# Patient Record
Sex: Male | Born: 1950 | Race: White | Hispanic: No | Marital: Married | Smoking: Never smoker
Health system: Southern US, Community
[De-identification: ages and names within clinical notes are randomized; demographics above are authoritative.]

## PROBLEM LIST (undated history)

## (undated) DIAGNOSIS — D509 Iron deficiency anemia, unspecified: Secondary | ICD-10-CM

## (undated) DIAGNOSIS — Z9889 Other specified postprocedural states: Secondary | ICD-10-CM

## (undated) DIAGNOSIS — R152 Fecal urgency: Secondary | ICD-10-CM

## (undated) DIAGNOSIS — D6862 Lupus anticoagulant syndrome: Secondary | ICD-10-CM

## (undated) DIAGNOSIS — I10 Essential (primary) hypertension: Secondary | ICD-10-CM

## (undated) DIAGNOSIS — M25619 Stiffness of unspecified shoulder, not elsewhere classified: Secondary | ICD-10-CM

## (undated) DIAGNOSIS — R112 Nausea with vomiting, unspecified: Secondary | ICD-10-CM

## (undated) DIAGNOSIS — K5010 Crohn's disease of large intestine without complications: Secondary | ICD-10-CM

## (undated) DIAGNOSIS — R194 Change in bowel habit: Secondary | ICD-10-CM

## (undated) DIAGNOSIS — M75100 Unspecified rotator cuff tear or rupture of unspecified shoulder, not specified as traumatic: Secondary | ICD-10-CM

## (undated) DIAGNOSIS — Z8739 Personal history of other diseases of the musculoskeletal system and connective tissue: Secondary | ICD-10-CM

## (undated) DIAGNOSIS — I82409 Acute embolism and thrombosis of unspecified deep veins of unspecified lower extremity: Secondary | ICD-10-CM

## (undated) DIAGNOSIS — K219 Gastro-esophageal reflux disease without esophagitis: Secondary | ICD-10-CM

## (undated) DIAGNOSIS — M1990 Unspecified osteoarthritis, unspecified site: Secondary | ICD-10-CM

## (undated) DIAGNOSIS — L309 Dermatitis, unspecified: Secondary | ICD-10-CM

## (undated) DIAGNOSIS — N200 Calculus of kidney: Secondary | ICD-10-CM

## (undated) DIAGNOSIS — J189 Pneumonia, unspecified organism: Secondary | ICD-10-CM

## (undated) DIAGNOSIS — M069 Rheumatoid arthritis, unspecified: Secondary | ICD-10-CM

## (undated) HISTORY — PX: JOINT REPLACEMENT: SHX530

## (undated) HISTORY — PX: COLECTOMY: SHX59

## (undated) HISTORY — PX: COLONOSCOPY: SHX174

## (undated) HISTORY — DX: Acute embolism and thrombosis of unspecified deep veins of unspecified lower extremity: I82.409

## (undated) HISTORY — PX: ESOPHAGOGASTRODUODENOSCOPY: SHX1529

## (undated) HISTORY — PX: THYROID SURGERY: SHX805

## (undated) HISTORY — DX: Lupus anticoagulant syndrome: D68.62

## (undated) HISTORY — PX: CYSTOSCOPY: SUR368

## (undated) HISTORY — PX: TRANSURETHRAL RESECTION OF PROSTATE: SHX73

## (undated) HISTORY — DX: Calculus of kidney: N20.0

---

## 1997-04-23 ENCOUNTER — Encounter: Admission: RE | Admit: 1997-04-23 | Discharge: 1997-07-22 | Payer: Self-pay

## 1997-06-09 ENCOUNTER — Inpatient Hospital Stay (HOSPITAL_COMMUNITY): Admission: AD | Admit: 1997-06-09 | Discharge: 1997-06-11 | Payer: Self-pay | Admitting: Internal Medicine

## 1997-06-10 ENCOUNTER — Other Ambulatory Visit: Admission: RE | Admit: 1997-06-10 | Discharge: 1997-06-10 | Payer: Self-pay | Admitting: Internal Medicine

## 1999-08-28 ENCOUNTER — Inpatient Hospital Stay (HOSPITAL_COMMUNITY): Admission: EM | Admit: 1999-08-28 | Discharge: 1999-08-31 | Payer: Self-pay | Admitting: Emergency Medicine

## 1999-08-28 ENCOUNTER — Encounter: Payer: Self-pay | Admitting: Emergency Medicine

## 1999-08-29 ENCOUNTER — Encounter: Payer: Self-pay | Admitting: Pediatrics

## 2001-06-16 ENCOUNTER — Emergency Department (HOSPITAL_COMMUNITY): Admission: EM | Admit: 2001-06-16 | Discharge: 2001-06-16 | Payer: Self-pay | Admitting: *Deleted

## 2004-12-31 ENCOUNTER — Inpatient Hospital Stay (HOSPITAL_COMMUNITY): Admission: EM | Admit: 2004-12-31 | Discharge: 2005-01-03 | Payer: Self-pay | Admitting: Emergency Medicine

## 2006-02-26 ENCOUNTER — Emergency Department (HOSPITAL_COMMUNITY): Admission: EM | Admit: 2006-02-26 | Discharge: 2006-02-26 | Payer: Self-pay | Admitting: Emergency Medicine

## 2009-01-11 HISTORY — PX: TOTAL KNEE ARTHROPLASTY: SHX125

## 2009-12-22 ENCOUNTER — Inpatient Hospital Stay (HOSPITAL_COMMUNITY)
Admission: RE | Admit: 2009-12-22 | Discharge: 2009-12-24 | Payer: Self-pay | Source: Home / Self Care | Attending: Orthopedic Surgery | Admitting: Orthopedic Surgery

## 2010-03-23 LAB — CBC
HCT: 36.3 % — ABNORMAL LOW (ref 39.0–52.0)
Hemoglobin: 8.4 g/dL — ABNORMAL LOW (ref 13.0–17.0)
Hemoglobin: 9.2 g/dL — ABNORMAL LOW (ref 13.0–17.0)
MCH: 31.9 pg (ref 26.0–34.0)
MCH: 32.4 pg (ref 26.0–34.0)
MCV: 90.5 fL (ref 78.0–100.0)
MCV: 92.8 fL (ref 78.0–100.0)
Platelets: 178 10*3/uL (ref 150–400)
Platelets: 182 10*3/uL (ref 150–400)
Platelets: 225 10*3/uL (ref 150–400)
RBC: 2.65 MIL/uL — ABNORMAL LOW (ref 4.22–5.81)
RBC: 2.88 MIL/uL — ABNORMAL LOW (ref 4.22–5.81)
RBC: 4.01 MIL/uL — ABNORMAL LOW (ref 4.22–5.81)
WBC: 11.1 10*3/uL — ABNORMAL HIGH (ref 4.0–10.5)
WBC: 6.1 10*3/uL (ref 4.0–10.5)
WBC: 8 10*3/uL (ref 4.0–10.5)

## 2010-03-23 LAB — BASIC METABOLIC PANEL
BUN: 16 mg/dL (ref 6–23)
CO2: 28 mEq/L (ref 19–32)
CO2: 29 mEq/L (ref 19–32)
Calcium: 8 mg/dL — ABNORMAL LOW (ref 8.4–10.5)
Chloride: 106 mEq/L (ref 96–112)
Chloride: 106 mEq/L (ref 96–112)
Creatinine, Ser: 0.96 mg/dL (ref 0.4–1.5)
Creatinine, Ser: 0.99 mg/dL (ref 0.4–1.5)
Creatinine, Ser: 1 mg/dL (ref 0.4–1.5)
GFR calc Af Amer: >60 mL/min (ref >60)
GFR calc Af Amer: >60 mL/min (ref >60)
GFR calc Af Amer: >60 mL/min (ref >60)
GFR calc non Af Amer: >60 mL/min (ref >60)
Potassium: 4.1 mEq/L (ref 3.5–5.1)
Sodium: 138 mEq/L (ref 135–145)
Sodium: 138 mEq/L (ref 135–145)

## 2010-03-23 LAB — URINALYSIS, ROUTINE W REFLEX MICROSCOPIC
Bilirubin Urine: NEGATIVE
Glucose, UA: NEGATIVE mg/dL
Hgb urine dipstick: NEGATIVE
Ketones, ur: NEGATIVE mg/dL
Nitrite: NEGATIVE
Protein, ur: NEGATIVE mg/dL
Specific Gravity, Urine: 1.013 (ref 1.005–1.030)
Urobilinogen, UA: 0.2 mg/dL (ref 0.0–1.0)
pH: 6 (ref 5.0–8.0)

## 2010-03-23 LAB — TYPE AND SCREEN
ABO/RH(D): B NEG
Antibody Screen: NEGATIVE

## 2010-03-23 LAB — DIFFERENTIAL
Eosinophils Absolute: 0.1 10*3/uL (ref 0.0–0.7)
Eosinophils Relative: 2 % (ref 0–5)
Lymphocytes Relative: 32 % (ref 12–46)
Lymphs Abs: 2 10*3/uL (ref 0.7–4.0)
Monocytes Absolute: 0.5 10*3/uL (ref 0.1–1.0)
Monocytes Relative: 7 % (ref 3–12)

## 2010-03-23 LAB — SURGICAL PCR SCREEN
MRSA, PCR: NEGATIVE
Staphylococcus aureus: POSITIVE — AB

## 2010-03-23 LAB — ABO/RH: ABO/RH(D): B NEG

## 2010-05-29 NOTE — Consult Note (Signed)
[facility]  Patient:    ZARED, KNOTH                          MRN: [id number] Proc. Date: 08/29/99 Adm. Date:  [id number] Attending:  Thedore Mins CC:         Helene Kelp, M.D.             Trudee Kuster, M.D.                          Consultation Report  CHIEF COMPLAINT:  Syncope.  HISTORY OF THE PRESENT CONDITION:  Mr. Costilla is a 60 year old right-handed married Caucasian gentleman who had an episode at dinner tonight of syncope. He said that he felt sweaty prior to this, was weak, with pallor, had a feeling of stiffening of his body and then lost consciousness.  A nurse at the table felt that his right face may have drooped and his eyes briefly deviated to the right.  After 20 to 30 seconds, the patient began to arouse.  This was followed by emesis.  The patient had had three episodes of diarrhea during the day but had not had voluminous stools and no emesis.  He had had a small amount of alcohol with dinner.  The patient did not have chest pain, shortness of breath, headache, nor did he have any neurologic signs and symptoms, either preceding or following the syncopal episode.  Patient recalls three episodes over the past couple of years; one occurred while he was in a movie theatre eating popcorn.  He felt sweaty and got up and got something to drink, felt light-headed and had to sit down but says that he did not pass out.  Another occurred when he was at a wedding in May of last year where he was profusely sweaty and hot and was standing.  He sat down and his symptoms passed.  There have been one or two other episodes; these have not been evaluated.  Patient has not had any chest pain.  He has not had intercurrent infections in the head, neck, lungs, GI or GU.  His appetite has been good and he has been sleeping well.  He has had no closed head injuries.  He has not had ongoing diarrhea or vomiting, urinary tract infections or  hematuria, hematemesis, melena or hematochezia.  Neurologically, he has had no weakness in his arms and legs, no numbness, no problems with swallowing, no double vision or change in vision or change in his hearing.  PAST MEDICAL HISTORY:  Remarkable for hypertension.  PAST SURGICAL HISTORY:  Negative.  CURRENT MEDICATIONS 1. Accupril 40 mg per day. 2. Cardura 4 mg per day. 3. He is also on potassium and a diuretic, whose names he does not recall.  ALLERGIES TO MEDICINES:  None known.  FAMILY HISTORY:  Patients father is age 20 and mother, age 65, fairly well. He has three brothers and one sister who are alive and well.  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  He does not have to load or unload the steel.  He is married.  His wife works for the psychiatric action team at [employer].  He has no children.  PHYSICAL EXAMINATION  GENERAL:  On examination today, this is a very pleasant gentleman, awake, alert and cooperative.  VITAL SIGNS:  Blood pressure 110/70, resting pulse  80, respirations 16.  He is afebrile.  HEENT/NECK:  No signs of infection.  Supple neck.  Full range of motion.  No cranial or cervical bruits.  LUNGS:  Clear to auscultation.  HEART:  No murmurs.  Pulses normal.  ABDOMEN:  Soft, nontender.  Bowel sounds normal.  EXTREMITIES:  Well-formed without edema, cyanosis, alterations in tone or tight heel cords.  NEUROLOGIC:  Mental status:  Awake, alert, attentive and appropriate.  No dysphagia or dyspraxia.  Cranial nerve examination:  Round reactive pupils. Normal fundi.  Full visual fields to double simultaneous stimuli.  Extraocular movements full and conjugate, okay and responses equal bilaterally.  Symmetric facial strength and sensation.  Air conduction greater than bone conduction bilaterally.  Motor examination:  Normal strength, tone and mass.  Good fine motor movements.  No pronator drift.   Sensation intact to cold, vibration and stereoagnosis.  Cerebellar examination:  Good finger-to-nose and rapid repetitive movements. No tremor, dystaxia, dysmetria.  Gait and station were normal.  IMPRESSION 1. Syncope, 780.2.  Patient may have had a left brain transient ischemic    attack with the right facial droop, although I could not explain the eyes    going to the right at that time.  It is possible that he had a seizure with    stiffening and that the facial droop was actually a grimace.  He could have    had an episode of neurocardiogenic syncope; this episode seems most    consistent with that, in that he had the premonitory warning of dizziness    and some nausea prior to losing consciousness. 2. Hypertension without congestive heart failure, 404.10.  I entirely agree with Dr. Phylliss Bob about this patients workup.  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  His lumbar puncture was negative. Nonetheless, I have to wonder whether or not he could have had a mild meningoencephalitis that now is causing problem ______ .  PLAN 1. MRI of brain without and with contrast. 2. MR angiography, intracranial. 3. EEG. 4. If the MRI suggests the presence of stroke, then the patient will need a    2-D echocardiogram and a carotid Doppler.  Syncope also needs to be    evaluated. Telemetry may help [place] with the prolonged Q-T interval; if not,    we may need to consider stress test and also look at a tilt table test for    possible neurocardiogenic syncope.  Patient will be seen by    Dr. Madaline Savage, who will decide if these are necessary or not.  I appreciate the opportunity to see the patient.  If you have questions or I can be of assistance, do not hesitate to contact me. Dr. Gustavus Messing. Orlin Hilding will see the patient for me this weekend and I will see him back on Monday.  The patient can have the  majority of his workup carried out as an outpatient,  depending upon his clinical situation.  I appreciate the opportunity to see him. DD:  08/29/99 TD:  08/30/99 Job: [id number]

## 2010-05-29 NOTE — Discharge Summary (Signed)
[facility]  Patient:    Dodson Dodson                          MRN: [id number] Adm. Date:  [id number] Disc. Date: [id number] Attending:  Thedore Mins                           Discharge Summary  DISCHARGE DIAGNOSES: 1. Syncope. 2. Hypertension. 3. Mild anemia.  HISTORY OF PRESENT ILLNESS:  Please see dictated history and physical per Dr. Phylliss Bob.  CONSULTANTS:  Neurology.  PROCEDURES:  MRI of the brain which was within normal limits.  HOSPITAL COURSE: #1 - SYNCOPE:  [name] had a syncopal episode, diaphoresis, weakness, and passing out.  This was witnessed by an ER nurse.  The patient did have an episode of emesis and was brought to the ER.  When evaluated in the emergency department, the patient had no focal neurologic deficits.  The patient had a CT that was negative.  His symptoms seemed to resolve and did not recur. Neurology, Dr. Ellison Carwin, was consulted.  MRI was ordered, and the patient had normal results.  The patients symptoms did not recur, so it is difficult to say if the patient had a vasovagal episode, a TIA, or simple faint.  The patient had no evidence of arrhythmia during his hospitalization. The patient was discharged to home in an improved condition.  It was felt that the patient could have further work-up as an outpatient.  #2 - HYPERTENSION:  The patient had moderately decreased blood pressure on admission.  The patient was on several blood pressure medicines.  He was on Accupril and Cardura.  Because his blood pressure was within normal limits while his medications were being held, it was decided to only give 1/2 the patients normal dose of Cardura.  CONDITION ON DISCHARGE:  Improved.  FOLLOW-UP:  The patient does not have a primary M.D., so he was given a Cardizem of Dr. Antonietta Breach with instructions to call for an appointment.  DISCHARGE MEDICATIONS: 1. Accupril 40 mg q.d. 2. HCTZ 12.5 mg q.d. 3. Kay Ciel 20  mEq q.d. 4. Aspirin 325 mg q.d. 5. Cardura 4 mg tablets 1/2 q.d. until evaluated by his primary doctor.  Discharge hemoglobin 13.0. DD:  09/21/99 TD:  09/21/99 Job: [id number]

## 2010-05-29 NOTE — H&P (Signed)
Frank Dodson, Frank Dodson NO.:  [id number]   MEDICAL RECORD NO.:  [id number]          PATIENT TYPE:  EMS   LOCATION:  MAJO                         FACILITY:  [facility]   PHYSICIAN:  Jonna L. Robb Matar, M.D.DATE OF BIRTH:  10-Apr-1950   DATE OF ADMISSION:  12/31/2004  DATE OF DISCHARGE:                                HISTORY & PHYSICAL   PRIMARY CARE PHYSICIAN:  Dr. Janey Greaser at [facility].  He is unassigned to [place].   CHIEF COMPLAINT:  Chest pain.   HISTORY:  This 60 year old Caucasian male got flu-like symptoms 4 days ago  with low-grade aches, chills, fever intermittently, and he was taking over-  the-counter medication for it, and continued to take his regular medicine.  Today, he started to get wheezing, chest tightness, increased shortness of  breath, cough __________ nonproductive, and he came to the emergency room.   ALLERGIES:  None.   MEDICATIONS:  1.  Potassium 20.  2.  Doxazosin 4.  3.  Protonix.  4.  Clinoretic 20/25.   PAST MEDICAL HISTORY:  1.  Hypertension.  2.  Gastroesophageal reflux disease.  3.  Dislocation of the left shoulder.   OPERATIONS:  Surgery on a cyst in his neck at ages 16, 74 and 69.   FAMILY HISTORY:  Hypertension, heart disease.   SOCIAL HISTORY:  Nonsmoker, no drugs.  Drinks a 12-pack a week.  Married.  No children.  Works as a Animator route.   REVIEW OF SYSTEMS:  He has a sore throat, chest tightness.  He had  peripheral edema prior to being treated for his hypertension.  All other  systems were reviewed and were negative.   PHYSICAL EXAMINATION:  VITAL SIGNS:  Temperature 100.4, pulse 130 to 114,  respirations 22, blood pressure 108/67 and went down to 89/53, 91%  saturation.  Blood pressure is back up now to 114/80.  GENERAL:  Well-developed Caucasian male in mild respiratory distress.  HEENT:  Conjunctivae and lids are normal.  Extraocular movements are full.  Normal hearing, mucosa  and pharynx.  NECK:  Supple without masses, thyromegaly or carotid bruits.  There is a  scar.  LUNGS:  Slightly increased respiratory effort.  The lungs had a few  scattered expiratory wheezes.  No dullness.  HEART:  Regular rate and rhythm.  Tachycardic.  Normal S1 and S2 without  murmurs, rubs, or gallops.  There was no clubbing, cyanosis, or edema.  ABDOMEN:  Nontender.  Normal bowel sounds.  No hepatosplenomegaly right  hernia.  GENITOURINARY:  Normal external scrotum and penis.  No adenopathy.  NEUROLOGIC:  Muscle strength is 5/5, with full range of motion in all 4  extremities.  SKIN:  No rashes, lesions or nodules.  CARDIOVASCULAR:  Intact.  EXTREMITIES:  DTRs are 2+.  Normal sensation.  Alert and oriented x3.  Normal memory, judgment and affect.   LABORATORY DATA:  Chest x-ray shows chronic peribronchitic changes.  No  congestive heart failure or infiltrate.  BUN was 25, creatinine up to 3.1,  potassium 3.5, white count of  6.5, hemoglobin 11.6.   IMPRESSION:  1.  Asthmatic bronchitis.  It is hard to tell from his white count whether      he has a bacterial on top of a viral, or whether it is an atypical, but,      in any case, will cover him with Rocephin and azithromycin.  2.  Dehydration that lead to some mild hypotension.  The patient will be      rehydrated.  3.  Acute renal failure.  4.  History of hypertension.      Jonna L. Robb Matar, M.D.  Electronically Signed     JLB/MEDQ  D:  12/31/2004  T:  01/02/2005  Job:  [id number]

## 2010-05-29 NOTE — Discharge Summary (Signed)
NAMEIMAAD, REUSS NO.:  [id number]   MEDICAL RECORD NO.:  [id number]          PATIENT TYPE:  INP   LOCATION:  5705                         FACILITY:  [facility]   PHYSICIAN:  Danae Chen, M.D.DATE OF BIRTH:  January 15, 1950   DATE OF ADMISSION:  12/31/2004  DATE OF DISCHARGE:  01/03/2005                                 DISCHARGE SUMMARY   PRIMARY CARE PHYSICIAN:  The patient's primary care physician is Dr. Janey Greaser  at [facility].   DISCHARGE DIAGNOSES:  1.  Asthmatic bronchitis.  2.  Acute renal insufficiency.  3.  Iron deficient anemia.  4.  History of hypertension.  5.  Dehydration.   DISCHARGE MEDICATIONS:  1.  Potassium 20 meq p.o. q day.  2.  Doxazosin 4 mg p.o. q day.  3.  Protonix 40 mg p.o. daily.  4.  __________ 20/25 mg p.o. daily.   FOLLOW UP:  With Dr. Janey Greaser as needed.   NEW MEDICATIONS GIVEN:  Avalox 400 mg p.o. daily x3 days to complete a 7 day  course of antibiotics.   BRIEF HOSPITAL COURSE:  The patient is a 60 year old gentleman who was  admitted with cough, shortness of breath and low grade fever. The patient  was diagnosed with asthmatic bronchitis based on symptoms, was started  empirically on IV antibiotics. He defervesced, his breathing improved. The  patient also was found to have clinical evidence of dehydration, as well as  acute renal insufficiency secondary to prerenal hypertension most likely.  Initial BUN and creatinine were at 25 and 3.1. With hydration, the patient's  renal function did improve and at the time of discharge, the BUN was 10 and  creatinine was 1.2. the patient was also found to have iron deficiency  anemia with a low iron at 23, TIBC of 210. Percent sat of 11%. The patient  was told to follow up with Dr. Janey Greaser for referral for a work up as an  outpatient for this.   CONSULTATIONS:  None.   PROCEDURE:  No procedure.   CONDITION ON DISCHARGE:  Improved.      Danae Chen,  M.D.     RLK/MEDQ  D:  03/08/2005  T:  03/08/2005  Job:  [id number]   cc:   Dr. Janey Greaser

## 2011-11-09 ENCOUNTER — Other Ambulatory Visit: Payer: Self-pay | Admitting: Gastroenterology

## 2011-11-12 ENCOUNTER — Ambulatory Visit
Admission: RE | Admit: 2011-11-12 | Discharge: 2011-11-12 | Disposition: A | Discharge status: Home / Self Care | Payer: BC Managed Care – PPO | Source: Ambulatory Visit | Attending: Gastroenterology | Admitting: Gastroenterology

## 2012-03-13 ENCOUNTER — Other Ambulatory Visit: Payer: Self-pay | Admitting: Gastroenterology

## 2012-03-13 DIAGNOSIS — K5090 Crohn's disease, unspecified, without complications: Secondary | ICD-10-CM

## 2012-03-23 ENCOUNTER — Ambulatory Visit
Admission: RE | Admit: 2012-03-23 | Discharge: 2012-03-23 | Disposition: A | Discharge status: Home / Self Care | Payer: BC Managed Care – PPO | Source: Ambulatory Visit | Attending: Gastroenterology | Admitting: Gastroenterology

## 2012-03-23 DIAGNOSIS — K5090 Crohn's disease, unspecified, without complications: Secondary | ICD-10-CM

## 2012-03-23 MED ORDER — IOHEXOL 300 MG/ML  SOLN
100.0000 mL | Freq: Once | INTRAMUSCULAR | Status: AC | PRN
  Administered 2012-03-23: 100 mL via INTRAVENOUS

## 2013-06-27 ENCOUNTER — Encounter (HOSPITAL_COMMUNITY): Payer: Self-pay | Admitting: Pharmacy Technician

## 2013-06-28 ENCOUNTER — Encounter (HOSPITAL_COMMUNITY): Payer: Self-pay

## 2013-06-28 ENCOUNTER — Ambulatory Visit (HOSPITAL_COMMUNITY)
Admission: RE | Admit: 2013-06-28 | Discharge: 2013-06-28 | Disposition: A | Discharge status: Home / Self Care | Payer: BC Managed Care – PPO | Source: Ambulatory Visit | Attending: Orthopedic Surgery | Admitting: Orthopedic Surgery

## 2013-06-28 ENCOUNTER — Encounter (HOSPITAL_COMMUNITY)
Admission: RE | Admit: 2013-06-28 | Discharge: 2013-06-28 | Disposition: A | Discharge status: Home / Self Care | Payer: BC Managed Care – PPO | Source: Ambulatory Visit | Attending: Orthopedic Surgery | Admitting: Orthopedic Surgery

## 2013-06-28 DIAGNOSIS — Z01810 Encounter for preprocedural cardiovascular examination: Secondary | ICD-10-CM | POA: Insufficient documentation

## 2013-06-28 DIAGNOSIS — M47814 Spondylosis without myelopathy or radiculopathy, thoracic region: Secondary | ICD-10-CM | POA: Insufficient documentation

## 2013-06-28 DIAGNOSIS — Z01818 Encounter for other preprocedural examination: Secondary | ICD-10-CM | POA: Insufficient documentation

## 2013-06-28 DIAGNOSIS — Z01812 Encounter for preprocedural laboratory examination: Secondary | ICD-10-CM | POA: Insufficient documentation

## 2013-06-28 HISTORY — DX: Gastro-esophageal reflux disease without esophagitis: K21.9

## 2013-06-28 HISTORY — DX: Change in bowel habit: R19.4

## 2013-06-28 HISTORY — DX: Crohn's disease of large intestine without complications: K50.10

## 2013-06-28 HISTORY — DX: Essential (primary) hypertension: I10

## 2013-06-28 LAB — URINE MICROSCOPIC-ADD ON

## 2013-06-28 LAB — SURGICAL PCR SCREEN
MRSA, PCR: NEGATIVE
Staphylococcus aureus: POSITIVE — AB

## 2013-06-28 LAB — URINALYSIS, ROUTINE W REFLEX MICROSCOPIC
Bilirubin Urine: NEGATIVE
GLUCOSE, UA: NEGATIVE mg/dL
HGB URINE DIPSTICK: NEGATIVE
Ketones, ur: NEGATIVE mg/dL
NITRITE: POSITIVE — AB
Protein, ur: NEGATIVE mg/dL
SPECIFIC GRAVITY, URINE: 1.018 (ref 1.005–1.030)
Urobilinogen, UA: 0.2 mg/dL (ref 0.0–1.0)
pH: 5.5 (ref 5.0–8.0)

## 2013-06-28 LAB — BASIC METABOLIC PANEL
BUN: 11 mg/dL (ref 6–23)
CHLORIDE: 105 meq/L (ref 96–112)
CO2: 25 mEq/L (ref 19–32)
Calcium: 9.2 mg/dL (ref 8.4–10.5)
Creatinine, Ser: 1.02 mg/dL (ref 0.50–1.35)
GFR calc Af Amer: 90 mL/min — ABNORMAL LOW (ref >90)
GFR calc non Af Amer: 77 mL/min — ABNORMAL LOW (ref >90)
GLUCOSE: 93 mg/dL (ref 70–99)
Potassium: 3.6 mEq/L — ABNORMAL LOW (ref 3.7–5.3)
Sodium: 142 mEq/L (ref 137–147)

## 2013-06-28 LAB — CBC
HEMATOCRIT: 35.4 % — AB (ref 39.0–52.0)
HEMOGLOBIN: 12 g/dL — AB (ref 13.0–17.0)
MCH: 32.4 pg (ref 26.0–34.0)
MCHC: 33.9 g/dL (ref 30.0–36.0)
MCV: 95.7 fL (ref 78.0–100.0)
Platelets: 204 10*3/uL (ref 150–400)
RBC: 3.7 MIL/uL — ABNORMAL LOW (ref 4.22–5.81)
RDW: 13.2 % (ref 11.5–15.5)
WBC: 7.9 10*3/uL (ref 4.0–10.5)

## 2013-06-28 LAB — PROTIME-INR
INR: 1.02 (ref 0.00–1.49)
Prothrombin Time: 13.2 seconds (ref 11.6–15.2)

## 2013-06-28 LAB — APTT: aPTT: 38 seconds — ABNORMAL HIGH (ref 24–37)

## 2013-06-28 NOTE — Pre-Procedure Instructions (Addendum)
[id number] Labs viewable in Epic-note PTT, urinalysis. Pt. Has  Positive Staph aureus PCR -Mupirocin will be called to [place], [place]. Pt. To be notified to use as directed. 06-28-13 1530 Pt. Call to say he spoke with Dr. Aurea Graff PA in reference to Mesalamine "he didn't see a problem with him taking". Pt. Desires and will take AM of surgery 07-10-13.

## 2013-06-28 NOTE — H&P (Signed)
TOTAL KNEE ADMISSION H&P  Patient is being admitted for right total knee arthroplasty.  Subjective:  Chief Complaint:right knee pain.  HPI: Frank Dodson, 63 y.o. male, has a history of pain and functional disability in the right knee due to arthritis and has failed non-surgical conservative treatments for greater than 12 weeks to includeNSAID's and/or analgesics, corticosteriod injections, viscosupplementation injections and activity modification.  Onset of symptoms was gradual, starting 6 years ago with gradually worsening course since that time. The patient noted prior procedures on the knee to include  arthroplasty on the left knee in 2011 per Dr. Alvan Dame.  Patient currently rates pain in the right knee(s) at 6 out of 10 with activity. Patient has worsening of pain with activity and weight bearing, pain that interferes with activities of daily living, pain with passive range of motion, crepitus and joint swelling.  Patient has evidence of periarticular osteophytes and joint space narrowing by imaging studies.  There is no active infection.  Risks, benefits and expectations were discussed with the patient.  Risks including but not limited to the risk of anesthesia, blood clots, nerve damage, blood vessel damage, failure of the prosthesis, infection and up to and including death.  Patient understand the risks, benefits and expectations and wishes to proceed with surgery.   PCP: Gerrit Heck, MD  D/C Plans:      Home with HHPT  Post-op Meds:       No Rx given  Tranexamic Acid:      Is to be given  Decadron:      Is to be given  FYI:     ASA post-op  Norco post-op    Past Medical History  Diagnosis Date  . Crohn's colitis     06-28-13 under treatment for recent flare up-  . Frequent bowel movements     06-28-13  extreme urgency with bowel movements "3-5" per day or more  . Hypertension   . GERD (gastroesophageal reflux disease)   . Arthritis     Rheumatoid , osteoarthritis     Past Surgical History  Procedure Laterality Date  . Cystoscopy      "clean out of uric acid deposits"  . Joint replacement      LTKA '11  . 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 Smoking status: Never Smoker   . Smokeless tobacco: Not on file  . Alcohol Use: Yes     Comment: rare -social       Review of Systems  Constitutional: Negative.   HENT: Negative.   Eyes: Negative.   Respiratory: Negative.   Cardiovascular: Negative.   Gastrointestinal: Positive for heartburn and diarrhea.  Musculoskeletal: Positive for joint pain.  Skin: Negative.   Neurological: Negative.   Endo/Heme/Allergies: Negative.   Psychiatric/Behavioral: Negative.     Objective:  Physical Exam  Constitutional: He is oriented to person, place, and time. He appears well-developed and well-nourished.  HENT:  Head: Normocephalic and atraumatic.  Mouth/Throat: Oropharynx is clear and moist.  Eyes: Pupils are equal, round, and reactive to light.  Neck: Neck supple. No JVD present. No tracheal deviation present. No thyromegaly present.  Cardiovascular: Normal rate, regular rhythm, normal heart sounds and intact distal pulses.   Respiratory: Effort normal and breath sounds normal. No stridor. No respiratory distress. He has no  wheezes.  GI: Soft. There is no tenderness. There is no guarding.  Musculoskeletal:       Right knee: He exhibits decreased range of motion, swelling and bony tenderness. He exhibits no ecchymosis, no deformity, no laceration and no erythema. Tenderness found.  Lymphadenopathy:    He has no cervical adenopathy.  Neurological: He is alert and oriented to person, place, and time.  Skin: Skin is warm and dry.  Psychiatric: He has a normal mood and affect.    Vital signs in last 24 hours: Temp:  [98 F (36.7 C)] 98 F (36.7 C) (06/18  0836) Pulse Rate:  [78] 78 (06/18 0836) Resp:  [18] 18 (06/18 0836) BP: (119)/(72) 119/72 mmHg (06/18 0836) SpO2:  [97 %] 97 % (06/18 0836) Weight:  [79.89 kg (176 lb 2 oz)] 79.89 kg (176 lb 2 oz) (06/18 0836)    Imaging Review Plain radiographs demonstrate severe degenerative joint disease of the right knee(s). The overall alignment is neutral. The bone quality appears to be good for age and reported activity level.  Assessment/Plan:  End stage arthritis, right knee   The patient history, physical examination, clinical judgment of the provider and imaging studies are consistent with end stage degenerative joint disease of the right knee(s) and total knee arthroplasty is deemed medically necessary. The treatment options including medical management, injection therapy arthroscopy and arthroplasty were discussed at length. The risks and benefits of total knee arthroplasty were presented and reviewed. The risks due to aseptic loosening, infection, stiffness, patella tracking problems, thromboembolic complications and other imponderables were discussed. The patient acknowledged the explanation, agreed to proceed with the plan and consent was signed. Patient is being admitted for inpatient treatment for surgery, pain control, PT, OT, prophylactic antibiotics, VTE prophylaxis, progressive ambulation and ADL's and discharge planning. The patient is planning to be discharged home with home health services.      [place] Greb   PA-C  06/28/2013, 3:26 PM

## 2013-06-28 NOTE — Pre-Procedure Instructions (Addendum)
06-28-13 EKG/ CXR done today. 06-28-13 0920 Dr. Winfred Leeds made  aware Of Mesalamine use and pt's desire not to stop med or not take AM of- pt. Informed to make Dr. Alvan Dame aware he has not stopped  and  desire to take med even AM of surgery.

## 2013-06-28 NOTE — Patient Instructions (Addendum)
[place]  06/28/2013   Your procedure is scheduled on: 6-30  -2015  Enter through [facility] Entrance and follow signs to [facility]. Arrive at        0700 AM .  Call this number if you have problems the morning of surgery: [phone]  Or Presurgical Testing [phone](Frank Dodson) For Living Will and/or Health Care Power Attorney Forms: please provide copy for your medical record,may bring AM of surgery(Forms should be already notarized -we do not provide this service).(06-28-13  No information preferred today, wife is legal POA -document included with trust).     Do not eat food:After Midnight.    Take these medicines the morning of surgery with A SIP OF WATER: Allopurinol. Colchicine/ Prednisone(if applies). Tramadol. Floraster. Vancomycin. Nexium.   Do not wear jewelry, make-up or nail polish.  Do not wear lotions, powders, or perfumes. You may wear deodorant.  Do not shave 48 hours(2 days) prior to first CHG shower(legs and under arms).(Shaving face and neck okay.)  Do not bring valuables to the hospital.(Hospital is not responsible for lost valuables).  Contacts, dentures or removable bridgework, body piercing, hair pins may not be worn into surgery.  Leave suitcase in the car. After surgery it may be brought to your room.  For patients admitted to the hospital, checkout time is 11:00 AM the day of discharge.(Restricted visitors-Any Persons displaying flu-like symptoms or illness).    Patients discharged the day of surgery will not be allowed to drive home. Must have responsible person with you x 24 hours once discharged.  Name and phone number of your driver: Frank Dodson [phone] h  Special Instructions: CHG(Chlorhedine 4%-"Hibiclens","Betasept","Aplicare") Shower Use Special Wash: see special instructions.(avoid face and genitals)   Please read over the following fact sheets that you were given: MRSA Information, Blood Transfusion fact sheet,  Incentive Spirometry Instruction.  Remember : Type/Screen "Blue armbands" - may not be removed once applied(would result in being retested AM of surgery, if removed).  Failure to follow these instructions may result in Cancellation of your surgery.   ______                                                  [facility] - Preparing for Surgery Before surgery, you can play an important role.  Because skin is not sterile, your skin needs to be as free of germs as possible.  You can reduce the number of germs on your skin by washing with CHG (chlorahexidine gluconate) soap before surgery.  CHG is an antiseptic cleaner which kills germs and bonds with the skin to continue killing germs even after washing. Please DO NOT use if you have an allergy to CHG or antibacterial soaps.  If your skin becomes reddened/irritated stop using the CHG and inform your nurse when you arrive at Short Stay. Do not shave (including legs and underarms) for at least 48 hours prior to the first CHG shower.  You may shave your face/neck. Please follow these instructions carefully:  1.  Shower with CHG Soap the night before surgery and the  morning of Surgery.  2.  If you choose to wash your hair, wash your hair first as usual with your  normal  shampoo.  3.  After you shampoo, rinse your hair and body thoroughly to remove the  shampoo.  4.  Use CHG as you would any other liquid soap.  You can apply chg directly  to the skin and wash                       Gently with a scrungie or clean washcloth.  5.  Apply the CHG Soap to your body ONLY FROM THE NECK DOWN.   Do not use on face/ open                           Wound or open sores. Avoid contact with eyes, ears mouth and genitals (private parts).                       Wash face,  Genitals (private parts) with your normal soap.             6.  Wash thoroughly, paying special attention to the area where your surgery  will be performed.  7.  Thoroughly rinse  your body with warm water from the neck down.  8.  DO NOT shower/wash with your normal soap after using and rinsing off  the CHG Soap.                9.  Pat yourself dry with a clean towel.            10.  Wear clean pajamas.            11.  Place clean sheets on your bed the night of your first shower and do not  sleep with pets. Day of Surgery : Do not apply any lotions/deodorants the morning of surgery.  Please wear clean clothes to the hospital/surgery center.  FAILURE TO FOLLOW THESE INSTRUCTIONS MAY RESULT IN THE CANCELLATION OF YOUR SURGERY PATIENT SIGNATURE_________________________________  NURSE SIGNATURE__________________________________  ________________________________________________________________________   Frank Dodson  An incentive spirometer is a tool that can help keep your lungs clear and active. This tool measures how well you are filling your lungs with each breath. Taking long deep breaths may help reverse or decrease the chance of developing breathing (pulmonary) problems (especially infection) following:  A long period of time when you are unable to move or be active. BEFORE THE PROCEDURE   If the spirometer includes an indicator to show your best effort, your nurse or respiratory therapist will set it to a desired goal.  If possible, sit up straight or lean slightly forward. Try not to slouch.  Hold the incentive spirometer in an upright position. INSTRUCTIONS FOR USE  1. Sit on the edge of your bed if possible, or sit up as far as you can in bed or on a chair. 2. Hold the incentive spirometer in an upright position. 3. Breathe out normally. 4. Place the mouthpiece in your mouth and seal your lips tightly around it. 5. Breathe in slowly and as deeply as possible, raising the piston or the ball toward the top of the column. 6. Hold your breath for 3-5 seconds or for as long as possible. Allow the piston or ball to fall to the bottom of the  column. 7. Remove the mouthpiece from your mouth and breathe out normally. 8. Rest for a few seconds and repeat Steps 1 through 7 at least 10 times every 1-2 hours when you are awake. Take your time and take a few normal breaths between deep breaths. 9. The spirometer may include an indicator to  show your best effort. Use the indicator as a goal to work toward during each repetition. 10. After each set of 10 deep breaths, practice coughing to be sure your lungs are clear. If you have an incision (the cut made at the time of surgery), support your incision when coughing by placing a pillow or rolled up towels firmly against it. Once you are able to get out of bed, walk around indoors and cough well. You may stop using the incentive spirometer when instructed by your caregiver.  RISKS AND COMPLICATIONS  Take your time so you do not get dizzy or light-headed.  If you are in pain, you may need to take or ask for pain medication before doing incentive spirometry. It is harder to take a deep breath if you are having pain. AFTER USE  Rest and breathe slowly and easily.  It can be helpful to keep track of a log of your progress. Your caregiver can provide you with a simple table to help with this. If you are using the spirometer at home, follow these instructions: [place] IF:   You are having difficultly using the spirometer.  You have trouble using the spirometer as often as instructed.  Your pain medication is not giving enough relief while using the spirometer.  You develop fever of 100.5 F (38.1 C) or higher. SEEK IMMEDIATE MEDICAL CARE IF:   You cough up bloody sputum that had not been present before.  You develop fever of 102 F (38.9 C) or greater.  You develop worsening pain at or near the incision site. MAKE SURE YOU:   Understand these instructions.  Will watch your condition.  Will get help right away if you are not doing well or get worse. Document Released:  05/10/2006 Document Revised: 03/22/2011 Document Reviewed: 07/11/2006 ExitCare Patient Information 2014 ExitCare, [place].   ________________________________________________________________________  WHAT IS A BLOOD TRANSFUSION? Blood Transfusion Information  A transfusion is the replacement of blood or some of its parts. Blood is made up of multiple cells which provide different functions.  Red blood cells carry oxygen and are used for blood loss replacement.  White blood cells fight against infection.  Platelets control bleeding.  Plasma helps clot blood.  Other blood products are available for specialized needs, such as hemophilia or other clotting disorders. BEFORE THE TRANSFUSION  Who gives blood for transfusions?   Healthy volunteers who are fully evaluated to make sure their blood is safe. This is blood bank blood. Transfusion therapy is the safest it has ever been in the practice of medicine. Before blood is taken from a donor, a complete history is taken to make sure that person has no history of diseases nor engages in risky social behavior (examples are intravenous drug use or sexual activity with multiple partners). The donor's travel history is screened to minimize risk of transmitting infections, such as malaria. The donated blood is tested for signs of infectious diseases, such as HIV and hepatitis. The blood is then tested to be sure it is compatible with you in order to minimize the chance of a transfusion reaction. If you or a relative donates blood, this is often done in anticipation of surgery and is not appropriate for emergency situations. It takes many days to process the donated blood. RISKS AND COMPLICATIONS Although transfusion therapy is very safe and saves many lives, the main dangers of transfusion include:   Getting an infectious disease.  Developing a transfusion reaction. This is an allergic reaction to  something in the blood you were given. Every precaution  is taken to prevent this. The decision to have a blood transfusion has been considered carefully by your caregiver before blood is given. Blood is not given unless the benefits outweigh the risks. AFTER THE TRANSFUSION  Right after receiving a blood transfusion, you will usually feel much better and more energetic. This is especially true if your red blood cells have gotten low (anemic). The transfusion raises the level of the red blood cells which carry oxygen, and this usually causes an energy increase.  The nurse administering the transfusion will monitor you carefully for complications. HOME CARE INSTRUCTIONS  No special instructions are needed after a transfusion. You may find your energy is better. Speak with your caregiver about any limitations on activity for underlying diseases you may have. SEEK MEDICAL CARE IF:   Your condition is not improving after your transfusion.  You develop redness or irritation at the intravenous (IV) site. SEEK IMMEDIATE MEDICAL CARE IF:  Any of the following symptoms occur over the next 12 hours:  Shaking chills.  You have a temperature by mouth above 102 F (38.9 C), not controlled by medicine.  Chest, back, or muscle pain.  People around you feel you are not acting correctly or are confused.  Shortness of breath or difficulty breathing.  Dizziness and fainting.  You get a rash or develop hives.  You have a decrease in urine output.  Your urine turns a dark color or changes to pink, red, or brown. Any of the following symptoms occur over the next 10 days:  You have a temperature by mouth above 102 F (38.9 C), not controlled by medicine.  Shortness of breath.  Weakness after normal activity.  The white part of the eye turns yellow (jaundice).  You have a decrease in the amount of urine or are urinating less often.  Your urine turns a dark color or changes to pink, red, or brown. Document Released: 12/26/1999 Document Revised:  03/22/2011 Document Reviewed: 08/14/2007 [facility] Patient Information 2014 [place], [place].  _______________________________________________________________________

## 2013-07-10 ENCOUNTER — Encounter (HOSPITAL_COMMUNITY): Admission: RE | Payer: Self-pay | Source: Ambulatory Visit

## 2013-07-10 ENCOUNTER — Inpatient Hospital Stay (HOSPITAL_COMMUNITY)
Admission: RE | Admit: 2013-07-10 | Payer: BC Managed Care – PPO | Source: Ambulatory Visit | Admitting: Orthopedic Surgery

## 2013-07-10 SURGERY — ARTHROPLASTY, KNEE, TOTAL
Anesthesia: Spinal | Site: Knee | Laterality: Right

## 2013-08-22 ENCOUNTER — Encounter (HOSPITAL_COMMUNITY): Payer: Self-pay | Admitting: Pharmacy Technician

## 2013-08-22 NOTE — Patient Instructions (Addendum)
Frank Dodson  08/22/2013                           YOUR PROCEDURE IS SCHEDULED ON: 09/04/13               [facility]RU  [facility] AND                            FOLLOW  SIGNS TO SHORT STAY CENTER                 ARRIVE AT SHORT STAY AT:  12:45 pm               CALL THIS NUMBER IF ANY PROBLEMS THE DAY OF SURGERY :               [phone]                                REMEMBER:   Do not eat food  AFTER MIDNIGHT             MAY HAVE CLEAR LIQUIDS UNTIL 6 HRS PRE OP ( 9:45 AM)               CLEAR LIQUID DIET   Foods Allowed                                                                     Foods Excluded  Coffee and tea, regular and decaf                             liquids that you cannot  Plain Jell-O in any flavor                                             see through such as: Fruit ices (not with fruit pulp)                                     milk, soups, orange juice  Iced Popsicles                                    All solid food Carbonated beverages, regular and diet                                    Cranberry, grape and apple juices Sports drinks like Gatorade Lightly seasoned clear broth or consume(fat free) Sugar, honey syrup  _____________________________________________________________________                Take these medicines the morning of surgery with               A SIPS OF WATER :  VANCOMYCIN / TRAMADOL /  COLCRYS / ALLOPURINOL / PREDNISONE / NEXIUM / LIALDA / FLORASTOR       Do not wear jewelry, make-up   Do not wear lotions, powders, or perfumes.   Do not shave legs or underarms 12 hrs. before surgery (men may shave face)  Do not bring valuables to the hospital.  Contacts, dentures or bridgework may not be worn into surgery.  Leave suitcase in the car. After surgery it may be brought to your room.  For patients admitted to the hospital more than one night, checkout time is            11:00 AM                                                          ________________________________________________________________________                                                         Frank Dodson  Before surgery, you can play an important role.  Because skin is not sterile, your skin needs to be as free of germs as possible.  You can reduce the number of germs on your skin by washing with CHG (chlorahexidine gluconate) soap before surgery.  CHG is an antiseptic cleaner which kills germs and bonds with the skin to continue killing germs even after washing. Please DO NOT use if you have an allergy to CHG or antibacterial soaps.  If your skin becomes reddened/irritated stop using the CHG and inform your nurse when you arrive at Short Stay. Do not shave (including legs and underarms) for at least 48 hours prior to the first CHG shower.  You may shave your face. Please follow these instructions carefully:   1.  Shower with CHG Soap the night before surgery and the  morning of Surgery.   2.  If you choose to wash your hair, wash your hair first as usual with your  normal  Shampoo.   3.  After you shampoo, rinse your hair and body thoroughly to remove the  shampoo.                                         4.  Use CHG as you would any other liquid soap.  You can apply chg directly  to the skin and wash . Gently wash with scrungie or clean wascloth    5.  Apply the CHG Soap to your body ONLY FROM THE NECK DOWN.   Do not use on open                           Wound or open sores. Avoid contact with eyes, ears mouth and genitals (private parts).                        Genitals (private parts) with your normal soap.              6.  Wash thoroughly, paying  special attention to the area where your surgery  will be performed.   7.  Thoroughly rinse your body with warm water from the neck down.   8.  DO NOT shower/wash with your normal soap after using and rinsing off  the CHG Soap .                 9.  Pat yourself dry with a clean towel.             10.  Wear clean pajamas.             11.  Place clean sheets on your bed the night of your first shower and do not  sleep with pets.  Day of Surgery : Do not apply any lotions/deodorants the morning of surgery.  Please wear clean clothes to the hospital/surgery center.  FAILURE TO FOLLOW THESE INSTRUCTIONS MAY RESULT IN THE CANCELLATION OF YOUR SURGERY    PATIENT SIGNATURE_________________________________  ______________________________________________________________________     Frank Dodson  An incentive spirometer is a tool that can help keep your lungs clear and active. This tool measures how well you are filling your lungs with each breath. Taking long deep breaths may help reverse or decrease the chance of developing breathing (pulmonary) problems (especially infection) following:  A long period of time when you are unable to move or be active. BEFORE THE PROCEDURE   If the spirometer includes an indicator to show your best effort, your nurse or respiratory therapist will set it to a desired goal.  If possible, sit up straight or lean slightly forward. Try not to slouch.  Hold the incentive spirometer in an upright position. INSTRUCTIONS FOR USE  1. Sit on the edge of your bed if possible, or sit up as far as you can in bed or on a chair. 2. Hold the incentive spirometer in an upright position. 3. Breathe out normally. 4. Place the mouthpiece in your mouth and seal your lips tightly around it. 5. Breathe in slowly and as deeply as possible, raising the piston or the ball toward the top of the column. 6. Hold your breath for 3-5 seconds or for as long as possible. Allow the piston or ball to fall to the bottom of the column. 7. Remove the mouthpiece from your mouth and breathe out normally. 8. Rest for a few seconds and repeat Steps 1 through 7 at least 10 times every 1-2 hours when you are awake. Take your  time and take a few normal breaths between deep breaths. 9. The spirometer may include an indicator to show your best effort. Use the indicator as a goal to work toward during each repetition. 10. After each set of 10 deep breaths, practice coughing to be sure your lungs are clear. If you have an incision (the cut made at the time of surgery), support your incision when coughing by placing a pillow or rolled up towels firmly against it. Once you are able to get out of bed, walk around indoors and cough well. You may stop using the incentive spirometer when instructed by your caregiver.  RISKS AND COMPLICATIONS  Take your time so you do not get dizzy or light-headed.  If you are in pain, you may need to take or ask for pain medication before doing incentive spirometry. It is harder to take a deep breath if you are having pain. AFTER USE  Rest and breathe slowly and easily.  It can be helpful to keep track of  a log of your progress. Your caregiver can provide you with a simple table to help with this. If you are using the spirometer at home, follow these instructions: [place] IF:   You are having difficultly using the spirometer.  You have trouble using the spirometer as often as instructed.  Your pain medication is not giving enough relief while using the spirometer.  You develop fever of 100.5 F (38.1 C) or higher. SEEK IMMEDIATE MEDICAL CARE IF:   You cough up bloody sputum that had not been present before.  You develop fever of 102 F (38.9 C) or greater.  You develop worsening pain at or near the incision site. MAKE SURE YOU:   Understand these instructions.  Will watch your condition.  Will get help right away if you are not doing well or get worse. Document Released: 05/10/2006 Document Revised: 03/22/2011 Document Reviewed: 07/11/2006 Exit[employer] Patient Information 2014 ExitCare,  [place].   ________________________________________________________________________  WHAT IS A BLOOD TRANSFUSION? Blood Transfusion Information  A transfusion is the replacement of blood or some of its parts. Blood is made up of multiple cells which provide different functions.  Red blood cells carry oxygen and are used for blood loss replacement.  White blood cells fight against infection.  Platelets control bleeding.  Plasma helps clot blood.  Other blood products are available for specialized needs, such as hemophilia or other clotting disorders. BEFORE THE TRANSFUSION  Who gives blood for transfusions?   Healthy volunteers who are fully evaluated to make sure their blood is safe. This is blood bank blood. Transfusion therapy is the safest it has ever been in the practice of medicine. Before blood is taken from a donor, a complete history is taken to make sure that person has no history of diseases nor engages in risky social behavior (examples are intravenous drug use or sexual activity with multiple partners). The donor's travel history is screened to minimize risk of transmitting infections, such as malaria. The donated blood is tested for signs of infectious diseases, such as HIV and hepatitis. The blood is then tested to be sure it is compatible with you in order to minimize the chance of a transfusion reaction. If you or a relative donates blood, this is often done in anticipation of surgery and is not appropriate for emergency situations. It takes many days to process the donated blood. RISKS AND COMPLICATIONS Although transfusion therapy is very safe and saves many lives, the main dangers of transfusion include:   Getting an infectious disease.  Developing a transfusion reaction. This is an allergic reaction to something in the blood you were given. Every precaution is taken to prevent this. The decision to have a blood transfusion has been considered carefully by your caregiver  before blood is given. Blood is not given unless the benefits outweigh the risks. AFTER THE TRANSFUSION  Right after receiving a blood transfusion, you will usually feel much better and more energetic. This is especially true if your red blood cells have gotten low (anemic). The transfusion raises the level of the red blood cells which carry oxygen, and this usually causes an energy increase.  The nurse administering the transfusion will monitor you carefully for complications. HOME CARE INSTRUCTIONS  No special instructions are needed after a transfusion. You may find your energy is better. Speak with your caregiver about any limitations on activity for underlying diseases you may have. SEEK MEDICAL CARE IF:   Your condition is not improving after your transfusion.  You develop redness or irritation at the intravenous (IV) site. SEEK IMMEDIATE MEDICAL CARE IF:  Any of the following symptoms occur over the next 12 hours:  Shaking chills.  You have a temperature by mouth above 102 F (38.9 C), not controlled by medicine.  Chest, back, or muscle pain.  People around you feel you are not acting correctly or are confused.  Shortness of breath or difficulty breathing.  Dizziness and fainting.  You get a rash or develop hives.  You have a decrease in urine output.  Your urine turns a dark color or changes to pink, red, or brown. Any of the following symptoms occur over the next 10 days:  You have a temperature by mouth above 102 F (38.9 C), not controlled by medicine.  Shortness of breath.  Weakness after normal activity.  The white part of the eye turns yellow (jaundice).  You have a decrease in the amount of urine or are urinating less often.  Your urine turns a dark color or changes to pink, red, or brown. Document Released: 12/26/1999 Document Revised: 03/22/2011 Document Reviewed: 08/14/2007 [facility] Patient Information 2014 [place],  [place].  _______________________________________________________________________

## 2013-08-23 ENCOUNTER — Encounter (HOSPITAL_COMMUNITY): Payer: Self-pay

## 2013-08-23 ENCOUNTER — Encounter (HOSPITAL_COMMUNITY)
Admission: RE | Admit: 2013-08-23 | Discharge: 2013-08-23 | Disposition: A | Discharge status: Home / Self Care | Payer: BC Managed Care – PPO | Source: Ambulatory Visit | Attending: Orthopedic Surgery | Admitting: Orthopedic Surgery

## 2013-08-23 DIAGNOSIS — Z01812 Encounter for preprocedural laboratory examination: Secondary | ICD-10-CM | POA: Diagnosis present

## 2013-08-23 DIAGNOSIS — Z01818 Encounter for other preprocedural examination: Secondary | ICD-10-CM | POA: Insufficient documentation

## 2013-08-23 HISTORY — DX: Dermatitis, unspecified: L30.9

## 2013-08-23 HISTORY — DX: Nausea with vomiting, unspecified: R11.2

## 2013-08-23 HISTORY — DX: Unspecified rotator cuff tear or rupture of unspecified shoulder, not specified as traumatic: M75.100

## 2013-08-23 HISTORY — DX: Other specified postprocedural states: Z98.890

## 2013-08-23 HISTORY — DX: Personal history of other diseases of the musculoskeletal system and connective tissue: Z87.39

## 2013-08-23 LAB — URINALYSIS, ROUTINE W REFLEX MICROSCOPIC
Bilirubin Urine: NEGATIVE
GLUCOSE, UA: NEGATIVE mg/dL
Hgb urine dipstick: NEGATIVE
Ketones, ur: NEGATIVE mg/dL
Nitrite: POSITIVE — AB
PROTEIN: NEGATIVE mg/dL
Specific Gravity, Urine: 1.02 (ref 1.005–1.030)
Urobilinogen, UA: 0.2 mg/dL (ref 0.0–1.0)
pH: 5.5 (ref 5.0–8.0)

## 2013-08-23 LAB — PROTIME-INR
INR: 1.04 (ref 0.00–1.49)
PROTHROMBIN TIME: 13.6 s (ref 11.6–15.2)

## 2013-08-23 LAB — CBC
HCT: 35 % — ABNORMAL LOW (ref 39.0–52.0)
Hemoglobin: 11.6 g/dL — ABNORMAL LOW (ref 13.0–17.0)
MCH: 31.4 pg (ref 26.0–34.0)
MCHC: 33.1 g/dL (ref 30.0–36.0)
MCV: 94.6 fL (ref 78.0–100.0)
PLATELETS: 265 10*3/uL (ref 150–400)
RBC: 3.7 MIL/uL — ABNORMAL LOW (ref 4.22–5.81)
RDW: 13.1 % (ref 11.5–15.5)
WBC: 7.4 10*3/uL (ref 4.0–10.5)

## 2013-08-23 LAB — BASIC METABOLIC PANEL
Anion gap: 12 (ref 5–15)
BUN: 13 mg/dL (ref 6–23)
CALCIUM: 9.3 mg/dL (ref 8.4–10.5)
CO2: 26 mEq/L (ref 19–32)
Chloride: 100 mEq/L (ref 96–112)
Creatinine, Ser: 1 mg/dL (ref 0.50–1.35)
GFR, EST NON AFRICAN AMERICAN: 79 mL/min — AB (ref >90)
Glucose, Bld: 92 mg/dL (ref 70–99)
POTASSIUM: 3.4 meq/L — AB (ref 3.7–5.3)
SODIUM: 138 meq/L (ref 137–147)

## 2013-08-23 LAB — APTT: aPTT: 39 seconds — ABNORMAL HIGH (ref 24–37)

## 2013-08-23 LAB — URINE MICROSCOPIC-ADD ON

## 2013-08-23 LAB — SURGICAL PCR SCREEN
MRSA, PCR: NEGATIVE
Staphylococcus aureus: NEGATIVE

## 2013-08-23 NOTE — Progress Notes (Signed)
08/23/13 0849  OBSTRUCTIVE SLEEP APNEA  Have you ever been diagnosed with sleep apnea through a sleep study? No  Do you snore loudly (loud enough to be heard through closed doors)?  0  Do you often feel tired, fatigued, or sleepy during the daytime? 0  Has anyone observed you stop breathing during your sleep? 0  Do you have, or are you being treated for high blood pressure? 1  BMI more than 35 kg/m2? 0  Age over 61 years old? 1  Neck circumference greater than 40 cm/16 inches? 1  Gender: 1  Obstructive Sleep Apnea Score 4  Score 4 or greater  Results sent to PCP

## 2013-08-23 NOTE — Progress Notes (Signed)
Abnormal UA faxed to Dr.Olin 

## 2013-09-02 NOTE — H&P (Signed)
TOTAL KNEE ADMISSION H&P  Patient is being admitted for right total knee arthroplasty.  Subjective:  Chief Complaint:     Right knee OA / pain.  HPI: JEB SCHLOEMER, 63 y.o. male, has a history of pain and functional disability in the right knee due to arthritis and has failed non-surgical conservative treatments for greater than 12 weeks to includeNSAID's and/or analgesics, corticosteriod injections, viscosupplementation injections and activity modification.  Onset of symptoms was gradual, starting  years ago with gradually worsening course since that time. The patient noted prior procedures on the knee to include  arthroplasty on the left knee per Dr. Alvan Dame in 12/2009.  Patient currently rates pain in the right knee(s) at 6 out of 10 with activity. Patient has worsening of pain with activity and weight bearing, pain that interferes with activities of daily living, pain with passive range of motion, crepitus and joint swelling.  Patient has evidence of periarticular osteophytes and joint space narrowing by imaging studies.  There is no active infection.  Risks, benefits and expectations were discussed with the patient.  Risks including but not limited to the risk of anesthesia, blood clots, nerve damage, blood vessel damage, failure of the prosthesis, infection and up to and including death.  Patient understand the risks, benefits and expectations and wishes to proceed with surgery.   PCP: Gerrit Heck, MD  D/C Plans:      Home with HHPT  Post-op Meds:       No Rx given  Tranexamic Acid:      To be given - IV    Decadron:      Is to be given  FYI:     ASA post-op  Norco post-op   Nurse order - provide bedside commode   PREVIOUS COMPONENTS (Left TKA): Depuy RP posterior stabilized knee  Femur:  size 4  Tibia: size 4  Insert:  15 mm  Patella:   41   Past Medical History  Diagnosis Date  . Crohn's colitis     06-28-13 under treatment for recent flare up-  . Frequent  bowel movements     06-28-13  extreme urgency with bowel movements "3-5" per day or more  . Hypertension   . GERD (gastroesophageal reflux disease)   . Arthritis     Rheumatoid , osteoarthritis  . Complication of anesthesia   . PONV (postoperative nausea and vomiting)     "with ether"  . Rotator cuff tear     left - limited motion of arm  . Eczema   . History of gout     Past Surgical History  Procedure Laterality Date  . Cystoscopy      "clean out of uric acid deposits"  . Joint replacement      LTKA '11  . 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 Smoking status: Never Smoker   . Smokeless tobacco: Not on file  . Alcohol Use: Yes     Comment: rare -social      Review of Systems  Constitutional: Negative.   HENT: Negative.   Eyes: Negative.   Respiratory: Negative.   Cardiovascular: Negative.   Gastrointestinal: Positive for heartburn, diarrhea and constipation.  Genitourinary: Negative.   Musculoskeletal: Positive for joint pain.  Skin: Positive for rash.  Neurological: Negative.   Endo/Heme/Allergies:  Negative.   Psychiatric/Behavioral: Negative.     Objective:  Physical Exam  Constitutional: He is oriented to person, place, and time. He appears well-developed and well-nourished.  HENT:  Head: Normocephalic and atraumatic.  Mouth/Throat: Oropharynx is clear and moist.  Eyes: Pupils are equal, round, and reactive to light.  Neck: Neck supple. No JVD present. No tracheal deviation present. No thyromegaly present.  Cardiovascular: Normal rate, regular rhythm, normal heart sounds and intact distal pulses.   Respiratory: Effort normal and breath sounds normal. No stridor. No respiratory distress. He has no wheezes.  GI: Soft. There is no tenderness. There is no guarding.  Musculoskeletal:       Right knee: He exhibits  decreased range of motion, swelling and bony tenderness. He exhibits no ecchymosis, no deformity, no laceration and no erythema. Tenderness found.  Lymphadenopathy:    He has no cervical adenopathy.  Neurological: He is alert and oriented to person, place, and time.  Skin: Skin is warm and dry.  Psychiatric: He has a normal mood and affect.    Labs:  Estimated body mass index is 24.58 kg/(m^2) as calculated from the following:   Height as of 06/28/13: 5\' 11"  (1.803 m).   Weight as of 06/28/13: 79.89 kg (176 lb 2 oz).   Imaging Review Plain radiographs demonstrate severe degenerative joint disease of the right knee(s). The overall alignment isneutral. The bone quality appears to be good for age and reported activity level.  Assessment/Plan:  End stage arthritis, right knee   The patient history, physical examination, clinical judgment of the provider and imaging studies are consistent with end stage degenerative joint disease of the right knee(s) and total knee arthroplasty is deemed medically necessary. The treatment options including medical management, injection therapy arthroscopy and arthroplasty were discussed at length. The risks and benefits of total knee arthroplasty were presented and reviewed. The risks due to aseptic loosening, infection, stiffness, patella tracking problems, thromboembolic complications and other imponderables were discussed. The patient acknowledged the explanation, agreed to proceed with the plan and consent was signed. Patient is being admitted for inpatient treatment for surgery, pain control, PT, OT, prophylactic antibiotics, VTE prophylaxis, progressive ambulation and ADL's and discharge planning. The patient is planning to be discharged home with home health services.     [place] Pugh Emerson Barretto   PA-C  09/02/2013, 5:29 PM

## 2013-09-04 ENCOUNTER — Encounter (HOSPITAL_COMMUNITY): Payer: BC Managed Care – PPO | Admitting: Registered Nurse

## 2013-09-04 ENCOUNTER — Encounter (HOSPITAL_COMMUNITY): Payer: Self-pay | Admitting: *Deleted

## 2013-09-04 ENCOUNTER — Inpatient Hospital Stay (HOSPITAL_COMMUNITY): Payer: BC Managed Care – PPO | Admitting: Registered Nurse

## 2013-09-04 ENCOUNTER — Encounter (HOSPITAL_COMMUNITY)
Admission: RE | Disposition: A | Discharge status: Home Health | Payer: Self-pay | Source: Ambulatory Visit | Attending: Orthopedic Surgery

## 2013-09-04 ENCOUNTER — Inpatient Hospital Stay (HOSPITAL_COMMUNITY)
Admission: RE | Admit: 2013-09-04 | Discharge: 2013-09-05 | DRG: 470 | Disposition: A | Discharge status: Home Health | Payer: BC Managed Care – PPO | Source: Ambulatory Visit | Attending: Orthopedic Surgery | Admitting: Orthopedic Surgery

## 2013-09-04 DIAGNOSIS — D62 Acute posthemorrhagic anemia: Secondary | ICD-10-CM | POA: Diagnosis not present

## 2013-09-04 DIAGNOSIS — M6580 Other synovitis and tenosynovitis, unspecified site: Secondary | ICD-10-CM | POA: Diagnosis present

## 2013-09-04 DIAGNOSIS — M25569 Pain in unspecified knee: Secondary | ICD-10-CM | POA: Diagnosis present

## 2013-09-04 DIAGNOSIS — M25469 Effusion, unspecified knee: Secondary | ICD-10-CM | POA: Diagnosis present

## 2013-09-04 DIAGNOSIS — M1710 Unilateral primary osteoarthritis, unspecified knee: Principal | ICD-10-CM | POA: Diagnosis present

## 2013-09-04 DIAGNOSIS — K219 Gastro-esophageal reflux disease without esophagitis: Secondary | ICD-10-CM | POA: Diagnosis present

## 2013-09-04 DIAGNOSIS — I10 Essential (primary) hypertension: Secondary | ICD-10-CM | POA: Diagnosis present

## 2013-09-04 DIAGNOSIS — K5010 Crohn's disease of large intestine without complications: Secondary | ICD-10-CM | POA: Diagnosis present

## 2013-09-04 DIAGNOSIS — M898X9 Other specified disorders of bone, unspecified site: Secondary | ICD-10-CM | POA: Diagnosis present

## 2013-09-04 DIAGNOSIS — Z96651 Presence of right artificial knee joint: Secondary | ICD-10-CM

## 2013-09-04 DIAGNOSIS — M109 Gout, unspecified: Secondary | ICD-10-CM | POA: Diagnosis present

## 2013-09-04 DIAGNOSIS — Z01812 Encounter for preprocedural laboratory examination: Secondary | ICD-10-CM

## 2013-09-04 DIAGNOSIS — Z96659 Presence of unspecified artificial knee joint: Secondary | ICD-10-CM

## 2013-09-04 HISTORY — PX: TOTAL KNEE ARTHROPLASTY: SHX125

## 2013-09-04 LAB — TYPE AND SCREEN
ABO/RH(D): B NEG
ANTIBODY SCREEN: NEGATIVE

## 2013-09-04 SURGERY — ARTHROPLASTY, KNEE, TOTAL
Anesthesia: Spinal | Site: Knee | Laterality: Right

## 2013-09-04 MED ORDER — BISACODYL 10 MG RE SUPP: 10.0000 mg | Freq: Every day | RECTAL | Status: DC | PRN

## 2013-09-04 MED ORDER — PANTOPRAZOLE SODIUM 40 MG PO TBEC
80.0000 mg | DELAYED_RELEASE_TABLET | Freq: Every day | ORAL | Status: DC
  Administered 2013-09-05: 80 mg via ORAL
  Filled 2013-09-04: qty 2

## 2013-09-04 MED ORDER — FENTANYL CITRATE 0.05 MG/ML IJ SOLN
INTRAMUSCULAR | Status: AC
  Filled 2013-09-04: qty 2

## 2013-09-04 MED ORDER — SODIUM CHLORIDE 0.9 % IR SOLN
Status: DC | PRN
  Administered 2013-09-04: 1000 mL

## 2013-09-04 MED ORDER — LIDOCAINE HCL (CARDIAC) 20 MG/ML IV SOLN
INTRAVENOUS | Status: AC
  Filled 2013-09-04: qty 5

## 2013-09-04 MED ORDER — CHLORHEXIDINE GLUCONATE 4 % EX LIQD: 60.0000 mL | Freq: Once | CUTANEOUS | Status: DC

## 2013-09-04 MED ORDER — PROPOFOL 10 MG/ML IV BOLUS
INTRAVENOUS | Status: AC
  Filled 2013-09-04: qty 20

## 2013-09-04 MED ORDER — HYDROMORPHONE HCL PF 1 MG/ML IJ SOLN: 0.5000 mg | INTRAMUSCULAR | Status: DC | PRN

## 2013-09-04 MED ORDER — HYDROMORPHONE HCL PF 1 MG/ML IJ SOLN: 0.2500 mg | INTRAMUSCULAR | Status: DC | PRN

## 2013-09-04 MED ORDER — VANCOMYCIN 50 MG/ML ORAL SOLUTION
500.0000 mg | Freq: Every day | ORAL | Status: DC
  Administered 2013-09-05: 500 mg via ORAL
  Filled 2013-09-04: qty 10

## 2013-09-04 MED ORDER — MESALAMINE 1.2 G PO TBEC
4.8000 g | DELAYED_RELEASE_TABLET | Freq: Every day | ORAL | Status: DC
  Administered 2013-09-05: 4.8 g via ORAL
  Filled 2013-09-04 (×2): qty 4

## 2013-09-04 MED ORDER — ZOLPIDEM TARTRATE 10 MG PO TABS: 10.0000 mg | ORAL_TABLET | Freq: Every evening | ORAL | Status: DC | PRN

## 2013-09-04 MED ORDER — LACTATED RINGERS IV SOLN
INTRAVENOUS | Status: DC
  Administered 2013-09-04 (×2): via INTRAVENOUS
  Administered 2013-09-04: 1000 mL via INTRAVENOUS

## 2013-09-04 MED ORDER — MIDAZOLAM HCL 2 MG/2ML IJ SOLN
INTRAMUSCULAR | Status: AC
  Filled 2013-09-04: qty 2

## 2013-09-04 MED ORDER — PREDNISONE 5 MG PO TABS: 5.0000 mg | ORAL_TABLET | ORAL | Status: DC

## 2013-09-04 MED ORDER — TRANEXAMIC ACID 100 MG/ML IV SOLN
1000.0000 mg | Freq: Once | INTRAVENOUS | Status: AC
  Administered 2013-09-04: 1000 mg via INTRAVENOUS
  Filled 2013-09-04: qty 10

## 2013-09-04 MED ORDER — METOCLOPRAMIDE HCL 10 MG PO TABS
5.0000 mg | ORAL_TABLET | Freq: Three times a day (TID) | ORAL | Status: DC | PRN
Start: 2013-09-04 — End: 2013-09-05

## 2013-09-04 MED ORDER — KETOROLAC TROMETHAMINE 30 MG/ML IJ SOLN
INTRAMUSCULAR | Status: AC
  Filled 2013-09-04: qty 1

## 2013-09-04 MED ORDER — MIDAZOLAM HCL 5 MG/5ML IJ SOLN
INTRAMUSCULAR | Status: DC | PRN
Start: 2013-09-04 — End: 2013-09-04
  Administered 2013-09-04: 2 mg via INTRAVENOUS

## 2013-09-04 MED ORDER — CEFAZOLIN SODIUM-DEXTROSE 2-3 GM-% IV SOLR
2.0000 g | Freq: Four times a day (QID) | INTRAVENOUS | Status: AC
  Administered 2013-09-04 – 2013-09-05 (×2): 2 g via INTRAVENOUS
  Filled 2013-09-04 (×2): qty 50

## 2013-09-04 MED ORDER — DEXAMETHASONE SODIUM PHOSPHATE 10 MG/ML IJ SOLN: 10.0000 mg | Freq: Once | INTRAMUSCULAR | Status: DC

## 2013-09-04 MED ORDER — PHENYLEPHRINE HCL 10 MG/ML IJ SOLN
INTRAMUSCULAR | Status: DC | PRN
  Administered 2013-09-04 (×2): 80 ug via INTRAVENOUS

## 2013-09-04 MED ORDER — BUPIVACAINE-EPINEPHRINE (PF) 0.25% -1:200000 IJ SOLN
INTRAMUSCULAR | Status: AC
  Filled 2013-09-04: qty 30

## 2013-09-04 MED ORDER — MEPERIDINE HCL 50 MG/ML IJ SOLN: 6.2500 mg | INTRAMUSCULAR | Status: DC | PRN

## 2013-09-04 MED ORDER — POLYETHYLENE GLYCOL 3350 17 G PO PACK
17.0000 g | PACK | Freq: Two times a day (BID) | ORAL | Status: DC
Start: 2013-09-04 — End: 2013-09-05

## 2013-09-04 MED ORDER — DIPHENHYDRAMINE HCL 25 MG PO CAPS: 25.0000 mg | ORAL_CAPSULE | Freq: Four times a day (QID) | ORAL | Status: DC | PRN

## 2013-09-04 MED ORDER — ASPIRIN EC 325 MG PO TBEC
325.0000 mg | DELAYED_RELEASE_TABLET | Freq: Two times a day (BID) | ORAL | Status: DC
  Administered 2013-09-05: 325 mg via ORAL
  Filled 2013-09-04 (×3): qty 1

## 2013-09-04 MED ORDER — PHENOL 1.4 % MT LIQD
1.0000 | OROMUCOSAL | Status: DC | PRN
  Filled 2013-09-04: qty 177

## 2013-09-04 MED ORDER — METOCLOPRAMIDE HCL 5 MG/ML IJ SOLN: 5.0000 mg | Freq: Three times a day (TID) | INTRAMUSCULAR | Status: DC | PRN

## 2013-09-04 MED ORDER — CEFAZOLIN SODIUM-DEXTROSE 2-3 GM-% IV SOLR
2.0000 g | INTRAVENOUS | Status: AC
  Administered 2013-09-04: 2 g via INTRAVENOUS

## 2013-09-04 MED ORDER — DEXAMETHASONE SODIUM PHOSPHATE 10 MG/ML IJ SOLN
10.0000 mg | Freq: Once | INTRAMUSCULAR | Status: DC
  Filled 2013-09-04: qty 1

## 2013-09-04 MED ORDER — PHENYLEPHRINE HCL 10 MG/ML IJ SOLN
INTRAMUSCULAR | Status: AC
  Filled 2013-09-04: qty 1

## 2013-09-04 MED ORDER — FENTANYL CITRATE 0.05 MG/ML IJ SOLN
INTRAMUSCULAR | Status: DC | PRN
  Administered 2013-09-04: 100 ug via INTRAVENOUS

## 2013-09-04 MED ORDER — ALUM & MAG HYDROXIDE-SIMETH 200-200-20 MG/5ML PO SUSP: 30.0000 mL | ORAL | Status: DC | PRN

## 2013-09-04 MED ORDER — METHOCARBAMOL 1000 MG/10ML IJ SOLN
500.0000 mg | Freq: Four times a day (QID) | INTRAVENOUS | Status: DC | PRN
  Filled 2013-09-04: qty 5

## 2013-09-04 MED ORDER — KETOROLAC TROMETHAMINE 30 MG/ML IJ SOLN
INTRAMUSCULAR | Status: DC | PRN
  Administered 2013-09-04: 30 mg

## 2013-09-04 MED ORDER — 0.9 % SODIUM CHLORIDE (POUR BTL) OPTIME
TOPICAL | Status: DC | PRN
  Administered 2013-09-04: 1000 mL

## 2013-09-04 MED ORDER — LACTATED RINGERS IV SOLN
INTRAVENOUS | Status: DC
  Administered 2013-09-04: 20:00:00 via INTRAVENOUS

## 2013-09-04 MED ORDER — CELECOXIB 200 MG PO CAPS
200.0000 mg | ORAL_CAPSULE | Freq: Two times a day (BID) | ORAL | Status: DC
  Administered 2013-09-05: 200 mg via ORAL
  Filled 2013-09-04 (×3): qty 1

## 2013-09-04 MED ORDER — VANCOMYCIN HCL 125 MG PO CAPS: 500.0000 mg | ORAL_CAPSULE | Freq: Every day | ORAL | Status: DC

## 2013-09-04 MED ORDER — DEXAMETHASONE SODIUM PHOSPHATE 10 MG/ML IJ SOLN
10.0000 mg | Freq: Once | INTRAMUSCULAR | Status: AC
  Administered 2013-09-04: 10 mg via INTRAVENOUS

## 2013-09-04 MED ORDER — HYDROCODONE-ACETAMINOPHEN 7.5-325 MG PO TABS
1.0000 | ORAL_TABLET | ORAL | Status: DC
  Administered 2013-09-05 (×2): 2 via ORAL
  Administered 2013-09-05: 1 via ORAL
  Administered 2013-09-05: 2 via ORAL
  Filled 2013-09-04: qty 2
  Filled 2013-09-04: qty 1
  Filled 2013-09-04 (×2): qty 2

## 2013-09-04 MED ORDER — ALLOPURINOL 100 MG PO TABS
200.0000 mg | ORAL_TABLET | Freq: Every day | ORAL | Status: DC
  Administered 2013-09-05: 200 mg via ORAL
  Filled 2013-09-04: qty 2

## 2013-09-04 MED ORDER — DEXAMETHASONE SODIUM PHOSPHATE 10 MG/ML IJ SOLN
INTRAMUSCULAR | Status: AC
  Filled 2013-09-04: qty 1

## 2013-09-04 MED ORDER — FERROUS FUMARATE 325 (106 FE) MG PO TABS
1.0000 | ORAL_TABLET | Freq: Two times a day (BID) | ORAL | Status: DC
  Administered 2013-09-05: 106 mg via ORAL
  Filled 2013-09-04 (×2): qty 1

## 2013-09-04 MED ORDER — BUPIVACAINE HCL (PF) 0.75 % IJ SOLN
INTRAMUSCULAR | Status: DC | PRN
  Administered 2013-09-04: 15 mg

## 2013-09-04 MED ORDER — BUPIVACAINE LIPOSOME 1.3 % IJ SUSP
20.0000 mL | Freq: Once | INTRAMUSCULAR | Status: AC
  Administered 2013-09-04: 20 mL
  Filled 2013-09-04: qty 20

## 2013-09-04 MED ORDER — ONDANSETRON HCL 4 MG/2ML IJ SOLN
INTRAMUSCULAR | Status: DC | PRN
  Administered 2013-09-04: 4 mg via INTRAVENOUS

## 2013-09-04 MED ORDER — PROMETHAZINE HCL 25 MG/ML IJ SOLN: 6.2500 mg | INTRAMUSCULAR | Status: DC | PRN

## 2013-09-04 MED ORDER — DOCUSATE SODIUM 100 MG PO CAPS: 100.0000 mg | ORAL_CAPSULE | Freq: Two times a day (BID) | ORAL | Status: DC

## 2013-09-04 MED ORDER — ONDANSETRON HCL 4 MG/2ML IJ SOLN: 4.0000 mg | Freq: Four times a day (QID) | INTRAMUSCULAR | Status: DC | PRN

## 2013-09-04 MED ORDER — SODIUM CHLORIDE 0.9 % IJ SOLN
INTRAMUSCULAR | Status: DC | PRN
  Administered 2013-09-04: 9 mL

## 2013-09-04 MED ORDER — SACCHAROMYCES BOULARDII 250 MG PO CAPS
250.0000 mg | ORAL_CAPSULE | Freq: Two times a day (BID) | ORAL | Status: DC
  Administered 2013-09-04 – 2013-09-05 (×2): 250 mg via ORAL
  Filled 2013-09-04 (×3): qty 1

## 2013-09-04 MED ORDER — SODIUM CHLORIDE 0.9 % IV SOLN
10.0000 mg | INTRAVENOUS | Status: DC | PRN
  Administered 2013-09-04: 15 ug/min via INTRAVENOUS

## 2013-09-04 MED ORDER — BUPIVACAINE-EPINEPHRINE (PF) 0.25% -1:200000 IJ SOLN
INTRAMUSCULAR | Status: DC | PRN
  Administered 2013-09-04: 30 mL

## 2013-09-04 MED ORDER — SODIUM CHLORIDE 0.9 % IJ SOLN
INTRAMUSCULAR | Status: AC
  Filled 2013-09-04: qty 10

## 2013-09-04 MED ORDER — PHENYLEPHRINE 40 MCG/ML (10ML) SYRINGE FOR IV PUSH (FOR BLOOD PRESSURE SUPPORT)
PREFILLED_SYRINGE | INTRAVENOUS | Status: AC
  Filled 2013-09-04: qty 10

## 2013-09-04 MED ORDER — ONDANSETRON HCL 4 MG PO TABS: 4.0000 mg | ORAL_TABLET | Freq: Four times a day (QID) | ORAL | Status: DC | PRN

## 2013-09-04 MED ORDER — MAGNESIUM CITRATE PO SOLN: 1.0000 | Freq: Once | ORAL | Status: AC | PRN

## 2013-09-04 MED ORDER — POTASSIUM CHLORIDE 2 MEQ/ML IV SOLN
INTRAVENOUS | Status: DC
  Administered 2013-09-04: 22:00:00 via INTRAVENOUS
  Filled 2013-09-04 (×3): qty 1000

## 2013-09-04 MED ORDER — CEFAZOLIN SODIUM-DEXTROSE 2-3 GM-% IV SOLR
INTRAVENOUS | Status: AC
  Filled 2013-09-04: qty 50

## 2013-09-04 MED ORDER — STERILE WATER FOR IRRIGATION IR SOLN
Status: DC | PRN
  Administered 2013-09-04: 1500 mL

## 2013-09-04 MED ORDER — DOXAZOSIN MESYLATE 4 MG PO TABS
4.0000 mg | ORAL_TABLET | Freq: Every day | ORAL | Status: DC
  Administered 2013-09-04: 4 mg via ORAL
  Filled 2013-09-04 (×2): qty 1

## 2013-09-04 MED ORDER — METHOCARBAMOL 500 MG PO TABS
500.0000 mg | ORAL_TABLET | Freq: Four times a day (QID) | ORAL | Status: DC | PRN
  Administered 2013-09-05: 500 mg via ORAL
  Filled 2013-09-04: qty 1

## 2013-09-04 MED ORDER — COLCHICINE 0.6 MG PO TABS: 0.6000 mg | ORAL_TABLET | ORAL | Status: DC

## 2013-09-04 MED ORDER — TRANEXAMIC ACID 100 MG/ML IV SOLN: 1000.0000 mg | Freq: Once | INTRAVENOUS | Status: DC

## 2013-09-04 MED ORDER — PROPOFOL INFUSION 10 MG/ML OPTIME
INTRAVENOUS | Status: DC | PRN
  Administered 2013-09-04: 60 ug/kg/min via INTRAVENOUS

## 2013-09-04 MED ORDER — MENTHOL 3 MG MT LOZG
1.0000 | LOZENGE | OROMUCOSAL | Status: DC | PRN
  Filled 2013-09-04: qty 9

## 2013-09-04 SURGICAL SUPPLY — 53 items
ADH SKN CLS APL DERMABOND .7 (GAUZE/BANDAGES/DRESSINGS) ×1
BAG SPEC THK2 15X12 ZIP CLS (MISCELLANEOUS)
BAG ZIPLOCK 12X15 (MISCELLANEOUS) IMPLANT
BANDAGE ELASTIC 6 VELCRO ST LF (GAUZE/BANDAGES/DRESSINGS) ×3 IMPLANT
BANDAGE ESMARK 6X9 LF (GAUZE/BANDAGES/DRESSINGS) ×1 IMPLANT
BLADE SAW SGTL 13.0X1.19X90.0M (BLADE) ×3 IMPLANT
BNDG CMPR 9X6 STRL LF SNTH (GAUZE/BANDAGES/DRESSINGS) ×1
BNDG ESMARK 6X9 LF (GAUZE/BANDAGES/DRESSINGS) ×3
BOWL SMART MIX CTS (DISPOSABLE) ×3 IMPLANT
CAP KNEE ATTUNE RP ×2 IMPLANT
CEMENT HV SMART SET (Cement) ×4 IMPLANT
CUFF TOURN SGL QUICK 34 (TOURNIQUET CUFF) ×3
CUFF TRNQT CYL 34X4X40X1 (TOURNIQUET CUFF) ×1 IMPLANT
DERMABOND ADVANCED (GAUZE/BANDAGES/DRESSINGS) ×2
DERMABOND ADVANCED .7 DNX12 (GAUZE/BANDAGES/DRESSINGS) ×1 IMPLANT
DRAPE EXTREMITY T 121X128X90 (DRAPE) ×3 IMPLANT
DRAPE POUCH INSTRU U-SHP 10X18 (DRAPES) ×3 IMPLANT
DRAPE U-SHAPE 47X51 STRL (DRAPES) ×3 IMPLANT
DRSG AQUACEL AG ADV 3.5X10 (GAUZE/BANDAGES/DRESSINGS) ×3 IMPLANT
DURAPREP 26ML APPLICATOR (WOUND CARE) ×6 IMPLANT
ELECT REM PT RETURN 9FT ADLT (ELECTROSURGICAL) ×3
ELECTRODE REM PT RTRN 9FT ADLT (ELECTROSURGICAL) ×1 IMPLANT
FACESHIELD WRAPAROUND (MASK) ×15 IMPLANT
FACESHIELD WRAPAROUND OR TEAM (MASK) ×5 IMPLANT
GLOVE BIOGEL PI IND STRL 7.5 (GLOVE) ×1 IMPLANT
GLOVE BIOGEL PI IND STRL 8 (GLOVE) ×1 IMPLANT
GLOVE BIOGEL PI INDICATOR 7.5 (GLOVE) ×2
GLOVE BIOGEL PI INDICATOR 8 (GLOVE) ×2
GLOVE ECLIPSE 8.0 STRL XLNG CF (GLOVE) ×3 IMPLANT
GLOVE ORTHO TXT STRL SZ7.5 (GLOVE) ×6 IMPLANT
GOWN SPEC L3 XXLG W/TWL (GOWN DISPOSABLE) ×3 IMPLANT
GOWN STRL REUS W/TWL LRG LVL3 (GOWN DISPOSABLE) ×3 IMPLANT
HANDPIECE INTERPULSE COAX TIP (DISPOSABLE) ×3
KIT BASIN OR (CUSTOM PROCEDURE TRAY) ×3 IMPLANT
MANIFOLD NEPTUNE II (INSTRUMENTS) ×3 IMPLANT
NDL SAFETY ECLIPSE 18X1.5 (NEEDLE) ×1 IMPLANT
NEEDLE HYPO 18GX1.5 SHARP (NEEDLE) ×3
PACK TOTAL JOINT (CUSTOM PROCEDURE TRAY) ×3 IMPLANT
POSITIONER SURGICAL ARM (MISCELLANEOUS) ×3 IMPLANT
SET HNDPC FAN SPRY TIP SCT (DISPOSABLE) ×1 IMPLANT
SET PAD KNEE POSITIONER (MISCELLANEOUS) ×3 IMPLANT
SUCTION FRAZIER 12FR DISP (SUCTIONS) ×3 IMPLANT
SUT MNCRL AB 4-0 PS2 18 (SUTURE) ×3 IMPLANT
SUT VIC AB 1 CT1 36 (SUTURE) ×3 IMPLANT
SUT VIC AB 2-0 CT1 27 (SUTURE) ×9
SUT VIC AB 2-0 CT1 TAPERPNT 27 (SUTURE) ×3 IMPLANT
SUT VLOC 180 0 24IN GS25 (SUTURE) ×3 IMPLANT
SYRINGE 60CC LL (MISCELLANEOUS) ×3 IMPLANT
TOWEL OR 17X26 10 PK STRL BLUE (TOWEL DISPOSABLE) ×3 IMPLANT
TOWEL OR NON WOVEN STRL DISP B (DISPOSABLE) IMPLANT
TRAY FOLEY CATH 14FRSI W/METER (CATHETERS) ×3 IMPLANT
WATER STERILE IRR 1500ML POUR (IV SOLUTION) ×3 IMPLANT
WRAP KNEE MAXI GEL POST OP (GAUZE/BANDAGES/DRESSINGS) ×3 IMPLANT

## 2013-09-04 NOTE — Transfer of Care (Signed)
Immediate Anesthesia Transfer of Care Note  Patient: Frank Dodson  Procedure(s) Performed: Procedure(s): RIGHT TOTAL KNEE ARTHROPLASTY (Right)  Patient Location: PACU  Anesthesia Type:Spinal  Level of Consciousness: awake and patient cooperative  Airway & Oxygen Therapy: Patient Spontanous Breathing and Patient connected to face mask oxygen  Post-op Assessment: Report given to PACU RN and Post -op Vital signs reviewed and stable  Post vital signs: Reviewed and stable  Complications: No apparent anesthesia complications

## 2013-09-04 NOTE — Op Note (Signed)
NAME:  Frank Dodson                      MEDICAL RECORD NO.:  [id number]                             FACILITY:  [facility]      PHYSICIAN:  Pietro Cassis. Alvan Dame, M.D.  DATE OF BIRTH:  09-Jan-1951      DATE OF PROCEDURE:  09/04/2013                                     OPERATIVE REPORT         PREOPERATIVE DIAGNOSIS:  Right knee osteoarthritis.      POSTOPERATIVE DIAGNOSIS:  Right knee osteoarthritis.      FINDINGS:  The patient was noted to have complete loss of cartilage and   bone-on-bone arthritis with associated osteophytes in all three compartments (worse laterally) of   the knee with a significant synovitis and associated effusion.      PROCEDURE:  Right total knee replacement.      COMPONENTS USED:  DePuy rotating platform posterior stabilized knee   system, a size 7 femur, 6 tibia, size 7 mm PS AOX insert, and 41 Anatomic patellar   button.      SURGEON:  Pietro Cassis. Alvan Dame, M.D.      ASSISTANT:  Danae Orleans, PA-C.      ANESTHESIA:  Spinal.      SPECIMENS:  None.      COMPLICATION:  None.      DRAINS:  None.  EBL: <100cc      TOURNIQUET TIME:   Total Tourniquet Time Documented: Thigh (Right) - 41 minutes Total: Thigh (Right) - 41 minutes       The patient was stable to the recovery room.      INDICATION FOR PROCEDURE:  Frank Dodson is a 63 y.o. male patient of   mine.  The patient had been seen, evaluated, and treated conservatively in the   office with medication, activity modification, and injections.  The patient had   radiographic changes of bone-on-bone arthritis with endplate sclerosis and osteophytes noted.      The patient failed conservative measures including medication, injections, and activity modification, and at this point was ready for more definitive measures.   Based on the radiographic changes and failed conservative measures, the patient   decided to proceed with total knee replacement.  Risks of infection,   DVT, component failure, need for revision  surgery, postop course, and   expectations were all   discussed and reviewed.  Consent was obtained for benefit of pain   relief.      PROCEDURE IN DETAIL:  The patient was brought to the operative theater.   Once adequate anesthesia, preoperative antibiotics, 2 gm of Ancef administered, the patient was positioned supine with the right thigh tourniquet placed.  The  right lower extremity was prepped and draped in sterile fashion.  A time-   out was performed identifying the patient, planned procedure, and   extremity.      The right lower extremity was placed in the [facility] leg holder.  The leg was   exsanguinated, tourniquet elevated to 250 mmHg.  A midline incision was   made followed by median parapatellar arthrotomy.  Following initial   exposure, attention  was first directed to the patella.  Precut   measurement was noted to be 25 mm.  I resected down to 14 mm and used a   41 patellar button to restore patellar height as well as cover the cut   surface.      The lug holes were drilled and a metal shim was placed to protect the   patella from retractors and saw blades.      At this point, attention was now directed to the femur.  The femoral   canal was opened with a drill, irrigated to try to prevent fat emboli.  An   intramedullary rod was passed at 5 degrees valgus, 9 mm of bone was   resected off the distal femur.  Following this resection, the tibia was   subluxated anteriorly.  Using the extramedullary guide, 2 mm of bone was resected off   the proximal lateral tibia.  We confirmed the gap would be   stable medially and laterally with a size 6 insert as well as confirmed   the cut was perpendicular in the coronal plane, checking with an alignment rod.      Once this was done, I sized the femur to be a size 7 in the anterior-   posterior dimension, chose a standard component based on medial and   lateral dimension.  The size 7 rotation block was then pinned in   position  anterior referenced using the tensioning device to set rotation and match flexion and extension.  The   anterior, posterior, and  chamfer cuts were made without difficulty nor   notching making certain that I was along the anterior cortex to help   with flexion gap stability.      The final box cut was made off the lateral aspect of distal femur.      At this point, the tibia was sized to be a size 6, the size 6 tray was   then pinned in position through the medial third of the tubercle,   drilled, and keel punched.  Trial reduction was now carried with a 7 femur,  6 tibia, a size 7 mm insert, and the 41 anatomic patella botton.  The knee was brought to   extension, full extension with good flexion stability with the patella   tracking through the trochlea without application of pressure.  Given   all these findings, the trial components removed.  Final components were   opened and cement was mixed.  The knee was irrigated with normal saline   solution and pulse lavage.  The synovial lining was   then injected with 20cc of Exparel, 30 cc of 0.25% Marcaine with epinephrine and 1 cc of Toradol,   total of 61 cc.      The knee was irrigated.  Final implants were then cemented onto clean and   dried cut surfaces of bone with the knee brought to extension with a size 7 mm trial insert.      Once the cement had fully cured, the excess cement was removed   throughout the knee.  I confirmed I was satisfied with the range of   motion and stability, and the final size 7 mm PS insert was chosen.  It was   placed into the knee.      The tourniquet had been let down at 41 minutes.  No significant   hemostasis required.  The   extensor mechanism was then reapproximated using #1 Vicryl and #  0 V-lock sutures with the knee   in flexion.  The   remaining wound was closed with 2-0 Vicryl and running 4-0 Monocryl.   The knee was cleaned, dried, dressed sterilely using Dermabond and   Aquacel dressing.   The patient was then   brought to recovery room in stable condition, tolerating the procedure   well.   Please note that Physician Assistant, Danae Orleans, PA-C, was present for the entirety of the case, and was utilized for pre-operative positioning, peri-operative retractor management, general facilitation of the procedure.  He was also utilized for primary wound closure at the end of the case.              Pietro Cassis Alvan Dame, M.D.    09/04/2013 5:39 PM

## 2013-09-04 NOTE — Interval H&P Note (Signed)
History and Physical Interval Note:  09/04/2013 2:44 PM  Frank Dodson  has presented today for surgery, with the diagnosis of RIGHT KNEE OA   The various methods of treatment have been discussed with the patient and family. After consideration of risks, benefits and other options for treatment, the patient has consented to  Procedure(s): RIGHT TOTAL KNEE ARTHROPLASTY (Right) as a surgical intervention .  The patient's history has been reviewed, patient examined, no change in status, stable for surgery.  I have reviewed the patient's chart and labs.  Questions were answered to the patient's satisfaction.     Mauri Pole

## 2013-09-04 NOTE — Anesthesia Procedure Notes (Signed)
Spinal  Patient location during procedure: OR Start time: 09/04/2013 3:56 PM End time: 09/04/2013 4:00 PM Staffing CRNA/Resident: Harle Stanford R Performed by: resident/CRNA  Preanesthetic Checklist Completed: patient identified, site marked, surgical consent, pre-op evaluation, timeout performed, IV checked, risks and benefits discussed and monitors and equipment checked Spinal Block Patient position: sitting Prep: Betadine Patient monitoring: heart rate, cardiac monitor, continuous pulse ox and blood pressure Approach: midline Location: L3-4 Injection technique: single-shot Needle Needle type: Sprotte  Needle gauge: 24 G Needle length: 9 cm Needle insertion depth: 6 cm Assessment Sensory level: T4

## 2013-09-04 NOTE — Anesthesia Preprocedure Evaluation (Addendum)
Anesthesia Evaluation  Patient identified by MRN, date of birth, ID band Patient awake    Reviewed: Allergy & Precautions, H&P , NPO status , Patient's Chart, lab work & pertinent test results  History of Anesthesia Complications Negative for: history of anesthetic complications  Airway Mallampati: II TM Distance: >3 FB Neck ROM: Full    Dental no notable dental hx.    Pulmonary neg pulmonary ROS,  breath sounds clear to auscultation  Pulmonary exam normal       Cardiovascular hypertension, Pt. on medications Rhythm:Regular Rate:Normal     Neuro/Psych negative neurological ROS  negative psych ROS   GI/Hepatic Neg liver ROS, GERD-  Medicated and Controlled,  Endo/Other  negative endocrine ROS  Renal/GU negative Renal ROS  negative genitourinary   Musculoskeletal  (+) Arthritis -, Rheumatoid disorders,  Chronic steroids   Abdominal   Peds negative pediatric ROS (+)  Hematology negative hematology ROS (+)   Anesthesia Other Findings   Reproductive/Obstetrics negative OB ROS                          Anesthesia Physical Anesthesia Plan  ASA: II  Anesthesia Plan: Spinal   Post-op Pain Management:    Induction:   Airway Management Planned: Simple Face Mask  Additional Equipment:   Intra-op Plan:   Post-operative Plan:   Informed Consent: I have reviewed the patients History and Physical, chart, labs and discussed the procedure including the risks, benefits and alternatives for the proposed anesthesia with the patient or authorized representative who has indicated his/her understanding and acceptance.   Dental advisory given  Plan Discussed with: CRNA  Anesthesia Plan Comments:         Anesthesia Quick Evaluation

## 2013-09-05 ENCOUNTER — Encounter (HOSPITAL_COMMUNITY): Payer: Self-pay | Admitting: Orthopedic Surgery

## 2013-09-05 DIAGNOSIS — D62 Acute posthemorrhagic anemia: Secondary | ICD-10-CM | POA: Diagnosis not present

## 2013-09-05 LAB — BASIC METABOLIC PANEL
ANION GAP: 12 (ref 5–15)
BUN: 17 mg/dL (ref 6–23)
CALCIUM: 8.7 mg/dL (ref 8.4–10.5)
CO2: 23 mEq/L (ref 19–32)
CREATININE: 1.01 mg/dL (ref 0.50–1.35)
Chloride: 103 mEq/L (ref 96–112)
GFR calc non Af Amer: 78 mL/min — ABNORMAL LOW (ref >90)
Glucose, Bld: 165 mg/dL — ABNORMAL HIGH (ref 70–99)
Potassium: 4.4 mEq/L (ref 3.7–5.3)
Sodium: 138 mEq/L (ref 137–147)

## 2013-09-05 LAB — CBC
HCT: 27.9 % — ABNORMAL LOW (ref 39.0–52.0)
Hemoglobin: 9.8 g/dL — ABNORMAL LOW (ref 13.0–17.0)
MCH: 32.6 pg (ref 26.0–34.0)
MCHC: 35.1 g/dL (ref 30.0–36.0)
MCV: 92.7 fL (ref 78.0–100.0)
PLATELETS: 235 10*3/uL (ref 150–400)
RBC: 3.01 MIL/uL — ABNORMAL LOW (ref 4.22–5.81)
RDW: 12.8 % (ref 11.5–15.5)
WBC: 14.1 10*3/uL — ABNORMAL HIGH (ref 4.0–10.5)

## 2013-09-05 MED ORDER — ASPIRIN 325 MG PO TBEC
325.0000 mg | DELAYED_RELEASE_TABLET | Freq: Two times a day (BID) | ORAL | Status: AC
Start: 2013-09-05 — End: 2013-10-03

## 2013-09-05 MED ORDER — HYDROCODONE-ACETAMINOPHEN 7.5-325 MG PO TABS: 1.0000 | ORAL_TABLET | ORAL | Status: DC | PRN

## 2013-09-05 MED ORDER — DSS 100 MG PO CAPS: 100.0000 mg | ORAL_CAPSULE | Freq: Two times a day (BID) | ORAL | Status: DC

## 2013-09-05 MED ORDER — METHOCARBAMOL 500 MG PO TABS: 500.0000 mg | ORAL_TABLET | Freq: Four times a day (QID) | ORAL | Status: DC | PRN

## 2013-09-05 MED ORDER — POLYETHYLENE GLYCOL 3350 17 G PO PACK: 17.0000 g | PACK | Freq: Two times a day (BID) | ORAL | Status: DC

## 2013-09-05 NOTE — Progress Notes (Signed)
Discharge instructions reviewed with patient using teach back method no questions at this time.atient discharged to home

## 2013-09-05 NOTE — Evaluation (Signed)
Physical Therapy Evaluation Patient Details Name: Frank Dodson MRN: [id number] DOB: 02/13/1950 Today's Date: 09/05/2013   History of Present Illness  R TKR; L TKR (11)  Clinical Impression  Pt s/p R TKR presents with decreased R LE strength/ROM and post op pain limiting functional mobility.  Pt should progress well to d/c home with family assist and HHPT follow up.    Follow Up Recommendations Home health PT    Equipment Recommendations  None recommended by PT    Recommendations for Other Services OT consult     Precautions / Restrictions Precautions Precautions: Knee;Fall Restrictions Weight Bearing Restrictions: No Other Position/Activity Restrictions: WBAT      Mobility  Bed Mobility Overal bed mobility: Needs Assistance Bed Mobility: Supine to Sit     Supine to sit: Min assist     General bed mobility comments: cues for sequence and use of L LE to self assist  Transfers Overall transfer level: Needs assistance Equipment used: Rolling walker (2 wheeled) Transfers: Sit to/from Stand Sit to Stand: Min assist         General transfer comment: cues for LE management and use of UEs to self assist  Ambulation/Gait Ambulation/Gait assistance: Min assist;Min guard Ambulation Distance (Feet): 95 Feet Assistive device: Rolling walker (2 wheeled) Gait Pattern/deviations: Step-to pattern;Decreased step length - right;Decreased step length - left;Shuffle;Trunk flexed     General Gait Details: cues for sequence, posture and position from [employer]            Wheelchair Mobility    Modified Rankin (Stroke Patients Only)       Balance                                             Pertinent Vitals/Pain Pain Assessment: 0-10 Pain Score: 5  Pain Location: R knee  Pain Descriptors / Indicators: Sore;Aching Pain Intervention(s): Limited activity within patient's tolerance    Home Living Family/patient expects to be discharged to::  Private residence Living Arrangements: Spouse/significant other Available Help at Discharge: Family Type of Home: House Home Access: Ramped entrance     Home Layout: One level Home Equipment: Environmental consultant - 2 wheels      Prior Function Level of Independence: Independent               Hand Dominance   Dominant Hand: Right    Extremity/Trunk Assessment   Upper Extremity Assessment: Overall WFL for tasks assessed           Lower Extremity Assessment: RLE deficits/detail RLE Deficits / Details: 3/5 quads with AAROM at knee -10 - 100    Cervical / Trunk Assessment: Normal  Communication   Communication: No difficulties  Cognition Arousal/Alertness: Awake/alert Behavior During Therapy: WFL for tasks assessed/performed Overall Cognitive Status: Within Functional Limits for tasks assessed                      General Comments      Exercises Total Joint Exercises Ankle Circles/Pumps: AROM;Both;Supine;10 reps Quad Sets: AROM;Both;10 reps;Supine Heel Slides: AAROM;Right;Supine;15 reps Straight Leg Raises: AROM;AAROM;Right;Supine;10 reps      Assessment/Plan    PT Assessment Patient needs continued PT services  PT Diagnosis Difficulty walking   PT Problem List Decreased strength;Decreased range of motion;Decreased activity tolerance;Decreased mobility;Decreased knowledge of use of DME;Pain  PT Treatment Interventions DME instruction;Gait training;Stair training;Functional mobility  training;Therapeutic activities;Therapeutic exercise;Patient/family education   PT Goals (Current goals can be found in the Care Plan section) Acute Rehab PT Goals 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  PT Goal Formulation: With patient Time For Goal Achievement: 09/12/13 Potential to Achieve Goals: Good    Frequency 7X/week   Barriers to discharge        Co-evaluation               End of Session Equipment Utilized During Treatment: Gait belt Activity  Tolerance: Patient tolerated treatment well Patient left: in chair;with call bell/phone within reach;with family/visitor present Nurse Communication: Mobility status         Time: 2202-5427 PT Time Calculation (min): 31 min   Charges:   PT Evaluation $Initial PT Evaluation Tier I: 1 Procedure PT Treatments $Gait Training: 8-22 mins $Therapeutic Exercise: 8-22 mins   PT G Codes:          Trenten Watchman 09/05/2013, 12:05 PM

## 2013-09-05 NOTE — Evaluation (Signed)
Occupational Therapy Evaluation Patient Details Name: Frank Dodson MRN: [id number] DOB: Oct 31, 1950 Today's Date: 10/02/2013    History of Present Illness R TKR; L TKR (11)   Clinical Impression   All education regarding ADL activity s/p TKR complete.  No further OT needed    Follow Up Recommendations  No OT follow up    Equipment Recommendations  None recommended by OT       Precautions / Restrictions Precautions Precautions: Knee;Fall Restrictions Weight Bearing Restrictions: No Other Position/Activity Restrictions: WBAT      Mobility Bed Mobility               General bed mobility comments: pt in chair  Transfers Overall transfer level: Needs assistance Equipment used: Rolling walker (2 wheeled) Transfers: Sit to/from Stand Sit to Stand: Supervision                   ADL Overall ADL's : Needs assistance/impaired     Grooming: Standing;Supervision/safety                   Toilet Transfer: Supervision/safety;RW;Comfort height toilet   Toileting- Clothing Manipulation and Hygiene: Supervision/safety;Sit to/from stand   Tub/ Shower Transfer: Walk-in shower;Rolling walker;Supervision/safety   Functional mobility during ADLs: Supervision/safety General ADL Comments: verbal cues for safety               Pertinent Vitals/Pain Pain Score: 2  Pain Location: R knee Pain Descriptors / Indicators: Sore     Hand Dominance Right   Extremity/Trunk Assessment Upper Extremity Assessment Upper Extremity Assessment: Overall WFL for tasks assessed           Communication Communication Communication: No difficulties   Cognition Arousal/Alertness: Awake/alert Behavior During Therapy: WFL for tasks assessed/performed Overall Cognitive Status: Within Functional Limits for tasks assessed                     General Comments   pt doing well and wife will be home with pt            Home Living Family/patient expects to be  discharged to:: Private residence Living Arrangements: Spouse/significant other Available Help at Discharge: Family Type of Home: House Home Access: Ramped entrance     Home Layout: One level     Bathroom Shower/Tub: Occupational psychologist: Handicapped height     Home Equipment: Environmental consultant - 2 wheels          Prior Functioning/Environment Level of Independence: Independent                      OT Goals(Current goals can be found in the care plan section) Acute Rehab OT Goals 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

## 2013-09-05 NOTE — Progress Notes (Signed)
CARE MANAGEMENT NOTE 09/05/2013  Patient:  Frank Dodson, Frank Dodson   Account Number:  [id number]  Date Initiated:  09/05/2013  Documentation initiated by:  DAVIS,RHONDA  Subjective/Objective Assessment:   rt tka     Action/Plan:   home with home hhc/has all equipment at home   Anticipated DC Date:  09/05/2013   Anticipated DC Plan:  [place] referral  NA      [place]  CM consult      [facility] Choice  NA   Choice offered to / List presented to:  C-1 Patient   DME arranged  NA      DME agency  NA     [place] arranged  HH-2 PT      [place]   Status of service:  In process, will continue to follow Medicare Important Message given?  NA - LOS <3 / Initial given by admissions (If response is "NO", the following Medicare IM given date fields will be blank) Date Medicare IM given:   Medicare IM given by:   Date Additional Medicare IM given:   Additional Medicare IM given by:    Discharge Disposition:    Per UR Regulation:  Reviewed for med. necessity/level of care/duration of stay  If discussed at [place] of Stay Meetings, dates discussed:    Comments:  Suanne Marker Davis,RN,BSN,CCM

## 2013-09-05 NOTE — Progress Notes (Signed)
Physical Therapy Treatment Patient Details Name: Frank Dodson MRN: [id number] DOB: [birth year] Today's Date: 2013/09/10    History of Present Illness R TKR; L TKR (11)    PT Comments      Follow Up Recommendations  Home health PT     Equipment Recommendations  None recommended by PT    Recommendations for Other Services OT consult     Precautions / Restrictions Precautions Precautions: Knee;Fall Restrictions Weight Bearing Restrictions: No Other Position/Activity Restrictions: WBAT    Mobility  Bed Mobility               General bed mobility comments: pt in chair  Transfers Overall transfer level: Needs assistance Equipment used: Rolling walker (2 wheeled) Transfers: Sit to/from Stand Sit to Stand: Supervision         General transfer comment: cues for LE management and use of UEs to self assist  Ambulation/Gait Ambulation/Gait assistance: Supervision Ambulation Distance (Feet): 150 Feet Assistive device: Rolling walker (2 wheeled) Gait Pattern/deviations: Step-to pattern;Shuffle;Trunk flexed;Decreased step length - right;Decreased step length - left;Narrow base of support     General Gait Details: cues for sequence, posture and position from [employer]            Wheelchair Mobility    Modified Rankin (Stroke Patients Only)       Balance                                    Cognition Arousal/Alertness: Awake/alert Behavior During Therapy: WFL for tasks assessed/performed Overall Cognitive Status: Within Functional Limits for tasks assessed                      Exercises Total Joint Exercises Ankle Circles/Pumps: AROM;Both;Supine;10 reps Quad Sets: AROM;Both;10 reps;Supine Heel Slides: AAROM;Right;Supine;15 reps Straight Leg Raises: AROM;AAROM;Right;Supine;10 reps Goniometric ROM: -10 - 110    General Comments        Pertinent Vitals/Pain Pain Assessment: 0-10 Pain Score: 3  Pain Location: R  knee Pain Descriptors / Indicators: Sore    Home Living Family/patient expects to be discharged to:: Private residence Living Arrangements: Spouse/significant other Available Help at Discharge: Family Type of Home: House Home Access: Ramped entrance   Home Layout: One level Home Equipment: Environmental consultant - 2 wheels      Prior Function Level of Independence: Independent          PT Goals (current goals can now be found in the care plan section) Acute Rehab PT Goals 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  PT Goal Formulation: With patient Time For Goal Achievement: 09/12/13 Potential to Achieve Goals: Good Progress towards PT goals: Progressing toward goals    Frequency  7X/week    PT Plan Current plan remains appropriate    Co-evaluation             End of Session Equipment Utilized During Treatment: Gait belt Activity Tolerance: Patient tolerated treatment well Patient left: in chair;with call bell/phone within reach;with family/visitor present     Time: 2353-6144 PT Time Calculation (min): 23 min  Charges:  $Gait Training: 8-22 mins $Therapeutic Exercise: 8-22 mins                    G Codes:      Cartez Dodson 2013/09/10, 2:48 PM

## 2013-09-05 NOTE — Progress Notes (Signed)
     Subjective: 1 Day Post-Op Procedure(s) (LRB): RIGHT TOTAL KNEE ARTHROPLASTY (Right)   Patient reports pain as mild, pain controlled. No events throughout the night. Ready to be discharged home if he does well with PT and pain stays controlled.   Objective:   VITALS:   Filed Vitals:   09/05/13 0520  BP: 111/67  Pulse: 63  Temp: 98 F (36.7 C)  Resp: 16    Dorsiflexion/Plantar flexion intact Incision: dressing C/D/I No cellulitis present Compartment soft  LABS  Recent Labs  09/05/13 0419  HGB 9.8*  HCT 27.9*  WBC 14.1*  PLT 235     Recent Labs  09/05/13 0419  NA 138  K 4.4  BUN 17  CREATININE 1.01  GLUCOSE 165*     Assessment/Plan: 1 Day Post-Op Procedure(s) (LRB): RIGHT TOTAL KNEE ARTHROPLASTY (Right) Foley cath d/c'ed Advance diet Up with therapy D/C IV fluids Discharge home with home health Follow up in 2 weeks at [facility]. Follow up with OLIN,Dimitris Shanahan D in 2 weeks.  Contact information:  [facility] [address] [place] [phone]    Expected ABLA  Treated with iron and will observe        West Pugh. Notnamed Croucher   PAC  09/05/2013, 9:14 AM

## 2013-09-07 NOTE — Anesthesia Postprocedure Evaluation (Signed)
  Anesthesia Post-op Note  Patient: Frank Dodson  Procedure(s) Performed: Procedure(s) (LRB): RIGHT TOTAL KNEE ARTHROPLASTY (Right)  Patient Location: PACU  Anesthesia Type: Spinal  Level of Consciousness: awake and alert   Airway and Oxygen Therapy: Patient Spontanous Breathing  Post-op Pain: mild  Post-op Assessment: Post-op Vital signs reviewed, Patient's Cardiovascular Status Stable, Respiratory Function Stable, Patent Airway and No signs of Nausea or vomiting  Last Vitals:  Filed Vitals:   09/05/13 1100  BP: 125/68  Pulse: 66  Temp: 36.8 C  Resp: 16    Post-op Vital Signs: stable   Complications: No apparent anesthesia complications

## 2013-09-10 NOTE — Discharge Summary (Signed)
Physician Discharge Summary  Patient ID: Frank Dodson MRN: [id number] DOB/AGE: 19-Feb-1950 63 y.o.  Admit date: 09/04/2013 Discharge date: 09/05/2013   Procedures:  Procedure(s) (LRB): RIGHT TOTAL KNEE ARTHROPLASTY (Right)  Attending Physician:  Dr. Paralee Cancel   Admission Diagnoses:   Right knee OA / pain  Discharge Diagnoses:  Patient Active Problem List   Diagnosis Date Noted  . Expected blood loss anemia 09/05/2013  . S/P right TKA 09/04/2013   Past Medical History  Diagnosis Date  . Crohn's colitis     06-28-13 under treatment for recent flare up-  . Frequent bowel movements     06-28-13  extreme urgency with bowel movements "3-5" per day or more  . Hypertension   . GERD (gastroesophageal reflux disease)   . Arthritis     Rheumatoid , osteoarthritis  . Complication of anesthesia   . PONV (postoperative nausea and vomiting)     "with ether"  . Rotator cuff tear     left - limited motion of arm  . Eczema   . History of gout     HPI: Frank Dodson, 63 y.o. male, has a history of pain and functional disability in the right knee due to arthritis and has failed non-surgical conservative treatments for greater than 12 weeks to includeNSAID's and/or analgesics, corticosteriod injections, viscosupplementation injections and activity modification. Onset of symptoms was gradual, starting years ago with gradually worsening course since that time. The patient noted prior procedures on the knee to include arthroplasty on the left knee per Dr. Alvan Dame in 12/2009. Patient currently rates pain in the right knee(s) at 6 out of 10 with activity. Patient has worsening of pain with activity and weight bearing, pain that interferes with activities of daily living, pain with passive range of motion, crepitus and joint swelling. Patient has evidence of periarticular osteophytes and joint space narrowing by imaging studies. There is no active infection. Risks, benefits and expectations were discussed  with the patient. Risks including but not limited to the risk of anesthesia, blood clots, nerve damage, blood vessel damage, failure of the prosthesis, infection and up to and including death. Patient understand the risks, benefits and expectations and wishes to proceed with surgery.   PCP: Gerrit Heck, MD   Discharged Condition: good  Hospital Course:  Patient underwent the above stated procedure on 09/04/2013. Patient tolerated the procedure well and brought to the recovery room in good condition and subsequently to the floor.  POD #1 BP: 111/67 ; Pulse: 63 ; Temp: 98 F (36.7 C) ; Resp: 16 Patient reports pain as mild, pain controlled. No events throughout the night. Ready to be discharged home.  Dorsiflexion/plantar flexion intact, incision: dressing C/D/I, no cellulitis present and compartment soft.   LABS  Basename    HGB  9.8  HCT  27.9    Discharge Exam: General appearance: alert, cooperative and no distress Extremities: Homans sign is negative, no sign of DVT, no edema, redness or tenderness in the calves or thighs and no ulcers, gangrene or trophic changes  Disposition: Home with follow up in 2 weeks   Follow-up Information   Follow up with Mauri Pole, MD. Schedule an appointment as soon as possible for a visit in 2 weeks.   Specialty:  Orthopedic Surgery   Contact information:   [address] [place] [address] [phone]       Discharge Instructions   Call MD / Call 911    Complete by:  As  directed   If you experience chest pain or shortness of breath, CALL 911 and be transported to the hospital emergency room.  If you develope a fever above 101 F, pus (white drainage) or increased drainage or redness at the wound, or calf pain, call your surgeon's office.     Change dressing    Complete by:  As directed   Maintain surgical dressing for 10-14 days, or until follow up in the clinic.     Constipation Prevention    Complete  by:  As directed   Drink plenty of fluids.  Prune juice may be helpful.  You may use a stool softener, such as Colace (over the counter) 100 mg twice a day.  Use MiraLax (over the counter) for constipation as needed.     Diet - low sodium heart healthy    Complete by:  As directed      Discharge instructions    Complete by:  As directed   Maintain surgical dressing for 10-14 days, or until follow up in the clinic. Follow up in 2 weeks at [facility]. Call with any questions or concerns.     Driving restrictions    Complete by:  As directed   No driving for 4 weeks     Increase activity slowly as tolerated    Complete by:  As directed      TED hose    Complete by:  As directed   Use stockings (TED hose) for 2 weeks on both leg(s).  You may remove them at night for sleeping.     Weight bearing as tolerated    Complete by:  As directed   Laterality:  right  Extremity:  Lower             Medication List    STOP taking these medications       ibuprofen 200 MG tablet  Commonly known as:  ADVIL,MOTRIN     inFLIXimab 100 MG injection  Commonly known as:  REMICADE     traMADol 50 MG tablet  Commonly known as:  ULTRAM      TAKE these medications       allopurinol 100 MG tablet  Commonly known as:  ZYLOPRIM  Take 200 mg by mouth daily.     aspirin 325 MG EC tablet  Take 1 tablet (325 mg total) by mouth 2 (two) times daily.     colchicine 0.6 MG tablet  Take 0.6 mg by mouth every other day.     doxazosin 4 MG tablet  Commonly known as:  CARDURA  Take 4 mg by mouth every evening.     DSS 100 MG Caps  Take 100 mg by mouth 2 (two) times daily.     esomeprazole 40 MG capsule  Commonly known as:  NEXIUM  Take 40 mg by mouth daily.     ferrous fumarate 325 (106 FE) MG Tabs tablet  Commonly known as:  HEMOCYTE - 106 mg FE  Take 1 tablet by mouth 2 (two) times daily.     HYDROcodone-acetaminophen 7.5-325 MG per tablet  Commonly known as:  NORCO  Take 1-2  tablets by mouth every 4 (four) hours as needed for moderate pain.     mesalamine 1.2 G EC tablet  Commonly known as:  LIALDA  Take 4.8 g by mouth daily with breakfast.     methocarbamol 500 MG tablet  Commonly known as:  ROBAXIN  Take 1 tablet (500 mg total) by mouth  every 6 (six) hours as needed for muscle spasms.     multivitamin with minerals Tabs tablet  Take 1 tablet by mouth daily.     polyethylene glycol packet  Commonly known as:  MIRALAX / GLYCOLAX  Take 17 g by mouth 2 (two) times daily.     predniSONE 5 MG tablet  Commonly known as:  DELTASONE  Take 5 mg by mouth every other day.     quinapril 40 MG tablet  Commonly known as:  ACCUPRIL  Take 40 mg by mouth 2 (two) times daily.     saccharomyces boulardii 250 MG capsule  Commonly known as:  FLORASTOR  Take 250 mg by mouth 2 (two) times daily. 06-28-13 States needed daily for recent "flare up" with Chron's"     vancomycin 250 MG capsule  Commonly known as:  VANCOCIN  Take 500 mg by mouth daily. 06-28-13 states "needed as prescribed for recent "falre up  With Chron's"     vitamin C 500 MG tablet  Commonly known as:  ASCORBIC ACID  Take 500 mg by mouth daily.     zolpidem 10 MG tablet  Commonly known as:  AMBIEN  Take 10 mg by mouth at bedtime as needed for sleep.         Signed: West Pugh. Donne Robillard   PA-C  09/10/2013, 9:35 AM

## 2014-08-29 ENCOUNTER — Other Ambulatory Visit: Payer: Self-pay | Admitting: Rheumatology

## 2014-08-29 DIAGNOSIS — K50919 Crohn's disease, unspecified, with unspecified complications: Secondary | ICD-10-CM

## 2014-09-09 ENCOUNTER — Other Ambulatory Visit: Payer: Self-pay

## 2014-09-10 ENCOUNTER — Ambulatory Visit
Admission: RE | Admit: 2014-09-10 | Discharge: 2014-09-10 | Disposition: A | Discharge status: Home / Self Care | Payer: BLUE CROSS/BLUE SHIELD | Source: Ambulatory Visit | Attending: Rheumatology | Admitting: Rheumatology

## 2014-09-10 DIAGNOSIS — K50919 Crohn's disease, unspecified, with unspecified complications: Secondary | ICD-10-CM

## 2014-10-12 HISTORY — PX: LAPAROSCOPIC PARTIAL PROTECTOMY: SHX5912

## 2014-11-12 HISTORY — PX: ENTEROSTOMY CLOSURE: SUR260

## 2015-05-06 NOTE — Pre-Procedure Instructions (Addendum)
    Frank Dodson  05/06/2015      [id number] - [place] [place] - [address] [name] [address] Phone: [phone] Fax: [phone]    Your procedure is scheduled on Thursday, May 4th, 2017.  Report to [facility] Admitting at 5:30 A.M.   Call this number if you have problems the morning of surgery:  [phone]   Remember:  Do not eat food or drink liquids after midnight.   Take these medicines the morning of surgery with A SIP OF WATER: Acetaminophen (Tylenol) if needed, Allopurinol (Zyloprim),  Esomeprazole (Nexium), Hydrocodone-acetaminophen (Norco) if needed,  Prednisone (Deltasone), Tramadol (Ultram) if needed  Stop taking: Aspirin, NSAIDS, Aleve, Naproxen, Ibuprofen, Advil, Motrin, BC's, Goody's, Fish oil, all herbal medications, and all vitamins.    Do not wear jewelry.  Do not wear lotions, powders, or cologones.  You may NOT wear deodorant.  Men may shave face and neck.  Do not bring valuables to the hospital.  [facility] is not responsible for any belongings or valuables.  Contacts, dentures or bridgework may not be worn into surgery.  Leave your suitcase in the car.  After surgery it may be brought to your room.  For patients admitted to the hospital, discharge time will be determined by your treatment team.  Patients discharged the day of surgery will not be allowed to drive home.   Special instructions:  See attached.   Please read over the following fact sheets that you were given. Pain Booklet, Coughing and Deep Breathing, MRSA Information and Surgical Site Infection Prevention

## 2015-05-07 ENCOUNTER — Encounter (HOSPITAL_COMMUNITY)
Admission: RE | Admit: 2015-05-07 | Discharge: 2015-05-07 | Disposition: A | Discharge status: Home / Self Care | Payer: BLUE CROSS/BLUE SHIELD | Source: Ambulatory Visit | Attending: Orthopedic Surgery | Admitting: Orthopedic Surgery

## 2015-05-07 ENCOUNTER — Encounter (HOSPITAL_COMMUNITY): Payer: Self-pay

## 2015-05-07 DIAGNOSIS — M12812 Other specific arthropathies, not elsewhere classified, left shoulder: Secondary | ICD-10-CM | POA: Insufficient documentation

## 2015-05-07 DIAGNOSIS — I10 Essential (primary) hypertension: Secondary | ICD-10-CM | POA: Diagnosis not present

## 2015-05-07 DIAGNOSIS — Z01818 Encounter for other preprocedural examination: Secondary | ICD-10-CM | POA: Diagnosis not present

## 2015-05-07 DIAGNOSIS — Z01812 Encounter for preprocedural laboratory examination: Secondary | ICD-10-CM | POA: Insufficient documentation

## 2015-05-07 HISTORY — DX: Fecal urgency: R15.2

## 2015-05-07 LAB — BASIC METABOLIC PANEL
ANION GAP: 9 (ref 5–15)
BUN: 14 mg/dL (ref 6–20)
CO2: 26 mmol/L (ref 22–32)
Calcium: 9.6 mg/dL (ref 8.9–10.3)
Chloride: 107 mmol/L (ref 101–111)
Creatinine, Ser: 1.1 mg/dL (ref 0.61–1.24)
Glucose, Bld: 101 mg/dL — ABNORMAL HIGH (ref 65–99)
POTASSIUM: 4.6 mmol/L (ref 3.5–5.1)
Sodium: 142 mmol/L (ref 135–145)

## 2015-05-07 LAB — CBC
HEMATOCRIT: 34.9 % — AB (ref 39.0–52.0)
HEMOGLOBIN: 10.8 g/dL — AB (ref 13.0–17.0)
MCH: 28.3 pg (ref 26.0–34.0)
MCHC: 30.9 g/dL (ref 30.0–36.0)
MCV: 91.6 fL (ref 78.0–100.0)
Platelets: 224 10*3/uL (ref 150–400)
RBC: 3.81 MIL/uL — ABNORMAL LOW (ref 4.22–5.81)
RDW: 17.3 % — AB (ref 11.5–15.5)
WBC: 8.2 10*3/uL (ref 4.0–10.5)

## 2015-05-07 LAB — SURGICAL PCR SCREEN
MRSA, PCR: NEGATIVE
Staphylococcus aureus: NEGATIVE

## 2015-05-07 NOTE — Progress Notes (Signed)
PCP - Leighton Ruff Cardiologist - denies  EKG - 05/07/15 CXR - denies  Echo/stress test/cardiac cath - denies  Patient denies chest pain and shortness of breath at PAT appointment.

## 2015-05-14 MED ORDER — TRANEXAMIC ACID 1000 MG/10ML IV SOLN
1000.0000 mg | INTRAVENOUS | Status: AC
  Administered 2015-05-15: 1000 mg via INTRAVENOUS
  Filled 2015-05-14: qty 10

## 2015-05-14 NOTE — Anesthesia Preprocedure Evaluation (Addendum)
Anesthesia Evaluation  Patient identified by MRN, date of birth, ID band Patient awake    Reviewed: Allergy & Precautions, H&P , NPO status , Patient's Chart, lab work & pertinent test results  History of Anesthesia Complications (+) PONVNegative for: history of anesthetic complications  Airway Mallampati: II  TM Distance: >3 FB Neck ROM: Full    Dental  (+) Dental Advisory Given, Caps Cap right upper front:   Pulmonary neg pulmonary ROS,    Pulmonary exam normal breath sounds clear to auscultation       Cardiovascular hypertension, Pt. on medications Normal cardiovascular exam Rhythm:Regular Rate:Normal     Neuro/Psych negative neurological ROS  negative psych ROS   GI/Hepatic Neg liver ROS, GERD  Medicated and Controlled,  Endo/Other  negative endocrine ROS  Renal/GU negative Renal ROS  negative genitourinary   Musculoskeletal  (+) Arthritis , Rheumatoid disorders,  Chronic steroids   Abdominal   Peds negative pediatric ROS (+)  Hematology negative hematology ROS (+) anemia , hgb 10.8   Anesthesia Other Findings   Reproductive/Obstetrics negative OB ROS                           Anesthesia Physical Anesthesia Plan  ASA: III  Anesthesia Plan: General   Post-op Pain Management: GA combined w/ Regional for post-op pain   Induction: Intravenous  Airway Management Planned: Oral ETT  Additional Equipment:   Intra-op Plan:   Post-operative Plan: Extubation in OR  Informed Consent:   Plan Discussed with: Surgeon  Anesthesia Plan Comments:         Anesthesia Quick Evaluation

## 2015-05-15 ENCOUNTER — Observation Stay (HOSPITAL_COMMUNITY)
Admission: RE | Admit: 2015-05-15 | Discharge: 2015-05-16 | Disposition: A | Discharge status: Home / Self Care | Payer: BLUE CROSS/BLUE SHIELD | Source: Ambulatory Visit | Attending: Orthopedic Surgery | Admitting: Orthopedic Surgery

## 2015-05-15 ENCOUNTER — Encounter (HOSPITAL_COMMUNITY)
Admission: RE | Disposition: A | Discharge status: Home / Self Care | Payer: Self-pay | Source: Ambulatory Visit | Attending: Orthopedic Surgery

## 2015-05-15 ENCOUNTER — Ambulatory Visit (HOSPITAL_COMMUNITY): Payer: BLUE CROSS/BLUE SHIELD | Admitting: Anesthesiology

## 2015-05-15 ENCOUNTER — Encounter (HOSPITAL_COMMUNITY): Payer: Self-pay | Admitting: *Deleted

## 2015-05-15 DIAGNOSIS — K5090 Crohn's disease, unspecified, without complications: Secondary | ICD-10-CM | POA: Insufficient documentation

## 2015-05-15 DIAGNOSIS — Z96612 Presence of left artificial shoulder joint: Secondary | ICD-10-CM

## 2015-05-15 DIAGNOSIS — D509 Iron deficiency anemia, unspecified: Secondary | ICD-10-CM | POA: Diagnosis not present

## 2015-05-15 DIAGNOSIS — Z96653 Presence of artificial knee joint, bilateral: Secondary | ICD-10-CM | POA: Diagnosis not present

## 2015-05-15 DIAGNOSIS — I10 Essential (primary) hypertension: Secondary | ICD-10-CM | POA: Insufficient documentation

## 2015-05-15 DIAGNOSIS — M109 Gout, unspecified: Secondary | ICD-10-CM | POA: Insufficient documentation

## 2015-05-15 DIAGNOSIS — Z79899 Other long term (current) drug therapy: Secondary | ICD-10-CM | POA: Insufficient documentation

## 2015-05-15 DIAGNOSIS — M069 Rheumatoid arthritis, unspecified: Secondary | ICD-10-CM | POA: Diagnosis not present

## 2015-05-15 DIAGNOSIS — K219 Gastro-esophageal reflux disease without esophagitis: Secondary | ICD-10-CM | POA: Insufficient documentation

## 2015-05-15 DIAGNOSIS — M75102 Unspecified rotator cuff tear or rupture of left shoulder, not specified as traumatic: Secondary | ICD-10-CM | POA: Diagnosis present

## 2015-05-15 DIAGNOSIS — M75122 Complete rotator cuff tear or rupture of left shoulder, not specified as traumatic: Secondary | ICD-10-CM | POA: Diagnosis not present

## 2015-05-15 DIAGNOSIS — Z9049 Acquired absence of other specified parts of digestive tract: Secondary | ICD-10-CM | POA: Insufficient documentation

## 2015-05-15 DIAGNOSIS — Z96619 Presence of unspecified artificial shoulder joint: Secondary | ICD-10-CM

## 2015-05-15 DIAGNOSIS — Z7952 Long term (current) use of systemic steroids: Secondary | ICD-10-CM | POA: Insufficient documentation

## 2015-05-15 DIAGNOSIS — M19012 Primary osteoarthritis, left shoulder: Secondary | ICD-10-CM | POA: Diagnosis not present

## 2015-05-15 HISTORY — PX: REVERSE SHOULDER ARTHROPLASTY: SHX5054

## 2015-05-15 HISTORY — DX: Pneumonia, unspecified organism: J18.9

## 2015-05-15 HISTORY — DX: Unspecified osteoarthritis, unspecified site: M19.90

## 2015-05-15 HISTORY — DX: Rheumatoid arthritis, unspecified: M06.9

## 2015-05-15 HISTORY — DX: Stiffness of unspecified shoulder, not elsewhere classified: M25.619

## 2015-05-15 HISTORY — DX: Iron deficiency anemia, unspecified: D50.9

## 2015-05-15 SURGERY — ARTHROPLASTY, SHOULDER, TOTAL, REVERSE
Anesthesia: General | Site: Shoulder | Laterality: Left

## 2015-05-15 MED ORDER — METOCLOPRAMIDE HCL 5 MG PO TABS: 5.0000 mg | ORAL_TABLET | Freq: Three times a day (TID) | ORAL | Status: DC | PRN

## 2015-05-15 MED ORDER — PHENOL 1.4 % MT LIQD: 1.0000 | OROMUCOSAL | Status: DC | PRN

## 2015-05-15 MED ORDER — SODIUM CHLORIDE 0.9 % IV SOLN
10.0000 mg | INTRAVENOUS | Status: DC | PRN
  Administered 2015-05-15: 50 ug/min via INTRAVENOUS

## 2015-05-15 MED ORDER — ZOLPIDEM TARTRATE 5 MG PO TABS: 10.0000 mg | ORAL_TABLET | Freq: Every evening | ORAL | Status: DC | PRN

## 2015-05-15 MED ORDER — NEOSTIGMINE METHYLSULFATE 10 MG/10ML IV SOLN
INTRAVENOUS | Status: DC | PRN
  Administered 2015-05-15: 3 mg via INTRAVENOUS

## 2015-05-15 MED ORDER — GLYCOPYRROLATE 0.2 MG/ML IV SOSY
PREFILLED_SYRINGE | INTRAVENOUS | Status: AC
  Filled 2015-05-15: qty 6

## 2015-05-15 MED ORDER — ONDANSETRON HCL 4 MG PO TABS: 4.0000 mg | ORAL_TABLET | Freq: Four times a day (QID) | ORAL | Status: DC | PRN

## 2015-05-15 MED ORDER — FENTANYL CITRATE (PF) 250 MCG/5ML IJ SOLN
INTRAMUSCULAR | Status: AC
  Filled 2015-05-15: qty 5

## 2015-05-15 MED ORDER — ALLOPURINOL 100 MG PO TABS
100.0000 mg | ORAL_TABLET | Freq: Two times a day (BID) | ORAL | Status: DC
  Administered 2015-05-15 – 2015-05-16 (×2): 100 mg via ORAL
  Filled 2015-05-15 (×2): qty 1

## 2015-05-15 MED ORDER — LACTATED RINGERS IV SOLN: INTRAVENOUS | Status: DC

## 2015-05-15 MED ORDER — GLYCOPYRROLATE 0.2 MG/ML IV SOSY
PREFILLED_SYRINGE | INTRAVENOUS | Status: AC
  Filled 2015-05-15: qty 3

## 2015-05-15 MED ORDER — ONDANSETRON HCL 4 MG/2ML IJ SOLN
INTRAMUSCULAR | Status: DC | PRN
  Administered 2015-05-15: 4 mg via INTRAVENOUS

## 2015-05-15 MED ORDER — DEXAMETHASONE SODIUM PHOSPHATE 4 MG/ML IJ SOLN
INTRAMUSCULAR | Status: DC | PRN
  Administered 2015-05-15: 4 mg via INTRAVENOUS

## 2015-05-15 MED ORDER — MAGNESIUM GLUCONATE 500 MG PO TABS
500.0000 mg | ORAL_TABLET | Freq: Every day | ORAL | Status: DC
  Filled 2015-05-15: qty 1

## 2015-05-15 MED ORDER — MIDAZOLAM HCL 5 MG/5ML IJ SOLN
INTRAMUSCULAR | Status: DC | PRN
  Administered 2015-05-15: 2 mg via INTRAVENOUS

## 2015-05-15 MED ORDER — DOCUSATE SODIUM 100 MG PO CAPS
100.0000 mg | ORAL_CAPSULE | Freq: Two times a day (BID) | ORAL | Status: DC
  Filled 2015-05-15 (×2): qty 1

## 2015-05-15 MED ORDER — DOXAZOSIN MESYLATE 4 MG PO TABS
4.0000 mg | ORAL_TABLET | Freq: Every evening | ORAL | Status: DC
  Administered 2015-05-15: 4 mg via ORAL
  Filled 2015-05-15 (×2): qty 1

## 2015-05-15 MED ORDER — MIDAZOLAM HCL 2 MG/2ML IJ SOLN
INTRAMUSCULAR | Status: AC
  Filled 2015-05-15: qty 2

## 2015-05-15 MED ORDER — POLYETHYLENE GLYCOL 3350 17 G PO PACK: 17.0000 g | PACK | Freq: Every day | ORAL | Status: DC | PRN

## 2015-05-15 MED ORDER — ALUM & MAG HYDROXIDE-SIMETH 200-200-20 MG/5ML PO SUSP: 30.0000 mL | ORAL | Status: DC | PRN

## 2015-05-15 MED ORDER — NEOSTIGMINE METHYLSULFATE 5 MG/5ML IV SOSY
PREFILLED_SYRINGE | INTRAVENOUS | Status: AC
  Filled 2015-05-15: qty 5

## 2015-05-15 MED ORDER — LIDOCAINE HCL (CARDIAC) 20 MG/ML IV SOLN
INTRAVENOUS | Status: DC | PRN
  Administered 2015-05-15: 70 mg via INTRAVENOUS

## 2015-05-15 MED ORDER — ACETAMINOPHEN 650 MG RE SUPP: 650.0000 mg | Freq: Four times a day (QID) | RECTAL | Status: DC | PRN

## 2015-05-15 MED ORDER — GLYCOPYRROLATE 0.2 MG/ML IJ SOLN
INTRAMUSCULAR | Status: DC | PRN
  Administered 2015-05-15: 0.2 mg via INTRAVENOUS
  Administered 2015-05-15: 0.4 mg via INTRAVENOUS

## 2015-05-15 MED ORDER — MENTHOL 3 MG MT LOZG: 1.0000 | LOZENGE | OROMUCOSAL | Status: DC | PRN

## 2015-05-15 MED ORDER — ONDANSETRON HCL 4 MG/2ML IJ SOLN
4.0000 mg | Freq: Four times a day (QID) | INTRAMUSCULAR | Status: DC | PRN
  Administered 2015-05-16: 4 mg via INTRAVENOUS
  Filled 2015-05-15: qty 2

## 2015-05-15 MED ORDER — DEXAMETHASONE SODIUM PHOSPHATE 10 MG/ML IJ SOLN
INTRAMUSCULAR | Status: AC
  Filled 2015-05-15: qty 1

## 2015-05-15 MED ORDER — ONDANSETRON HCL 4 MG/2ML IJ SOLN
INTRAMUSCULAR | Status: AC
  Filled 2015-05-15: qty 2

## 2015-05-15 MED ORDER — BISACODYL 5 MG PO TBEC: 5.0000 mg | DELAYED_RELEASE_TABLET | Freq: Every day | ORAL | Status: DC | PRN

## 2015-05-15 MED ORDER — LACTATED RINGERS IV SOLN
INTRAVENOUS | Status: DC | PRN
  Administered 2015-05-15: 07:00:00 via INTRAVENOUS

## 2015-05-15 MED ORDER — MAGNESIUM CITRATE PO SOLN: 1.0000 | Freq: Once | ORAL | Status: DC | PRN

## 2015-05-15 MED ORDER — ROCURONIUM BROMIDE 100 MG/10ML IV SOLN
INTRAVENOUS | Status: DC | PRN
  Administered 2015-05-15: 50 mg via INTRAVENOUS

## 2015-05-15 MED ORDER — OXYCODONE HCL 5 MG PO TABS
5.0000 mg | ORAL_TABLET | ORAL | Status: DC | PRN
  Administered 2015-05-15 – 2015-05-16 (×3): 10 mg via ORAL
  Filled 2015-05-15 (×3): qty 2

## 2015-05-15 MED ORDER — METOCLOPRAMIDE HCL 5 MG/ML IJ SOLN: 5.0000 mg | Freq: Three times a day (TID) | INTRAMUSCULAR | Status: DC | PRN

## 2015-05-15 MED ORDER — CEFAZOLIN SODIUM-DEXTROSE 2-4 GM/100ML-% IV SOLN
2.0000 g | INTRAVENOUS | Status: AC
  Administered 2015-05-15: 2 g via INTRAVENOUS
  Filled 2015-05-15: qty 100

## 2015-05-15 MED ORDER — EPHEDRINE SULFATE 50 MG/ML IJ SOLN
INTRAMUSCULAR | Status: DC | PRN
  Administered 2015-05-15 (×2): 10 mg via INTRAVENOUS

## 2015-05-15 MED ORDER — CEFAZOLIN SODIUM-DEXTROSE 2-4 GM/100ML-% IV SOLN
2.0000 g | Freq: Four times a day (QID) | INTRAVENOUS | Status: AC
  Administered 2015-05-15 – 2015-05-16 (×3): 2 g via INTRAVENOUS
  Filled 2015-05-15 (×3): qty 100

## 2015-05-15 MED ORDER — PROPOFOL 10 MG/ML IV BOLUS
INTRAVENOUS | Status: AC
  Filled 2015-05-15: qty 20

## 2015-05-15 MED ORDER — EPHEDRINE 5 MG/ML INJ
INTRAVENOUS | Status: AC
  Filled 2015-05-15: qty 10

## 2015-05-15 MED ORDER — BUPIVACAINE-EPINEPHRINE (PF) 0.5% -1:200000 IJ SOLN
INTRAMUSCULAR | Status: DC | PRN
  Administered 2015-05-15: 30 mL via PERINEURAL

## 2015-05-15 MED ORDER — HYDROXYZINE HCL 25 MG PO TABS: 25.0000 mg | ORAL_TABLET | Freq: Three times a day (TID) | ORAL | Status: DC | PRN

## 2015-05-15 MED ORDER — ACETAMINOPHEN 325 MG PO TABS: 650.0000 mg | ORAL_TABLET | Freq: Four times a day (QID) | ORAL | Status: DC | PRN

## 2015-05-15 MED ORDER — HYDROMORPHONE HCL 1 MG/ML IJ SOLN: 0.2500 mg | INTRAMUSCULAR | Status: DC | PRN

## 2015-05-15 MED ORDER — SODIUM CHLORIDE 0.9 % IR SOLN
Status: DC | PRN
  Administered 2015-05-15: 1000 mL

## 2015-05-15 MED ORDER — HYDROMORPHONE HCL 1 MG/ML IJ SOLN
1.0000 mg | INTRAMUSCULAR | Status: DC | PRN
  Filled 2015-05-15: qty 1

## 2015-05-15 MED ORDER — LOPERAMIDE HCL 2 MG PO CAPS
2.0000 mg | ORAL_CAPSULE | Freq: Four times a day (QID) | ORAL | Status: DC | PRN
  Administered 2015-05-15 – 2015-05-16 (×3): 2 mg via ORAL
  Filled 2015-05-15 (×3): qty 1

## 2015-05-15 MED ORDER — LACTATED RINGERS IV SOLN
INTRAVENOUS | Status: DC
  Administered 2015-05-15: 12:00:00 via INTRAVENOUS

## 2015-05-15 MED ORDER — PROPOFOL 10 MG/ML IV BOLUS
INTRAVENOUS | Status: DC | PRN
  Administered 2015-05-15: 200 mg via INTRAVENOUS

## 2015-05-15 MED ORDER — QUINAPRIL HCL 10 MG PO TABS
40.0000 mg | ORAL_TABLET | Freq: Every day | ORAL | Status: DC
  Filled 2015-05-15: qty 4

## 2015-05-15 MED ORDER — PANTOPRAZOLE SODIUM 40 MG PO TBEC
40.0000 mg | DELAYED_RELEASE_TABLET | Freq: Every day | ORAL | Status: DC
  Administered 2015-05-16: 40 mg via ORAL
  Filled 2015-05-15: qty 1

## 2015-05-15 MED ORDER — CHLORHEXIDINE GLUCONATE 4 % EX LIQD: 60.0000 mL | Freq: Once | CUTANEOUS | Status: DC

## 2015-05-15 MED ORDER — FENTANYL CITRATE (PF) 100 MCG/2ML IJ SOLN
INTRAMUSCULAR | Status: DC | PRN
  Administered 2015-05-15: 50 ug via INTRAVENOUS

## 2015-05-15 MED ORDER — ROCURONIUM BROMIDE 50 MG/5ML IV SOLN
INTRAVENOUS | Status: AC
  Filled 2015-05-15: qty 1

## 2015-05-15 MED ORDER — PREDNISONE 5 MG PO TABS
5.0000 mg | ORAL_TABLET | Freq: Every day | ORAL | Status: DC
  Administered 2015-05-16: 5 mg via ORAL
  Filled 2015-05-15: qty 1

## 2015-05-15 SURGICAL SUPPLY — 76 items
ADH SKN CLS APL DERMABOND .7 (GAUZE/BANDAGES/DRESSINGS) ×1
AID PSTN UNV HD RSTRNT DISP (MISCELLANEOUS) ×1
BASEPLATE GLENOID SHLDR SM (Shoulder) ×2 IMPLANT
BLADE SAW SGTL 83.5X18.5 (BLADE) ×3 IMPLANT
BSPLAT GLND SM PRFT SHLDR CA (Shoulder) ×1 IMPLANT
COVER SURGICAL LIGHT HANDLE (MISCELLANEOUS) ×3 IMPLANT
CUP SUT UNIV REVERS 42 NEUT (Shoulder) ×1 IMPLANT
DERMABOND ADVANCED (GAUZE/BANDAGES/DRESSINGS) ×2
DERMABOND ADVANCED .7 DNX12 (GAUZE/BANDAGES/DRESSINGS) ×1 IMPLANT
DRAPE ORTHO SPLIT 77X108 STRL (DRAPES) ×6
DRAPE SURG 17X11 SM STRL (DRAPES) ×3 IMPLANT
DRAPE SURG ORHT 6 SPLT 77X108 (DRAPES) ×2 IMPLANT
DRAPE U-SHAPE 47X51 STRL (DRAPES) ×3 IMPLANT
DRSG AQUACEL AG ADV 3.5X10 (GAUZE/BANDAGES/DRESSINGS) ×3 IMPLANT
DRSG MEPILEX BORDER 4X8 (GAUZE/BANDAGES/DRESSINGS) IMPLANT
DURAPREP 26ML APPLICATOR (WOUND CARE) ×3 IMPLANT
ELECT BLADE 4.0 EZ CLEAN MEGAD (MISCELLANEOUS) ×3
ELECT CAUTERY BLADE 6.4 (BLADE) ×3 IMPLANT
ELECT REM PT RETURN 9FT ADLT (ELECTROSURGICAL) ×3
ELECTRODE BLDE 4.0 EZ CLN MEGD (MISCELLANEOUS) ×1 IMPLANT
ELECTRODE REM PT RTRN 9FT ADLT (ELECTROSURGICAL) ×1 IMPLANT
FACESHIELD WRAPAROUND (MASK) ×9 IMPLANT
FACESHIELD WRAPAROUND OR TEAM (MASK) ×3 IMPLANT
GLENOSPHERE UNI REV 42+4 LAT (Shoulder) ×1 IMPLANT
GLOVE BIO SURGEON STRL SZ7.5 (GLOVE) ×3 IMPLANT
GLOVE BIO SURGEON STRL SZ8 (GLOVE) ×3 IMPLANT
GLOVE BIOGEL PI IND STRL 6.5 (GLOVE) IMPLANT
GLOVE BIOGEL PI IND STRL 7.5 (GLOVE) IMPLANT
GLOVE BIOGEL PI INDICATOR 6.5 (GLOVE) ×4
GLOVE BIOGEL PI INDICATOR 7.5 (GLOVE) ×2
GLOVE EUDERMIC 7 POWDERFREE (GLOVE) ×3 IMPLANT
GLOVE SS BIOGEL STRL SZ 7.5 (GLOVE) ×1 IMPLANT
GLOVE SUPERSENSE BIOGEL SZ 7.5 (GLOVE) ×2
GLOVE SURG SS PI 6.0 STRL IVOR (GLOVE) ×2 IMPLANT
GOWN STRL REUS W/ TWL LRG LVL3 (GOWN DISPOSABLE) IMPLANT
GOWN STRL REUS W/ TWL XL LVL3 (GOWN DISPOSABLE) ×2 IMPLANT
GOWN STRL REUS W/TWL LRG LVL3 (GOWN DISPOSABLE)
GOWN STRL REUS W/TWL XL LVL3 (GOWN DISPOSABLE) ×6
HANDPIECE INTERPULSE COAX TIP (DISPOSABLE)
INSERT HUMERAL L/42+3 IN 42CUP (Shoulder) ×2 IMPLANT
KIT BASIN OR (CUSTOM PROCEDURE TRAY) ×3 IMPLANT
KIT ROOM TURNOVER OR (KITS) ×3 IMPLANT
MANIFOLD NEPTUNE II (INSTRUMENTS) ×3 IMPLANT
NDL 1/2 CIR CATGUT .05X1.09 (NEEDLE) ×1 IMPLANT
NDL HYPO 25GX1X1/2 BEV (NEEDLE) IMPLANT
NDL SUT 6 .5 CRC .975X.05 MAYO (NEEDLE) IMPLANT
NEEDLE 1/2 CIR CATGUT .05X1.09 (NEEDLE) ×3 IMPLANT
NEEDLE HYPO 25GX1X1/2 BEV (NEEDLE) ×3 IMPLANT
NEEDLE MAYO TAPER (NEEDLE)
NS IRRIG 1000ML POUR BTL (IV SOLUTION) ×3 IMPLANT
PACK SHOULDER (CUSTOM PROCEDURE TRAY) ×3 IMPLANT
PAD ARMBOARD 7.5X6 YLW CONV (MISCELLANEOUS) ×6 IMPLANT
RESTRAINT HEAD UNIVERSAL NS (MISCELLANEOUS) ×3 IMPLANT
SCREW CENTRAL NONLOCK 25MM (Screw) ×2 IMPLANT
SCREW LOCK PERIPHERAL 36MM (Screw) ×4 IMPLANT
SET HNDPC FAN SPRY TIP SCT (DISPOSABLE) IMPLANT
SET PIN UNIVERSAL REVERSE (SET/KITS/TRAYS/PACK) ×4 IMPLANT
SLING ARM FOAM STRAP LRG (SOFTGOODS) IMPLANT
SPONGE LAP 18X18 X RAY DECT (DISPOSABLE) ×2 IMPLANT
SPONGE LAP 4X18 X RAY DECT (DISPOSABLE) ×2 IMPLANT
STEM REVERS UNIVERS SZ7 CAP (Shoulder) ×1 IMPLANT
SUCTION FRAZIER HANDLE 10FR (MISCELLANEOUS) ×2
SUCTION TUBE FRAZIER 10FR DISP (MISCELLANEOUS) ×1 IMPLANT
SUT FIBERWIRE #2 38 T-5 BLUE (SUTURE) ×6
SUT MNCRL AB 3-0 PS2 18 (SUTURE) ×3 IMPLANT
SUT MON AB 2-0 CT1 36 (SUTURE) ×3 IMPLANT
SUT VIC AB 1 CT1 27 (SUTURE) ×3
SUT VIC AB 1 CT1 27XBRD ANBCTR (SUTURE) ×1 IMPLANT
SUT VIC AB 2-0 CT1 27 (SUTURE)
SUT VIC AB 2-0 CT1 TAPERPNT 27 (SUTURE) IMPLANT
SUTURE FIBERWR #2 38 T-5 BLUE (SUTURE) ×2 IMPLANT
SYR 30ML SLIP (SYRINGE) ×3 IMPLANT
SYR CONTROL 10ML LL (SYRINGE) IMPLANT
TOWEL OR 17X24 6PK STRL BLUE (TOWEL DISPOSABLE) ×3 IMPLANT
TOWEL OR 17X26 10 PK STRL BLUE (TOWEL DISPOSABLE) ×3 IMPLANT
TOWER CARTRIDGE SMART MIX (DISPOSABLE) IMPLANT

## 2015-05-15 NOTE — Op Note (Signed)
05/15/2015  9:36 AM  PATIENT:   Frank Dodson  65 y.o. male  PRE-OPERATIVE DIAGNOSIS:  LEFT ROTATOR CUFF TEAR ARTHROPATHY   POST-OPERATIVE DIAGNOSIS:  same  PROCEDURE:  L reverse shoulder arthroplasty  SURGEON:  Sallee Hogrefe, Metta Clines M.D.  ASSISTANTS: Shuford pac   ANESTHESIA:   GET + ISB  EBL: 200  SPECIMEN:  none  Drains: none   PATIENT DISPOSITION:  PACU - hemodynamically stable.    PLAN OF CARE: Admit for overnight observation  Dictation# [id number]   Contact # [phone]

## 2015-05-15 NOTE — Anesthesia Postprocedure Evaluation (Signed)
Anesthesia Post Note  Patient: Frank Dodson  Procedure(s) Performed: Procedure(s) (LRB): LEFT REVERSE SHOULDER ARTHROPLASTY (Left)  Patient location during evaluation: PACU Anesthesia Type: General and Regional Level of consciousness: awake and alert Pain management: pain level controlled Vital Signs Assessment: post-procedure vital signs reviewed and stable Respiratory status: spontaneous breathing, nonlabored ventilation, respiratory function stable and patient connected to nasal cannula oxygen Cardiovascular status: blood pressure returned to baseline and stable Postop Assessment: no signs of nausea or vomiting Anesthetic complications: no    Last Vitals:  Filed Vitals:   05/15/15 1115 05/15/15 1125  BP: 119/86   Pulse: 70   Temp:  36.6 C  Resp: 18     Last Pain:  Filed Vitals:   05/15/15 1127  PainSc: 0-No pain                 Catalina Gravel

## 2015-05-15 NOTE — Anesthesia Procedure Notes (Addendum)
Anesthesia Regional Block:  Interscalene brachial plexus block  Pre-Anesthetic Checklist: ,, timeout performed, Correct Patient, Correct Site, Correct Laterality, Correct Procedure, Correct Position, site marked, Risks and benefits discussed,  Surgical consent,  Pre-op evaluation,  At surgeon's request and post-op pain management  Laterality: Left  Prep: chloraprep       Needles:  Injection technique: Single-shot  Needle Type: Echogenic Needle     Needle Length: 9cm 9 cm Needle Gauge: 21 and 21 G    Additional Needles:  Procedures: ultrasound guided (picture in chart) Interscalene brachial plexus block Narrative:  Start time: 05/15/2015 6:55 AM End time: 05/15/2015 7:05 AM Injection made incrementally with aspirations every 5 mL.  Performed by: Personally  Anesthesiologist: Rod Mae  Additional Notes: Patient tolerated the procedure well without complications   Procedure Name: Intubation Date/Time: 05/15/2015 7:39 AM Performed by: Rush Farmer E Pre-anesthesia Checklist: Patient identified, Emergency Drugs available, Suction available, Patient being monitored and Timeout performed Patient Re-evaluated:Patient Re-evaluated prior to inductionOxygen Delivery Method: Circle system utilized Preoxygenation: Pre-oxygenation with 100% oxygen Intubation Type: IV induction Ventilation: Mask ventilation without difficulty Laryngoscope Size: Mac and 3 Grade View: Grade III Tube type: Oral Tube size: 7.5 mm Number of attempts: 1 Airway Equipment and Method: Stylet Placement Confirmation: positive ETCO2 and breath sounds checked- equal and bilateral Secured at: 22 cm Tube secured with: Tape Dental Injury: Teeth and Oropharynx as per pre-operative assessment  Comments: Recommend using MAC 4 for future intubations.

## 2015-05-15 NOTE — H&P (Signed)
Frank Dodson    Chief Complaint: LEFT ROTATOR CUFF TEAR ARTHROPATHY  HPI: The patient is a 65 y.o. 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 Crohn's colitis ([place])     history of due to surgery  . Frequent bowel movements     06-28-13  extreme urgency with bowel movements "3-5" per day or more  . Hypertension   . GERD (gastroesophageal reflux disease)   . Arthritis     Rheumatoid , osteoarthritis  . Complication of anesthesia   . Rotator cuff tear     left - limited motion of arm  . Eczema   . History of gout   . PONV (postoperative nausea and vomiting)     "with ether"  . History of pneumonia   . History of bronchitis   . Fecal urgency   . Anemia     Past Surgical History  Procedure Laterality Date  . Cystoscopy      "clean out of uric acid deposits"  . Joint replacement      LTKA '11  . 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 Total knee arthroplasty Right 09/04/2013    Procedure: RIGHT TOTAL KNEE ARTHROPLASTY;  Surgeon: Frank Pole, MD;  Location: [facility];  Service: Orthopedics;  Laterality: Right;  . Colectomy    . Laparoscopic partial protectomy  10/2014    with J pouch  . Enterostomy closure  11/2014  . Colonoscopy    . Esophagogastroduodenoscopy      History reviewed. No pertinent family history.  Social History:  reports that he has never smoked. He has never used smokeless tobacco. He reports that he drinks alcohol. He reports that he does not use illicit drugs.   Medications Prior to Admission  Medication Sig Dispense Refill  . acetaminophen (TYLENOL) 650 MG CR tablet Take 1,300 mg by mouth every 8 (eight) hours as needed for pain.    [name] Kitchen allopurinol (ZYLOPRIM) 100 MG tablet Take 100 mg by mouth 2 (two) times daily.     [name] Kitchen doxazosin (CARDURA) 4 MG tablet Take 4 mg by mouth every evening.    [name] Kitchen esomeprazole (NEXIUM) 40 MG capsule Take 40 mg by mouth daily.      . ferrous fumarate (HEMOCYTE - 106 MG FE) 325 (106 FE) MG TABS tablet Take 1 tablet by mouth 2 (two) times daily.    . hydrOXYzine (ATARAX/VISTARIL) 25 MG tablet Take 25 mg by mouth 3 (three) times daily as needed for itching.    . loperamide (IMODIUM A-D) 2 MG tablet Take 2 mg by mouth 4 (four) times daily as needed for diarrhea or loose stools.    . magnesium gluconate (MAGONATE) 500 MG tablet Take 500 mg by mouth daily.    . Multiple Vitamin (MULTIVITAMIN WITH MINERALS) TABS tablet Take 1 tablet by mouth daily.    . predniSONE (DELTASONE) 5 MG tablet Take 5 mg by mouth daily.     . quinapril (ACCUPRIL) 40 MG tablet Take 40 mg by mouth daily.     . traMADol (ULTRAM) 50 MG tablet Take 100 mg by mouth 2 (two) times daily.    . vitamin C (ASCORBIC ACID) 500 MG tablet Take 500 mg by mouth daily.    [name] Kitchen zolpidem (AMBIEN) 10 MG tablet Take 10 mg by mouth at bedtime as needed for sleep.    [name] Kitchen  colchicine 0.6 MG tablet Take 0.6 mg by mouth every other day.    . docusate sodium 100 MG CAPS Take 100 mg by mouth 2 (two) times daily. (Patient not taking: Reported on 05/06/2015) 10 capsule 0  . HYDROcodone-acetaminophen (NORCO) 7.5-325 MG per tablet Take 1-2 tablets by mouth every 4 (four) hours as needed for moderate pain. (Patient not taking: Reported on 05/06/2015) 100 tablet 0  . mesalamine (LIALDA) 1.2 G EC tablet Take 4.8 g by mouth daily with breakfast.    . methocarbamol (ROBAXIN) 500 MG tablet Take 1 tablet (500 mg total) by mouth every 6 (six) hours as needed for muscle spasms. (Patient not taking: Reported on 05/06/2015) 50 tablet 0  . polyethylene glycol (MIRALAX / GLYCOLAX) packet Take 17 g by mouth 2 (two) times daily. (Patient not taking: Reported on 05/06/2015) 14 each 0  . saccharomyces boulardii (FLORASTOR) 250 MG capsule Take 250 mg by mouth 2 (two) times daily. 06-28-13 States needed daily for recent "flare up" with Chron's"    . vancomycin (VANCOCIN) 250 MG capsule Take 500 mg by mouth daily.  06-28-13 states "needed as prescribed for recent "falre up  With Chron's"       Physical Exam: left shoulder with painful and restricted motion as noted at recent office visits  Vitals  Temp:  [98 F (36.7 C)] 98 F (36.7 C) (05/04 0546) Pulse Rate:  [68] 68 (05/04 0546) Resp:  [20] 20 (05/04 0546) BP: (133)/(80) 133/80 mmHg (05/04 0546) SpO2:  [98 %] 98 % (05/04 0546) Weight:  [79.833 kg (176 lb)] 79.833 kg (176 lb) (05/04 0546)  Assessment/Plan  Impression: LEFT ROTATOR CUFF TEAR ARTHROPATHY   Plan of Action: Procedure(s): LEFT REVERSE SHOULDER ARTHROPLASTY  Frank Dodson M Frank Dodson 05/15/2015, 7:25 AM Contact # [phone]

## 2015-05-15 NOTE — Progress Notes (Signed)
Patient complaining of severe discomfort and fullness in his bladder. States he has had issues in the past voiding after surgery. Per Dr supples orders, [employer] guidelines followed for in and out cath. Bladder scan revealed over 250cc of urine in the bladder. In and out cath done at bedside and output was 642ml of clear yellow urine. Patient states immediate relief. Will continue to monitor for spontaneous void.

## 2015-05-15 NOTE — Op Note (Signed)
NAMEELKAN, LESSMAN NO.:  [id number]  MEDICAL RECORD NO.:  [id number]  LOCATION:  [facility]                         FACILITY:  [facility]  PHYSICIAN:  Metta Clines. Gladys Deckard, M.D.  DATE OF BIRTH:  07-Apr-1950  DATE OF PROCEDURE:  05/15/2015 DATE OF DISCHARGE:                              OPERATIVE REPORT   PREOPERATIVE DIAGNOSIS:  End-stage left shoulder rotator cuff tear arthropathy.  POSTOPERATIVE DIAGNOSIS:  End-stage left shoulder rotator cuff tear arthropathy.  PROCEDURE:  Left reverse shoulder arthroplasty utilizing a press-fit size 7 Arthrex stem, +3 polyethylene insert, a large glenosphere and a small baseplate.  SURGEON:  Metta Clines. Milind Raether, M.D.  Terrence DupontOlivia Mackie A. Shuford, P.A.-C.  ANESTHESIA:  General endotracheal as well as an interscalene block.  ESTIMATED BLOOD LOSS:  200 mL.  DRAINS:  None.  HISTORY:  Mr. Gianino is a 65 year old gentleman who has had chronic and progressively increasing bilateral shoulder pain, weakness and functional limitations related to end-stage rotator cuff tear arthropathies.  The left shoulder is more problematic and symptomatic. He was brought to the operating room at this time for planned left reverse shoulder arthroplasty as described below.  Preoperatively, I counseled Mr. Zakowski regarding treatment options and potential risks versus benefits thereof.  Possible surgical complications were reviewed including bleeding, infection, neurovascular injury, persistent pain, loss of motion, anesthetic complication, failure of the implant, and possible need for additional surgery.  He understands and accepts and agrees with our planned procedure.  PROCEDURE IN DETAIL:  After undergoing routine preop evaluation, the patient received prophylactic antibiotics and an interscalene block was established in the holding area by the Anesthesia Department.  Placed supine on the operating table, underwent smooth induction of a  general endotracheal anesthesia.  Placed in a beach-chair position and appropriately padded and protected.  The left shoulder girdle region was sterilely prepped and draped in standard fashion.  Time-out was called. An anterior deltopectoral approach of the left shoulder was made through an 8-cm incision.  Skin flaps were elevated.  Dissection carried deeply. The cephalic vein was retracted laterally the deltoid.  The deltopectoral interval was then developed proximal to distal.  The upper centimeter of the pec major was then tenotomized to enhance exposure. We then divided the adhesions from beneath the deltoid.  I then mobilized the conjoined tendon, this was retracted medially.  The humeral head was completely denuded of rotator cuff over superior half. The remnant of the subscap was divided away from the lesser tuberosity and tagged for later repair.  We divided the capsular tissues from the anteroinferior and inferior aspects of the metaphysis at the proximal humerus allowing [place] to deliver the humeral head through our incision. Using our extramedullary guide, we outlined our proposed humeral head resection and this was then performed approximately 15-20 degrees of retroversion.  We then protected the cut proximal surface of the humeral metaphysis with a metal plate and then exposed the glenoid using combination of Fukuda, snake tongue and pitchfork retractors.  Remnant of the labrum was excised circumferentially, gained circumferential exposure of the glenoid and mobilized the subscapularis as well.  Once we had appropriate exposure, a guidepin was then  placed into the center of the glenoid.  We then reamed the glenoid with a central reamer followed by the peripheral reamer and this gave [place] an appropriate stepcut for the placement of our baseplate.  The baseplate was then impacted.  We placed our central lag screw 25 mm in length with excellent fit and fixation.  The superior and  inferior locking screws were then placed to appropriate depth with excellent fit and fixation. We then placed a large 42 glenosphere, impacted this onto the baseplate with excellent fit and fixation.  At this point, we returned our attention to the proximal humerus where we hand-reamed the canal and then broached up to size 7, which developed excellent fit and fixation. We used the metaphyseal reamer.  We performed an initial trial reduction, which showed excessive soft tissue tension, so we re-broached seating the size 7 slightly deeper, then we repaired the proximal humeral metaphyseal region with a reamer and then the trial reduction showed excellent fit and soft tissue balance.  At this point, we assembled our final size 7 stem with the appropriate baseplate with the posterior offset.  This was impacted into position with excellent fit and fixation.  We then applied the +3 polyethylene insert for a trial reduction and it showed again good soft tissue balance, good shoulder motion, and good stability.  We then removed the trial, we placed our final +3 polyethylene insert.  We performed our final reduction.  Again, we were pleased with the overall mobility and soft tissue balance and good stability of the shoulder.  The wound was copiously irrigated.  The joint was irrigated, hemostasis was obtained.  The subscapularis was then repaired back to the lesser tuberosity with the #2 FiberWires, which we placed through bone tunnels and lesser tuberosity region.  Once this was completed, we easily achieve 20 degrees of external rotation. The deltopectoral interval was then reapproximated with series of figure- of-eight #1 Vicryl sutures.  The 2-0 Vicryl used for the subcu layer, intracuticular 3-0 Monocryl for the skin followed by Dermabond and Aquacel dressing.  Left arm was then placed into a sling.  The patient was awakened, extubated, and taken to the recovery room in  stable condition.  Tracy A. Shuford, PA-C, was used as an Environmental consultant throughout this case, essential for help with positioning the patient, positioning the extremity, management of the retractors, tissue manipulation, implantation of the prosthesis, wound closure and intraoperative decision making.     Metta Clines. Kaysie Michelini, M.D.     KMS/MEDQ  D:  05/15/2015  T:  05/15/2015  Job:  [id number]

## 2015-05-15 NOTE — Transfer of Care (Signed)
Immediate Anesthesia Transfer of Care Note  Patient: Frank Dodson  Procedure(s) Performed: Procedure(s): LEFT REVERSE SHOULDER ARTHROPLASTY (Left)  Patient Location: PACU  Anesthesia Type:GA combined with regional for post-op pain  Level of Consciousness: awake, alert  and oriented  Airway & Oxygen Therapy: Patient Spontanous Breathing and Patient connected to nasal cannula oxygen  Post-op Assessment: Report given to RN, Post -op Vital signs reviewed and stable and Patient moving all extremities  Post vital signs: Reviewed and stable  Last Vitals:  Filed Vitals:   05/15/15 0546  BP: 133/80  Pulse: 68  Temp: 36.7 C  Resp: 20    Last Pain: There were no vitals filed for this visit.       Complications: No apparent anesthesia complications

## 2015-05-16 ENCOUNTER — Encounter (HOSPITAL_COMMUNITY): Payer: Self-pay | Admitting: Orthopedic Surgery

## 2015-05-16 DIAGNOSIS — M75122 Complete rotator cuff tear or rupture of left shoulder, not specified as traumatic: Secondary | ICD-10-CM | POA: Diagnosis not present

## 2015-05-16 LAB — HEMOGLOBIN AND HEMATOCRIT, BLOOD
HEMATOCRIT: 31.7 % — AB (ref 39.0–52.0)
Hemoglobin: 10.2 g/dL — ABNORMAL LOW (ref 13.0–17.0)

## 2015-05-16 MED ORDER — ONDANSETRON HCL 4 MG PO TABS: 4.0000 mg | ORAL_TABLET | Freq: Three times a day (TID) | ORAL | Status: DC | PRN

## 2015-05-16 MED ORDER — OXYCODONE-ACETAMINOPHEN 5-325 MG PO TABS: 1.0000 | ORAL_TABLET | ORAL | Status: DC | PRN

## 2015-05-16 MED ORDER — DIAZEPAM 5 MG PO TABS: 2.5000 mg | ORAL_TABLET | Freq: Four times a day (QID) | ORAL | Status: DC | PRN

## 2015-05-16 NOTE — Progress Notes (Signed)
Reviewed discharge papers and medications with understanding

## 2015-05-16 NOTE — Discharge Instructions (Signed)

## 2015-05-16 NOTE — Discharge Summary (Signed)
PATIENT ID:      Frank Dodson  MRN:     [id number] DOB/AGE:    [birth year] / 65 y.o.     DISCHARGE SUMMARY  ADMISSION DATE:    05/15/2015 DISCHARGE DATE:    ADMISSION DIAGNOSIS: LEFT ROTATOR CUFF TEAR ARTHROPATHY  Past Medical History  Diagnosis Date  . Crohn's colitis ([place])     history of due to surgery  . Frequent bowel movements     06-28-13  extreme urgency with bowel movements "3-5" per day or more  . Hypertension   . GERD (gastroesophageal reflux disease)   . Rotator cuff tear     left - limited motion of arm  . Eczema   . History of gout   . Fecal urgency   . PONV (postoperative nausea and vomiting)     "with ether"  . Pneumonia ~ 2007  . Iron deficiency anemia   . Rheumatoid arthritis ([place])   . Osteoarthritis   . Decreased range of motion (ROM) of shoulder     "both shoulders; never had a rotator cuff tear; at some point in my life I've sheared both muscles off"    DISCHARGE DIAGNOSIS:   Active Problems:   S/p reverse total shoulder arthroplasty   PROCEDURE: Procedure(s): LEFT REVERSE SHOULDER ARTHROPLASTY on 05/15/2015  CONSULTS:     HISTORY:  See H&P in chart.  HOSPITAL COURSE:  RAYMON GATEWOOD is a 65 y.o. admitted on 05/15/2015 with a diagnosis of LEFT ROTATOR CUFF TEAR ARTHROPATHY .  They were brought to the operating room on 05/15/2015 and underwent Procedure(s): LEFT REVERSE SHOULDER ARTHROPLASTY.    They were given perioperative antibiotics: Anti-infectives    Start     Dose/Rate Route Frequency Ordered Stop   05/15/15 1300  ceFAZolin (ANCEF) IVPB 2g/100 mL premix     2 g 200 mL/hr over 30 Minutes Intravenous Every 6 hours 05/15/15 1146 05/16/15 0216   05/15/15 0538  ceFAZolin (ANCEF) IVPB 2g/100 mL premix     2 g 200 mL/hr over 30 Minutes Intravenous On call to O.R. 05/15/15 [id number] 05/15/15 0753    .  Patient underwent the above named procedure and tolerated it well. The following day they were hemodynamically stable and pain was controlled on oral analgesics.  They were neurovascularly intact to the operative extremity. OT was ordered and worked with patient per protocol. They were medically and orthopaedically stable for discharge on day 1    DIAGNOSTIC STUDIES:  RECENT RADIOGRAPHIC STUDIES :  No results found.  RECENT VITAL SIGNS:  Patient Vitals for the past 24 hrs:  BP Temp Temp src Pulse Resp SpO2  05/16/15 0500 126/72 mmHg 97.6 F (36.4 C) Axillary 83 18 98 %  05/15/15 2029 130/75 mmHg 97.9 F (36.6 C) Axillary 84 18 97 %  05/15/15 1745 119/79 mmHg - - - - -  05/15/15 1154 119/79 mmHg 97.8 F (36.6 C) - 66 18 99 %  05/15/15 1125 - 97.9 F (36.6 C) - - - -  05/15/15 1115 119/86 mmHg - - 70 18 97 %  05/15/15 1100 (!) 126/97 mmHg - - 71 17 97 %  05/15/15 1045 118/80 mmHg - - 80 18 97 %  05/15/15 1030 (!) 127/115 mmHg - - 73 16 96 %  05/15/15 1015 124/82 mmHg - - 74 17 100 %  05/15/15 1005 96/72 mmHg - - 84 20 100 %  05/15/15 0946 121/83 mmHg - - 84 16 100 %  05/15/15 0931 120/76  mmHg 97.7 F (36.5 C) - 85 (!) 8 100 %  .  RECENT EKG RESULTS:    Orders placed or performed during the hospital encounter of 05/07/15  . EKG 12 lead  . EKG 12 lead  . EKG 12-Lead  . EKG 12-Lead    DISCHARGE INSTRUCTIONS:  Discharge Instructions    Discontinue IV    Complete by:  As directed            DISCHARGE MEDICATIONS:     Medication List    STOP taking these medications        HYDROcodone-acetaminophen 7.5-325 MG tablet  Commonly known as:  NORCO      TAKE these medications        acetaminophen 650 MG CR tablet  Commonly known as:  TYLENOL  Take 1,300 mg by mouth every 8 (eight) hours as needed for pain.     allopurinol 100 MG tablet  Commonly known as:  ZYLOPRIM  Take 100 mg by mouth 2 (two) times daily.     colchicine 0.6 MG tablet  Take 0.6 mg by mouth every other day.     diazepam 5 MG tablet  Commonly known as:  VALIUM  Take 0.5-1 tablets (2.5-5 mg total) by mouth every 6 (six) hours as needed for muscle spasms  or sedation.     doxazosin 4 MG tablet  Commonly known as:  CARDURA  Take 4 mg by mouth every evening.     DSS 100 MG Caps  Take 100 mg by mouth 2 (two) times daily.     esomeprazole 40 MG capsule  Commonly known as:  NEXIUM  Take 40 mg by mouth daily.     ferrous fumarate 325 (106 Fe) MG Tabs tablet  Commonly known as:  HEMOCYTE - 106 mg FE  Take 1 tablet by mouth 2 (two) times daily.     hydrOXYzine 25 MG tablet  Commonly known as:  ATARAX/VISTARIL  Take 25 mg by mouth 3 (three) times daily as needed for itching.     IMODIUM A-D 2 MG tablet  Generic drug:  loperamide  Take 2 mg by mouth 4 (four) times daily as needed for diarrhea or loose stools.     magnesium gluconate 500 MG tablet  Commonly known as:  MAGONATE  Take 500 mg by mouth daily.     mesalamine 1.2 g EC tablet  Commonly known as:  LIALDA  Take 4.8 g by mouth daily with breakfast.     methocarbamol 500 MG tablet  Commonly known as:  ROBAXIN  Take 1 tablet (500 mg total) by mouth every 6 (six) hours as needed for muscle spasms.     multivitamin with minerals Tabs tablet  Take 1 tablet by mouth daily.     ondansetron 4 MG tablet  Commonly known as:  ZOFRAN  Take 1 tablet (4 mg total) by mouth every 8 (eight) hours as needed for nausea or vomiting.     oxyCODONE-acetaminophen 5-325 MG tablet  Commonly known as:  PERCOCET  Take 1-2 tablets by mouth every 4 (four) hours as needed.     polyethylene glycol packet  Commonly known as:  MIRALAX / GLYCOLAX  Take 17 g by mouth 2 (two) times daily.     predniSONE 5 MG tablet  Commonly known as:  DELTASONE  Take 5 mg by mouth daily.     quinapril 40 MG tablet  Commonly known as:  ACCUPRIL  Take 40 mg by mouth daily.  saccharomyces boulardii 250 MG capsule  Commonly known as:  FLORASTOR  Take 250 mg by mouth 2 (two) times daily. 06-28-13 States needed daily for recent "flare up" with Chron's"     traMADol 50 MG tablet  Commonly known as:  ULTRAM  Take  100 mg by mouth 2 (two) times daily.     vancomycin 250 MG capsule  Commonly known as:  VANCOCIN  Take 500 mg by mouth daily. 06-28-13 states "needed as prescribed for recent "falre up  With Chron's"     vitamin C 500 MG tablet  Commonly known as:  ASCORBIC ACID  Take 500 mg by mouth daily.     zolpidem 10 MG tablet  Commonly known as:  AMBIEN  Take 10 mg by mouth at bedtime as needed for sleep.        FOLLOW UP VISIT:       Follow-up Information    Follow up with Metta Clines SUPPLE, MD.   Specialty:  Orthopedic Surgery   Why:  call to be seen in 10-14 days   Contact information:   [address] [place] [address] [id number]       DISCHARGE TO: Home   DISPOSITION: Good  DISCHARGE CONDITION:  Festus Barren for Dr. Justice Britain 05/16/2015, 8:04 AM

## 2015-05-16 NOTE — Evaluation (Signed)
Occupational Therapy Evaluation Patient Details Name: Frank Dodson MRN: [id number] DOB: [birth year] Today's Date: 05/16/2015    History of Present Illness 65 yo male s/p LEFT REVERSE SHOULDER ARTHROPLASTY (Left)   Clinical Impression   Patient evaluated by Occupational Therapy with no further acute OT needs identified. All education has been completed and the patient has no further questions. See below for any follow-up Occupational Therapy or equipment needs. OT to sign off. Thank you for referral.  Pt to follow up with MD in two weeks and MD to make further recommendations for shoulder follow up. Pt currently able to complete adls with wife (A) at acceptable level for d/c home.     Follow Up Recommendations  No OT follow up    Equipment Recommendations  None recommended by OT    Recommendations for Other Services       Precautions / Restrictions Precautions Precautions: Shoulder Type of Shoulder Precautions: dr supple ff 90 degrees abduction 60 degrees no external rotation Shoulder Interventions: Shoulder sling/immobilizer;Off for dressing/bathing/exercises;At all times Precaution Comments: handout provided for shoulder care Required Braces or Orthoses: Sling Restrictions LUE Weight Bearing: Non weight bearing      Mobility Bed Mobility Overal bed mobility: Modified Independent                Transfers Overall transfer level: Modified independent                    Balance                                            ADL Overall ADL's : Needs assistance/impaired                 Upper Body Dressing : Moderate assistance Upper Body Dressing Details (indicate cue type and reason): wife able to don sling MOD I Lower Body Dressing: Minimal assistance Lower Body Dressing Details (indicate cue type and reason): (A) to pull pants up completely. Educated on elastic waist vs button with zipper bottom options Toilet Transfer: Min  guard             General ADL Comments: Pt with dizziness with static standing and return to supine without symptoms. Pt reports no symptoms supine. Pt with all education complete and good return demo     Vision     Perception     Praxis      Pertinent Vitals/Pain Pain Assessment: Faces Faces Pain Scale: Hurts little more Pain Location: shoulder at surgical site Pain Descriptors / Indicators: Operative site guarding Pain Intervention(s): Monitored during session;Premedicated before session;Repositioned     Hand Dominance Right   Extremity/Trunk Assessment Upper Extremity Assessment Upper Extremity Assessment: LUE deficits/detail LUE Deficits / Details: shoulder procotol   Lower Extremity Assessment Lower Extremity Assessment: Overall WFL for tasks assessed   Cervical / Trunk Assessment Cervical / Trunk Assessment: Normal   Communication Communication Communication: No difficulties   Cognition Arousal/Alertness: Awake/alert Behavior During Therapy: WFL for tasks assessed/performed Overall Cognitive Status: Within Functional Limits for tasks assessed                     General Comments       Exercises Exercises: Shoulder     Shoulder Instructions Shoulder Instructions Donning/doffing shirt without moving shoulder: Caregiver independent with task Method for sponge bathing under operated UE: Caregiver independent  with task Donning/doffing sling/immobilizer: Caregiver independent with task Correct positioning of sling/immobilizer: Caregiver independent with task ROM for elbow, wrist and digits of operated UE: Graham expects to be discharged to:: Private residence Living Arrangements: Spouse/significant other Available Help at Discharge: Family Type of Home: [place]: One level               Home Equipment: Kasandra Knudsen - single point   Additional Comments: pt educated no bath tubs no hot tubs no  swimming pools. Pt asked directly about hot tubs      Prior Functioning/Environment Level of Independence: Independent             OT Diagnosis: Generalized weakness;Acute pain   OT Problem List:     OT Treatment/Interventions:      OT Goals(Current goals can be found in the care plan section)    OT Frequency:     Barriers to D/C:            Co-evaluation              End of Session Nurse Communication: Mobility status;Precautions  Activity Tolerance: Patient tolerated treatment well Patient left: in bed;with call bell/phone within reach;with family/visitor present   Time: [id number] OT Time Calculation (min): 35 min Charges:  OT General Charges $OT Visit: 1 Procedure OT Evaluation $OT Eval Moderate Complexity: 1 Procedure OT Treatments $Self Care/Home Management : 8-22 mins G-Codes: OT G-codes **NOT FOR INPATIENT CLASS** Functional Assessment Tool Used: clinical judgement Functional Limitation: Self care Self Care Current Status [id number]): At least 20 percent but less than 40 percent impaired, limited or restricted Self Care Goal Status [id number]): At least 20 percent but less than 40 percent impaired, limited or restricted Self Care Discharge Status [phone]): At least 20 percent but less than 40 percent impaired, limited or restricted  Parke Poisson B 05/16/2015, 12:42 PM   Jeri Modena   OTR/L Pager: [phone] Office: [phone] .

## 2015-05-27 ENCOUNTER — Encounter (HOSPITAL_COMMUNITY): Payer: Self-pay | Admitting: Orthopedic Surgery

## 2015-08-11 ENCOUNTER — Inpatient Hospital Stay (HOSPITAL_COMMUNITY)
Admission: EM | Admit: 2015-08-11 | Discharge: 2015-08-15 | DRG: 167 | Disposition: A | Discharge status: Home / Self Care | Payer: BLUE CROSS/BLUE SHIELD | Attending: Internal Medicine | Admitting: Internal Medicine

## 2015-08-11 ENCOUNTER — Encounter (HOSPITAL_COMMUNITY): Payer: Self-pay | Admitting: Emergency Medicine

## 2015-08-11 ENCOUNTER — Emergency Department (HOSPITAL_COMMUNITY): Payer: BLUE CROSS/BLUE SHIELD

## 2015-08-11 DIAGNOSIS — I82811 Embolism and thrombosis of superficial veins of right lower extremities: Secondary | ICD-10-CM | POA: Diagnosis present

## 2015-08-11 DIAGNOSIS — Z96653 Presence of artificial knee joint, bilateral: Secondary | ICD-10-CM | POA: Diagnosis present

## 2015-08-11 DIAGNOSIS — D509 Iron deficiency anemia, unspecified: Secondary | ICD-10-CM | POA: Diagnosis present

## 2015-08-11 DIAGNOSIS — E872 Acidosis, unspecified: Secondary | ICD-10-CM

## 2015-08-11 DIAGNOSIS — I82492 Acute embolism and thrombosis of other specified deep vein of left lower extremity: Secondary | ICD-10-CM | POA: Diagnosis present

## 2015-08-11 DIAGNOSIS — K5010 Crohn's disease of large intestine without complications: Secondary | ICD-10-CM | POA: Diagnosis present

## 2015-08-11 DIAGNOSIS — M069 Rheumatoid arthritis, unspecified: Secondary | ICD-10-CM | POA: Diagnosis present

## 2015-08-11 DIAGNOSIS — I2609 Other pulmonary embolism with acute cor pulmonale: Secondary | ICD-10-CM | POA: Diagnosis not present

## 2015-08-11 DIAGNOSIS — K920 Hematemesis: Secondary | ICD-10-CM | POA: Diagnosis not present

## 2015-08-11 DIAGNOSIS — K449 Diaphragmatic hernia without obstruction or gangrene: Secondary | ICD-10-CM | POA: Diagnosis present

## 2015-08-11 DIAGNOSIS — Z9049 Acquired absence of other specified parts of digestive tract: Secondary | ICD-10-CM | POA: Diagnosis not present

## 2015-08-11 DIAGNOSIS — A047 Enterocolitis due to Clostridium difficile: Secondary | ICD-10-CM | POA: Diagnosis present

## 2015-08-11 DIAGNOSIS — Z7952 Long term (current) use of systemic steroids: Secondary | ICD-10-CM | POA: Diagnosis not present

## 2015-08-11 DIAGNOSIS — K210 Gastro-esophageal reflux disease with esophagitis: Secondary | ICD-10-CM | POA: Diagnosis present

## 2015-08-11 DIAGNOSIS — M109 Gout, unspecified: Secondary | ICD-10-CM | POA: Diagnosis present

## 2015-08-11 DIAGNOSIS — I5032 Chronic diastolic (congestive) heart failure: Secondary | ICD-10-CM | POA: Diagnosis present

## 2015-08-11 DIAGNOSIS — Z96612 Presence of left artificial shoulder joint: Secondary | ICD-10-CM | POA: Diagnosis present

## 2015-08-11 DIAGNOSIS — I110 Hypertensive heart disease with heart failure: Secondary | ICD-10-CM | POA: Diagnosis present

## 2015-08-11 DIAGNOSIS — M1990 Unspecified osteoarthritis, unspecified site: Secondary | ICD-10-CM | POA: Diagnosis present

## 2015-08-11 DIAGNOSIS — R0789 Other chest pain: Secondary | ICD-10-CM | POA: Diagnosis present

## 2015-08-11 DIAGNOSIS — K921 Melena: Secondary | ICD-10-CM

## 2015-08-11 DIAGNOSIS — D72829 Elevated white blood cell count, unspecified: Secondary | ICD-10-CM

## 2015-08-11 DIAGNOSIS — I2699 Other pulmonary embolism without acute cor pulmonale: Secondary | ICD-10-CM | POA: Diagnosis not present

## 2015-08-11 DIAGNOSIS — R0600 Dyspnea, unspecified: Secondary | ICD-10-CM | POA: Diagnosis not present

## 2015-08-11 DIAGNOSIS — D62 Acute posthemorrhagic anemia: Secondary | ICD-10-CM | POA: Diagnosis present

## 2015-08-11 LAB — I-STAT TROPONIN, ED: TROPONIN I, POC: 0 ng/mL (ref 0.00–0.08)

## 2015-08-11 LAB — CBC
HCT: 35.8 % — ABNORMAL LOW (ref 39.0–52.0)
Hemoglobin: 11.7 g/dL — ABNORMAL LOW (ref 13.0–17.0)
MCH: 31.3 pg (ref 26.0–34.0)
MCHC: 32.7 g/dL (ref 30.0–36.0)
MCV: 95.7 fL (ref 78.0–100.0)
PLATELETS: 272 10*3/uL (ref 150–400)
RBC: 3.74 MIL/uL — ABNORMAL LOW (ref 4.22–5.81)
RDW: 14.3 % (ref 11.5–15.5)
WBC: 10.8 10*3/uL — ABNORMAL HIGH (ref 4.0–10.5)

## 2015-08-11 LAB — DIFFERENTIAL
BASOS ABS: 0 10*3/uL (ref 0.0–0.1)
Basophils Relative: 0 %
EOS ABS: 0.1 10*3/uL (ref 0.0–0.7)
EOS PCT: 1 %
Lymphocytes Relative: 29 %
Lymphs Abs: 2.9 10*3/uL (ref 0.7–4.0)
Monocytes Absolute: 1.3 10*3/uL — ABNORMAL HIGH (ref 0.1–1.0)
Monocytes Relative: 13 %
NEUTROS PCT: 57 %
Neutro Abs: 5.9 10*3/uL (ref 1.7–7.7)

## 2015-08-11 LAB — BASIC METABOLIC PANEL
Anion gap: 11 (ref 5–15)
BUN: 21 mg/dL — AB (ref 6–20)
CALCIUM: 9.2 mg/dL (ref 8.9–10.3)
CO2: 25 mmol/L (ref 22–32)
CREATININE: 1.31 mg/dL — AB (ref 0.61–1.24)
Chloride: 104 mmol/L (ref 101–111)
GFR calc non Af Amer: 56 mL/min — ABNORMAL LOW (ref >60)
GLUCOSE: 138 mg/dL — AB (ref 65–99)
Potassium: 3.8 mmol/L (ref 3.5–5.1)
Sodium: 140 mmol/L (ref 135–145)

## 2015-08-11 LAB — HEPATIC FUNCTION PANEL
ALBUMIN: 4.5 g/dL (ref 3.5–5.0)
ALT: 27 U/L (ref 17–63)
AST: 29 U/L (ref 15–41)
Alkaline Phosphatase: 68 U/L (ref 38–126)
Bilirubin, Direct: <0.1 mg/dL — ABNORMAL LOW (ref 0.1–0.5)
TOTAL PROTEIN: 8.3 g/dL — AB (ref 6.5–8.1)
Total Bilirubin: 0.6 mg/dL (ref 0.3–1.2)

## 2015-08-11 LAB — I-STAT CG4 LACTIC ACID, ED: Lactic Acid, Venous: 2.9 mmol/L (ref 0.5–1.9)

## 2015-08-11 LAB — FIBRINOGEN: FIBRINOGEN: 654 mg/dL — AB (ref 210–475)

## 2015-08-11 LAB — APTT: aPTT: 177 seconds (ref 24–36)

## 2015-08-11 LAB — LIPASE, BLOOD: LIPASE: 43 U/L (ref 11–51)

## 2015-08-11 LAB — PLATELET COUNT: PLATELETS: 240 10*3/uL (ref 150–400)

## 2015-08-11 LAB — D-DIMER, QUANTITATIVE (NOT AT ARMC): D DIMER QUANT: 5.56 ug{FEU}/mL — AB (ref 0.00–0.50)

## 2015-08-11 LAB — I-STAT CREATININE, ED: CREATININE: 1.2 mg/dL (ref 0.61–1.24)

## 2015-08-11 LAB — SAVE SMEAR

## 2015-08-11 MED ORDER — MESALAMINE 1.2 G PO TBEC: 4.8000 g | DELAYED_RELEASE_TABLET | Freq: Every day | ORAL | Status: DC

## 2015-08-11 MED ORDER — ACETAMINOPHEN 500 MG PO TABS: 1000.0000 mg | ORAL_TABLET | Freq: Three times a day (TID) | ORAL | Status: DC | PRN

## 2015-08-11 MED ORDER — SODIUM CHLORIDE 0.9 % IV SOLN
INTRAVENOUS | Status: AC
  Administered 2015-08-11 – 2015-08-12 (×2): via INTRAVENOUS

## 2015-08-11 MED ORDER — HEPARIN (PORCINE) IN NACL 100-0.45 UNIT/ML-% IJ SOLN
1750.0000 [IU]/h | INTRAMUSCULAR | Status: DC
  Administered 2015-08-11 – 2015-08-13 (×3): 1400 [IU]/h via INTRAVENOUS
  Filled 2015-08-11 (×3): qty 250

## 2015-08-11 MED ORDER — SODIUM CHLORIDE 0.9% FLUSH: 9.0000 mL | INTRAVENOUS | Status: DC | PRN

## 2015-08-11 MED ORDER — LOPERAMIDE HCL 2 MG PO CAPS
2.0000 mg | ORAL_CAPSULE | Freq: Four times a day (QID) | ORAL | Status: DC | PRN
  Administered 2015-08-12 – 2015-08-14 (×5): 2 mg via ORAL
  Filled 2015-08-11 (×5): qty 1

## 2015-08-11 MED ORDER — HEPARIN BOLUS VIA INFUSION
5000.0000 [IU] | Freq: Once | INTRAVENOUS | Status: AC
  Administered 2015-08-11: 5000 [IU] via INTRAVENOUS
  Filled 2015-08-11: qty 5000

## 2015-08-11 MED ORDER — DIPHENHYDRAMINE HCL 12.5 MG/5ML PO ELIX: 12.5000 mg | ORAL_SOLUTION | Freq: Four times a day (QID) | ORAL | Status: DC | PRN

## 2015-08-11 MED ORDER — LABETALOL HCL 5 MG/ML IV SOLN
10.0000 mg | Freq: Once | INTRAVENOUS | Status: AC
Start: 2015-08-11 — End: 2015-08-11
  Administered 2015-08-11: 10 mg via INTRAVENOUS
  Filled 2015-08-11: qty 4

## 2015-08-11 MED ORDER — HYDROMORPHONE 1 MG/ML IV SOLN
INTRAVENOUS | Status: DC
  Administered 2015-08-11: 0.3 mg via INTRAVENOUS
  Administered 2015-08-12: 3.6 mg via INTRAVENOUS
  Administered 2015-08-12: 3.2 mg via INTRAVENOUS
  Administered 2015-08-12: 1.6 mg via INTRAVENOUS
  Administered 2015-08-12: 3.2 mg via INTRAVENOUS
  Administered 2015-08-12: 3.4 mg via INTRAVENOUS
  Administered 2015-08-12: 1.8 mg via INTRAVENOUS
  Administered 2015-08-12: 3.1 mg via INTRAVENOUS
  Administered 2015-08-13: 0.6 mg via INTRAVENOUS
  Filled 2015-08-11: qty 25

## 2015-08-11 MED ORDER — DOXAZOSIN MESYLATE 4 MG PO TABS
4.0000 mg | ORAL_TABLET | Freq: Every evening | ORAL | Status: DC
  Administered 2015-08-11 – 2015-08-12 (×2): 4 mg via ORAL
  Filled 2015-08-11 (×2): qty 1

## 2015-08-11 MED ORDER — HYDROMORPHONE HCL 1 MG/ML IJ SOLN
1.0000 mg | Freq: Once | INTRAMUSCULAR | Status: AC
  Administered 2015-08-11: 1 mg via INTRAVENOUS
  Filled 2015-08-11: qty 1

## 2015-08-11 MED ORDER — ALLOPURINOL 100 MG PO TABS
100.0000 mg | ORAL_TABLET | Freq: Two times a day (BID) | ORAL | Status: DC
Start: 2015-08-11 — End: 2015-08-13
  Administered 2015-08-11 – 2015-08-12 (×3): 100 mg via ORAL
  Filled 2015-08-11 (×3): qty 1

## 2015-08-11 MED ORDER — ENOXAPARIN SODIUM 40 MG/0.4ML ~~LOC~~ SOLN: 40.0000 mg | Freq: Once | SUBCUTANEOUS | Status: DC

## 2015-08-11 MED ORDER — VANCOMYCIN HCL 125 MG PO CAPS: 500.0000 mg | ORAL_CAPSULE | Freq: Every day | ORAL | Status: DC

## 2015-08-11 MED ORDER — SODIUM CHLORIDE 0.9 % IV BOLUS (SEPSIS): 1000.0000 mL | Freq: Once | INTRAVENOUS | Status: DC

## 2015-08-11 MED ORDER — LOPERAMIDE HCL 2 MG PO CAPS: 4.0000 mg | ORAL_CAPSULE | ORAL | Status: DC | PRN

## 2015-08-11 MED ORDER — ACETAMINOPHEN ER 650 MG PO TBCR
1300.0000 mg | EXTENDED_RELEASE_TABLET | Freq: Three times a day (TID) | ORAL | Status: DC | PRN
  Filled 2015-08-11: qty 2

## 2015-08-11 MED ORDER — ADULT MULTIVITAMIN W/MINERALS CH
1.0000 | ORAL_TABLET | Freq: Every day | ORAL | Status: DC
  Administered 2015-08-12 – 2015-08-15 (×2): 1 via ORAL
  Filled 2015-08-11 (×2): qty 1

## 2015-08-11 MED ORDER — SODIUM CHLORIDE 0.9% FLUSH
3.0000 mL | Freq: Two times a day (BID) | INTRAVENOUS | Status: DC
Start: 2015-08-11 — End: 2015-08-15
  Administered 2015-08-11 – 2015-08-13 (×3): 3 mL via INTRAVENOUS

## 2015-08-11 MED ORDER — FERROUS FUMARATE 324 (106 FE) MG PO TABS
1.0000 | ORAL_TABLET | Freq: Two times a day (BID) | ORAL | Status: DC
  Administered 2015-08-11 – 2015-08-15 (×6): 106 mg via ORAL
  Filled 2015-08-11 (×9): qty 1

## 2015-08-11 MED ORDER — ONDANSETRON HCL 4 MG/2ML IJ SOLN
4.0000 mg | Freq: Four times a day (QID) | INTRAMUSCULAR | Status: DC | PRN
  Administered 2015-08-12 – 2015-08-13 (×2): 4 mg via INTRAVENOUS
  Filled 2015-08-11 (×2): qty 2

## 2015-08-11 MED ORDER — MORPHINE SULFATE (PF) 4 MG/ML IV SOLN
4.0000 mg | Freq: Once | INTRAVENOUS | Status: AC
  Administered 2015-08-11: 4 mg via INTRAVENOUS
  Filled 2015-08-11: qty 1

## 2015-08-11 MED ORDER — NALOXONE HCL 0.4 MG/ML IJ SOLN: 0.4000 mg | INTRAMUSCULAR | Status: DC | PRN

## 2015-08-11 MED ORDER — VITAMIN C 500 MG PO TABS
500.0000 mg | ORAL_TABLET | Freq: Every day | ORAL | Status: DC
  Administered 2015-08-12: 500 mg via ORAL
  Filled 2015-08-11: qty 1

## 2015-08-11 MED ORDER — NICARDIPINE HCL IN NACL 20-0.86 MG/200ML-% IV SOLN
3.0000 mg/h | Freq: Once | INTRAVENOUS | Status: DC
  Filled 2015-08-11: qty 200

## 2015-08-11 MED ORDER — DIPHENHYDRAMINE HCL 50 MG/ML IJ SOLN: 12.5000 mg | Freq: Four times a day (QID) | INTRAMUSCULAR | Status: DC | PRN

## 2015-08-11 MED ORDER — HYDROXYZINE HCL 25 MG PO TABS: 25.0000 mg | ORAL_TABLET | Freq: Three times a day (TID) | ORAL | Status: DC | PRN

## 2015-08-11 MED ORDER — ONDANSETRON HCL 4 MG PO TABS: 4.0000 mg | ORAL_TABLET | Freq: Three times a day (TID) | ORAL | Status: DC | PRN

## 2015-08-11 MED ORDER — IOPAMIDOL (ISOVUE-370) INJECTION 76%
100.0000 mL | Freq: Once | INTRAVENOUS | Status: AC | PRN
  Administered 2015-08-11: 100 mL via INTRAVENOUS

## 2015-08-11 MED ORDER — PANTOPRAZOLE SODIUM 40 MG PO TBEC: 40.0000 mg | DELAYED_RELEASE_TABLET | Freq: Every day | ORAL | Status: DC

## 2015-08-11 MED ORDER — COLCHICINE 0.6 MG PO TABS: 0.6000 mg | ORAL_TABLET | ORAL | Status: DC

## 2015-08-11 MED ORDER — HYDROMORPHONE HCL 1 MG/ML IJ SOLN: 1.0000 mg | INTRAMUSCULAR | Status: DC

## 2015-08-11 MED ORDER — SACCHAROMYCES BOULARDII 250 MG PO CAPS: 250.0000 mg | ORAL_CAPSULE | Freq: Two times a day (BID) | ORAL | Status: DC

## 2015-08-11 MED ORDER — MAGNESIUM GLUCONATE 500 MG PO TABS
500.0000 mg | ORAL_TABLET | Freq: Every day | ORAL | Status: DC
  Administered 2015-08-12: 500 mg via ORAL
  Filled 2015-08-11 (×2): qty 1

## 2015-08-11 MED ORDER — PREDNISONE 5 MG PO TABS
5.0000 mg | ORAL_TABLET | Freq: Every day | ORAL | Status: DC
  Administered 2015-08-12 – 2015-08-15 (×2): 5 mg via ORAL
  Filled 2015-08-11 (×2): qty 1

## 2015-08-11 NOTE — H&P (Signed)
[facility] History and Physical  KENTAVIUS HOEFLING [id number] DOB: [birth year] DOA: 08/11/2015  Referring physician: Dr. Geoffry Paradise PCP: Gerrit Heck, MD   Chief Complaint: chest pain and SOB  HPI: Frank Dodson is a 65 y.o. male past medical history of Crohn's disease on steroids, essential hypertension, gout comes into the hospital for sudden onset of severe pressure radiating to his left lower abdomen that began on the day of admission. He relates nothing makes it better, he describes it as pressure, this has never happened before. With this he has dyspnea and he relates he relates that the breath make it worse. No nausea or vomiting.   Review of Systems:  Constitutional:  No weight loss, night sweats, Fevers, chills, fatigue.  HEENT:  No headaches, Difficulty swallowing,Tooth/dental problems,Sore throat,  No sneezing, itching, ear ache, nasal congestion, post nasal drip,  Cardio-vascular:   Orthopnea, PND, swelling in lower extremities, anasarca, dizziness, palpitations  GI:  No heartburn, indigestion, abdominal pain, nausea, vomiting, diarrhea, change in bowel habits, loss of appetite  Resp:  No excess mucus, no productive cough, No non-productive cough, No coughing up of blood.No change in color of mucus.No wheezing.No chest wall deformity  Skin:  no rash or lesions.  GU:  no dysuria, change in color of urine, no urgency or frequency. No flank pain.  Musculoskeletal:  No joint pain or swelling. No decreased range of motion. No back pain.  Psych:  No change in mood or affect. No depression or anxiety. No memory loss.   Past Medical History:  Diagnosis Date  . Crohn's colitis ([place])    history of due to surgery  . Decreased range of motion (ROM) of shoulder    "both shoulders; never had a rotator cuff tear; at some point in my life I've sheared both muscles off"  . Eczema   . Fecal urgency   . Frequent bowel movements    06-28-13  extreme urgency with bowel  movements "3-5" per day or more  . GERD (gastroesophageal reflux disease)   . History of gout   . Hypertension   . Iron deficiency anemia   . Osteoarthritis   . Pneumonia ~ 2007  . PONV (postoperative nausea and vomiting)    "with ether"  . Rheumatoid arthritis ([place])   . Rotator cuff tear    left - limited motion of arm   Past Surgical History:  Procedure Laterality Date  . COLECTOMY    . COLONOSCOPY    . CYSTOSCOPY  ~ 2013   "clean out of uric acid deposits"  . ENTEROSTOMY CLOSURE  11/2014  . ESOPHAGOGASTRODUODENOSCOPY    . JOINT REPLACEMENT    . LAPAROSCOPIC PARTIAL PROTECTOMY  10/2014   with J pouch  . REVERSE SHOULDER ARTHROPLASTY Left 05/15/2015  . REVERSE SHOULDER ARTHROPLASTY Left 05/15/2015   Procedure: LEFT REVERSE SHOULDER ARTHROPLASTY;  Surgeon: Justice Britain, MD;  Location: [facility];  Service: Orthopedics;  Laterality: Left;  . 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 TOTAL KNEE ARTHROPLASTY Right 09/04/2013   Procedure: RIGHT TOTAL KNEE ARTHROPLASTY;  Surgeon: Mauri Pole, MD;  Location: [facility];  Service: Orthopedics;  Laterality: Right;  . TOTAL KNEE ARTHROPLASTY Left 2011   Social History:  reports that he has never smoked. He has never used smokeless tobacco. He reports that he drinks alcohol. He reports that he does not use drugs.  No Known Allergies  Family History  Problem Relation  Age of Onset  . Subarachnoid hemorrhage Father   . Diabetes Mellitus II Father     Prior to Admission medications   Medication Sig Start Date End Date Taking? Authorizing Provider  acetaminophen (TYLENOL) 650 MG CR tablet Take 1,300 mg by mouth every 8 (eight) hours as needed for pain.    Historical Provider, MD  allopurinol (ZYLOPRIM) 100 MG tablet Take 100 mg by mouth 2 (two) times daily.     Historical Provider, MD  colchicine 0.6 MG tablet Take 0.6 mg by mouth every other day.    Historical Provider, MD  diazepam (VALIUM) 5 MG  tablet Take 0.5-1 tablets (2.5-5 mg total) by mouth every 6 (six) hours as needed for muscle spasms or sedation. Patient not taking: Reported on 08/11/2015 05/16/15   Jenetta Loges, PA-C  docusate sodium 100 MG CAPS Take 100 mg by mouth 2 (two) times daily. Patient not taking: Reported on 05/06/2015 09/05/13   Danae Orleans, PA-C  doxazosin (CARDURA) 4 MG tablet Take 4 mg by mouth every evening.    Historical Provider, MD  esomeprazole (NEXIUM) 40 MG capsule Take 40 mg by mouth daily.     Historical Provider, MD  ferrous fumarate (HEMOCYTE - 106 MG FE) 325 (106 FE) MG TABS tablet Take 1 tablet by mouth 2 (two) times daily.    Historical Provider, MD  hydrOXYzine (ATARAX/VISTARIL) 25 MG tablet Take 25 mg by mouth 3 (three) times daily as needed for itching.    Historical Provider, MD  loperamide (IMODIUM A-D) 2 MG tablet Take 2 mg by mouth 4 (four) times daily as needed for diarrhea or loose stools.    Historical Provider, MD  magnesium gluconate (MAGONATE) 500 MG tablet Take 500 mg by mouth daily.    Historical Provider, MD  mesalamine (LIALDA) 1.2 G EC tablet Take 4.8 g by mouth daily with breakfast.    Historical Provider, MD  methocarbamol (ROBAXIN) 500 MG tablet Take 1 tablet (500 mg total) by mouth every 6 (six) hours as needed for muscle spasms. Patient not taking: Reported on 05/06/2015 09/05/13   Danae Orleans, PA-C  Multiple Vitamin (MULTIVITAMIN WITH MINERALS) TABS tablet Take 1 tablet by mouth daily.    Historical Provider, MD  ondansetron (ZOFRAN) 4 MG tablet Take 1 tablet (4 mg total) by mouth every 8 (eight) hours as needed for nausea or vomiting. 05/16/15   Jenetta Loges, PA-C  oxyCODONE-acetaminophen (PERCOCET) 5-325 MG tablet Take 1-2 tablets by mouth every 4 (four) hours as needed. 05/16/15   Olivia Mackie Shuford, PA-C  polyethylene glycol (MIRALAX / GLYCOLAX) packet Take 17 g by mouth 2 (two) times daily. Patient not taking: Reported on 05/06/2015 09/05/13   Danae Orleans, PA-C  predniSONE  (DELTASONE) 5 MG tablet Take 5 mg by mouth daily.     Historical Provider, MD  quinapril (ACCUPRIL) 40 MG tablet Take 40 mg by mouth daily.     Historical Provider, MD  saccharomyces boulardii (FLORASTOR) 250 MG capsule Take 250 mg by mouth 2 (two) times daily. 06-28-13 States needed daily for recent "flare up" with Chron's"    Historical Provider, MD  traMADol (ULTRAM) 50 MG tablet Take 100 mg by mouth 2 (two) times daily.    Historical Provider, MD  vancomycin (VANCOCIN) 250 MG capsule Take 500 mg by mouth daily. 06-28-13 states "needed as prescribed for recent "falre up  With Chron's"    Historical Provider, MD  vitamin C (ASCORBIC ACID) 500 MG tablet Take 500 mg by mouth daily.  Historical Provider, MD  zolpidem (AMBIEN) 10 MG tablet Take 10 mg by mouth at bedtime as needed for sleep.    Historical Provider, MD   Physical Exam: Vitals:   08/11/15 1551 08/11/15 1611 08/11/15 1649 08/11/15 1700  BP: (!) 158/116 (!) 143/105 154/99 124/89  Pulse: (!) 146 (!) 142 (!) 133 98  Resp: (!) 34 24 (!) 30 (!) 31  Temp: 98.3 F (36.8 C)     TempSrc: Oral     SpO2: 94% 91% (!) 87% 94%  Weight:  79.4 kg (175 lb)    Height:  5\' 11"  (1.803 m)      Wt Readings from Last 3 Encounters:  08/11/15 79.4 kg (175 lb)  05/15/15 79.8 kg (176 lb)  05/07/15 80.2 kg (176 lb 12.8 oz)    General:  Appears calm and comfortable Eyes: PERRL, normal lids, irises & conjunctiva ENT: grossly normal hearing, lips & tongue Neck: no LAD, masses or thyromegaly Cardiovascular: RRR, no m/r/g. No LE edema. Telemetry: SR, no arrhythmias  Respiratory: Good air movement with a rub on the left side of the lung Abdomen: soft, ntnd Skin: Multiple surgical scars. Musculoskeletal: grossly normal tone BUE/BLE Psychiatric: grossly normal mood and affect, speech fluent and appropriate Neurologic: grossly non-focal.          Labs on Admission:  Basic Metabolic Panel:  Recent Labs Lab 08/11/15 1611 08/11/15 1631  NA 140   --   K 3.8  --   CL 104  --   CO2 25  --   GLUCOSE 138*  --   BUN 21*  --   CREATININE 1.31* 1.20  CALCIUM 9.2  --    Liver Function Tests:  Recent Labs Lab 08/11/15 1611  AST 29  ALT 27  ALKPHOS 68  BILITOT 0.6  PROT 8.3*  ALBUMIN 4.5    Recent Labs Lab 08/11/15 1611  LIPASE 43   No results for input(s): AMMONIA in the last 168 hours. CBC:  Recent Labs Lab 08/11/15 1611  WBC 10.8*  NEUTROABS 5.9  HGB 11.7*  HCT 35.8*  MCV 95.7  PLT 272   Cardiac Enzymes: No results for input(s): CKTOTAL, CKMB, CKMBINDEX, TROPONINI in the last 168 hours.  BNP (last 3 results) No results for input(s): BNP in the last 8760 hours.  ProBNP (last 3 results) No results for input(s): PROBNP in the last 8760 hours.  CBG: No results for input(s): GLUCAP in the last 168 hours.  Radiological Exams on Admission: Ct Angio Chest/abd/pel For Dissection W And/or Wo Contrast  Result Date: 08/11/2015 CLINICAL DATA:  Pt began to have central CP yesterday. Today while at lunch pain began to get worse suddenly. Accompanied by SOB. Pain radiates to L side of abd. EXAM: CT ANGIO CHEST-ABD-PELV FOR DISSECTION W/ AND WO/W CM<Procedure Description>CT ANGIO CHEST-ABD-PELV FOR DISSECTION W/ AND WO/W CM TECHNIQUE: Unenhanced CT imaging of the chest was performed. CT imaging was then acquired through the chest, abdomen and pelvis following the dynamic injection of 100 mL of Isovue 370 intravenous contrast. Computer-generated coronal and sagittal MIP and MPR images were also obtained. CONTRAST:  100 mL of Isovue 370 intravenous contrast. COMPARISON:  Chest radiograph, 06/28/2013. Abdomen and pelvis CT, 03/23/2012. None. FINDINGS: CT CHEST ANGIOGRAPHIC FINDINGS: Thoracic aorta is normal in caliber. There is minor atherosclerotic calcifications along the arch and at the origin of the left subclavian artery and in the innominate artery. a there are bilateral pulmonary emboli. On the left, near occlusive embolus  is  seen in the lower lobe pulmonary artery extending into segmental branches. On the right, new pulmonary embolus straddles the right middle lobe and lower lobe pulmonary arteries. Segmental branch emboli are noted in the right lower lobe and right middle lobe. Possible emboli are noted in the right upper lobe segmental and subsegmental branches. NON ANGIOGRAPHIC FINDINGS: Neck base and axilla:  No mass or adenopathy. Mediastinum and hila: Heart normal in size and configuration. No significant coronary artery calcifications. No mediastinal or hilar masses or enlarged lymph nodes. Lungs and pleura: Patchy airspace opacity is noted in the lower lobes, left greater than right. Mild prominence of the vascular markings are noted bilaterally. Minimal left pleural effusion. No pneumothorax. CT ABDOMEN PELVIS ANGIOGRAPHIC FINDINGS: The abdominal aorta is normal in caliber. No dissection. No atherosclerotic plaque. The celiac axis, superior mesenteric artery single bilateral renal arteries and the inferior mesenteric artery are widely patent. There is minor partly calcified plaque along the left common iliac artery and at the bifurcation of the right common iliac artery and in both internal iliac arteries. Plaque is noted in the common femoral arteries, greater on the right. No significant stenosis. There is nonocclusive thrombus in the superior mesenteric vein centrally. NON ANGIOGRAPHIC FINDINGS: Liver, spleen, gallbladder, pancreas, adrenal glands:  Unremarkable. Kidneys, ureters, bladder: Mild renal cortical thinning. Nonobstructing stone and in the lower pole the right kidney. No renal masses. No hydronephrosis. Normal ureters. Normal bladder. Lymph nodes:  No adenopathy. Gastrointestinal: Small hiatal hernia. Stomach otherwise unremarkable. Normal small bowel. There is a rectosigmoid anastomosis staple line. Another staple line is noted involving the right colon. There is no colon dilation, wall thickening or adjacent  inflammation. MUSCULOSKELETAL FINDINGS: There are degenerative changes of the spine mostly the lumbar spine. Bones are demineralized. There are no osteoblastic or osteolytic lesions. IMPRESSION: CT CHEST IMPRESSION 1. Bilateral pulmonary emboli, most prominently in the left lower lobe. There is left greater than right lower lobe airspace opacity that is likely atelectasis. Early pulmonary infarction is not excluded. 2. Essentially normal thoracoabdominal aorta.  No dissection. CT ABDOMEN AND PELVIS IMPRESSION 1. Normal appearance of the abdominal aorta. No dissection. Branch vessels are widely patent. 2. Nonocclusive thrombus noted in the superior mesenteric vein. 3. No other acute findings in the abdomen or pelvis. Electronically Signed   By: Lajean Manes M.D.   On: 08/11/2015 17:10    EKG: Independently reviewed. Sinus tachycardia specific T-wave changes normal axis.  Assessment/Plan [facility]): His last colonoscopy was last year, I'll get a coagulation panel and factor V Leyden. I agree with IV heparin, will admit to telemetry due to his mild tachycardia was likely due to his PE. Given PCA pump. Case manager consult for Xarelto. Hypercoagulable panel has been ordered, along with an ANA and factor V Leyden  LeukocytosisLactic acidosis He has a febrile, is likely due to stressed emargination. We'll start a much gentle IV fluid hydration.  Crohn's disease: Continue Imodium and other home medications.  Osteoarthritis: Narcotics for pain should help with pain controlled.  Code Status: full DVT Prophylaxis:heparin Family Communication: wife Disposition Plan: inpatient  Time spent: 35 min  FELIZ Marguarite Arbour [facility] Pager [phone]

## 2015-08-11 NOTE — ED Notes (Signed)
EDP and RN made aware of critical Istat result. 

## 2015-08-11 NOTE — ED Notes (Signed)
18:24 pt can go to floor.

## 2015-08-11 NOTE — ED Notes (Signed)
PTT [address]ote primary RN made aware.

## 2015-08-11 NOTE — Progress Notes (Signed)
ANTICOAGULATION CONSULT NOTE - Initial Consult  Pharmacy Consult for Heparin Indication: pulmonary embolus  No Known Allergies  Patient Measurements: Height: 5\' 11"  (180.3 cm) Weight: 175 lb (79.4 kg) IBW/kg (Calculated) : 75.3 HEPARIN DW (KG): 79.4   Vital Signs: Temp: 98.3 F (36.8 C) (07/31 1551) Temp Source: Oral (07/31 1551) BP: 124/89 (07/31 1700) Pulse Rate: 98 (07/31 1700)  Labs:  Recent Labs  08/11/15 1611 08/11/15 1631  HGB 11.7*  --   HCT 35.8*  --   PLT 272  --   CREATININE 1.31* 1.20    Estimated Creatinine Clearance: 67.1 mL/min (by C-G formula based on SCr of 1.2 mg/dL).   Medical History: Past Medical History:  Diagnosis Date  . Crohn's colitis ([place])    history of due to surgery  . Decreased range of motion (ROM) of shoulder    "both shoulders; never had a rotator cuff tear; at some point in my life I've sheared both muscles off"  . Eczema   . Fecal urgency   . Frequent bowel movements    06-28-13  extreme urgency with bowel movements "3-5" per day or more  . GERD (gastroesophageal reflux disease)   . History of gout   . Hypertension   . Iron deficiency anemia   . Osteoarthritis   . Pneumonia ~ 2007  . PONV (postoperative nausea and vomiting)    "with ether"  . Rheumatoid arthritis ([place])   . Rotator cuff tear    left - limited motion of arm    Medications:  Infusions:  . heparin      Assessment: [name] with +PE to start on IV heparin.  CBC reviewed- no bleeding noted.   Goal of Therapy:  Heparin level 0.3-0.7 units/ml Monitor platelets by anticoagulation protocol: Yes   Plan:  Give 5000 units bolus x 1 Start heparin infusion at 1400 units/hr Check anti-Xa level in 6 hours and daily while on heparin Continue to monitor H&H and platelets  Netta Cedars, PharmD, BCPS Pager: [phone] 08/11/2015,5:27 PM

## 2015-08-11 NOTE — ED Provider Notes (Signed)
Emergency Department Provider Note   I have reviewed the triage vital signs and the nursing notes.   HISTORY  Chief Complaint Chest Pain   HPI Frank Dodson is a 65 y.o. male with PMH history of Crohn's colitis, HTN, and RA presents to the emergency department for evaluation of sudden onset severe chest pressure radiating to his left lower abdomen. The patient reports that symptoms began abruptly at lunch with no apparent provocation. The patient has never had similar pain in his chest before. He reports associated dyspnea and severe discomfort. Wife states he felt somewhat uncomfortable last night but this resolved without problem. No vomiting no diarrhea, no blood per rectum.    Past Medical History:  Diagnosis Date  . Crohn's colitis ([place])    history of due to surgery  . Decreased range of motion (ROM) of shoulder    "both shoulders; never had a rotator cuff tear; at some point in my life I've sheared both muscles off"  . Eczema   . Fecal urgency   . Frequent bowel movements    06-28-13  extreme urgency with bowel movements "3-5" per day or more  . GERD (gastroesophageal reflux disease)   . History of gout   . Hypertension   . Iron deficiency anemia   . Osteoarthritis   . Pneumonia ~ 2007  . PONV (postoperative nausea and vomiting)    "with ether"  . Rheumatoid arthritis ([place])   . Rotator cuff tear    left - limited motion of arm    Patient Active Problem List   Diagnosis Date Noted  . 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 Expected blood loss anemia 09/05/2013  . S/P right TKA 09/04/2013    Past Surgical History:  Procedure Laterality Date  . COLECTOMY    . COLONOSCOPY    . CYSTOSCOPY  ~ 2013   "clean out of uric acid deposits"  . ENTEROSTOMY CLOSURE  11/2014  . ESOPHAGOGASTRODUODENOSCOPY    . JOINT REPLACEMENT    . LAPAROSCOPIC PARTIAL PROTECTOMY  10/2014   with J pouch  . REVERSE SHOULDER ARTHROPLASTY Left 05/15/2015  . REVERSE SHOULDER  ARTHROPLASTY Left 05/15/2015   Procedure: LEFT REVERSE SHOULDER ARTHROPLASTY;  Surgeon: Justice Britain, MD;  Location: [facility];  Service: Orthopedics;  Laterality: Left;  . 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 TOTAL KNEE ARTHROPLASTY Right 09/04/2013   Procedure: RIGHT TOTAL KNEE ARTHROPLASTY;  Surgeon: Mauri Pole, MD;  Location: [facility];  Service: Orthopedics;  Laterality: Right;  . TOTAL KNEE ARTHROPLASTY Left 2011    Current Outpatient Rx  . Order #: [id number] Class: Historical Med  . Order #: [id number] Class: Historical Med  . Order #: [id number] Class: Historical Med  . Order #: [id number] Class: Print  . Order #: [id number] Class: No Print  . Order #: [id number] Class: Historical Med  . Order #: [id number] Class: Historical Med  . Order #: [id number] Class: Historical Med  . Order #: [id number] Class: Historical Med  . Order #: [id number] Class: Historical Med  . Order #: [id number] Class: Historical Med  . Order #: [id number] Class: Historical Med  . Order #: [id number] Class: Print  . Order #: [id number] Class: Historical Med  . Order #: [id number] Class: Print  . Order #: [id number] Class: Print  . Order #: [id number] Class: No Print  . Order #: [id number] Class: Historical Med  . Order #: [id number] Class: Historical Med  . Order #: [id number] Class: Historical Med  . Order #: [id number] Class: Historical Med  .  Order #: [id number] Class: Historical Med  . Order #: [id number] Class: Historical Med  . Order #: [id number] Class: Historical Med    Allergies Review of patient's allergies indicates no known allergies.  History reviewed. No pertinent family history.  Social History Social History  Substance Use Topics  . Smoking status: Never Smoker  . Smokeless tobacco: Never Used  . Alcohol use Yes     Comment: 05/15/2015 "might have 6 beers/month"    Review of Systems  Constitutional: No fever/chills Eyes: No visual changes. ENT: No sore  throat. Cardiovascular: Severe chest pain. Respiratory: Positive shortness of breath. Gastrointestinal: Severe abdominal pain.  No nausea, no vomiting.  No diarrhea.  No constipation. Genitourinary: Negative for dysuria. Musculoskeletal: Negative for back pain. Skin: Negative for rash. Neurological: Negative for headaches, focal weakness or numbness.  10-point ROS otherwise negative.  ____________________________________________   PHYSICAL EXAM:  VITAL SIGNS: ED Triage Vitals  Enc Vitals Group     BP 08/11/15 1551 (!) 158/116     Pulse Rate 08/11/15 1551 (!) 146     Resp 08/11/15 1551 (!) 34     Temp 08/11/15 1551 98.3 F (36.8 C)     Temp Source 08/11/15 1551 Oral     SpO2 08/11/15 1551 94 %     Weight 08/11/15 1611 175 lb (79.4 kg)     Height 08/11/15 1611 5\' 11"  (1.803 m)     Pain Score 08/11/15 1549 10    Constitutional: Alert and oriented. Appears to be in acute distress. Eyes: Conjunctivae are normal. PERRL. EOMI. Head: Atraumatic. Nose: No congestion/rhinnorhea. Mouth/Throat: Mucous membranes are moist.  Oropharynx non-erythematous. Neck: No stridor.   Cardiovascular: Sinus tachycardia. Good peripheral circulation. Grossly normal heart sounds.   Respiratory: Normal respiratory effort.  No retractions. Lungs CTAB. Gastrointestinal: Firm and diffusely tender to palpation. No rebound. No distention.  Musculoskeletal: No lower extremity tenderness nor edema. No gross deformities of extremities. Neurologic:  Normal speech and language. No gross focal neurologic deficits are appreciated.  Skin:  Skin is warm, dry and intact. No rash noted. Psychiatric: Mood and affect are normal. Speech and behavior are normal.  ____________________________________________   LABS (all labs ordered are listed, but only abnormal results are displayed)  Labs Reviewed  CBC - Abnormal; Notable for the following:       Result Value   WBC 10.8 (*)    RBC 3.74 (*)    Hemoglobin 11.7  (*)    HCT 35.8 (*)    All other components within normal limits  I-STAT CG4 LACTIC ACID, ED - Abnormal; Notable for the following:    Lactic Acid, Venous 2.90 (*)    All other components within normal limits  BASIC METABOLIC PANEL  CBC WITH DIFFERENTIAL/PLATELET  DIFFERENTIAL  HEPATIC FUNCTION PANEL  LIPASE, BLOOD  I-STAT TROPOININ, ED  I-STAT CHEM 8, ED  I-STAT CREATININE, ED   ____________________________________________  EKG  Reviewed in MUSE. No STEMI.  ____________________________________________  RADIOLOGY  Ct Angio Chest/abd/pel For Dissection W And/or Wo Contrast  Result Date: 08/11/2015 CLINICAL DATA:  Pt began to have central CP yesterday. Today while at lunch pain began to get worse suddenly. Accompanied by SOB. Pain radiates to L side of abd. EXAM: CT ANGIO CHEST-ABD-PELV FOR DISSECTION W/ AND WO/W CM<Procedure Description>CT ANGIO CHEST-ABD-PELV FOR DISSECTION W/ AND WO/W CM TECHNIQUE: Unenhanced CT imaging of the chest was performed. CT imaging was then acquired through the chest, abdomen and pelvis following the dynamic injection of 100 mL of Isovue 370  intravenous contrast. Computer-generated coronal and sagittal MIP and MPR images were also obtained. CONTRAST:  100 mL of Isovue 370 intravenous contrast. COMPARISON:  Chest radiograph, 06/28/2013. Abdomen and pelvis CT, 03/23/2012. None. FINDINGS: CT CHEST ANGIOGRAPHIC FINDINGS: Thoracic aorta is normal in caliber. There is minor atherosclerotic calcifications along the arch and at the origin of the left subclavian artery and in the innominate artery. a there are bilateral pulmonary emboli. On the left, near occlusive embolus is seen in the lower lobe pulmonary artery extending into segmental branches. On the right, new pulmonary embolus straddles the right middle lobe and lower lobe pulmonary arteries. Segmental branch emboli are noted in the right lower lobe and right middle lobe. Possible emboli are noted in the right  upper lobe segmental and subsegmental branches. NON ANGIOGRAPHIC FINDINGS: Neck base and axilla:  No mass or adenopathy. Mediastinum and hila: Heart normal in size and configuration. No significant coronary artery calcifications. No mediastinal or hilar masses or enlarged lymph nodes. Lungs and pleura: Patchy airspace opacity is noted in the lower lobes, left greater than right. Mild prominence of the vascular markings are noted bilaterally. Minimal left pleural effusion. No pneumothorax. CT ABDOMEN PELVIS ANGIOGRAPHIC FINDINGS: The abdominal aorta is normal in caliber. No dissection. No atherosclerotic plaque. The celiac axis, superior mesenteric artery single bilateral renal arteries and the inferior mesenteric artery are widely patent. There is minor partly calcified plaque along the left common iliac artery and at the bifurcation of the right common iliac artery and in both internal iliac arteries. Plaque is noted in the common femoral arteries, greater on the right. No significant stenosis. There is nonocclusive thrombus in the superior mesenteric vein centrally. NON ANGIOGRAPHIC FINDINGS: Liver, spleen, gallbladder, pancreas, adrenal glands:  Unremarkable. Kidneys, ureters, bladder: Mild renal cortical thinning. Nonobstructing stone and in the lower pole the right kidney. No renal masses. No hydronephrosis. Normal ureters. Normal bladder. Lymph nodes:  No adenopathy. Gastrointestinal: Small hiatal hernia. Stomach otherwise unremarkable. Normal small bowel. There is a rectosigmoid anastomosis staple line. Another staple line is noted involving the right colon. There is no colon dilation, wall thickening or adjacent inflammation. MUSCULOSKELETAL FINDINGS: There are degenerative changes of the spine mostly the lumbar spine. Bones are demineralized. There are no osteoblastic or osteolytic lesions. IMPRESSION: CT CHEST IMPRESSION 1. Bilateral pulmonary emboli, most prominently in the left lower lobe. There is left  greater than right lower lobe airspace opacity that is likely atelectasis. Early pulmonary infarction is not excluded. 2. Essentially normal thoracoabdominal aorta.  No dissection. CT ABDOMEN AND PELVIS IMPRESSION 1. Normal appearance of the abdominal aorta. No dissection. Branch vessels are widely patent. 2. Nonocclusive thrombus noted in the superior mesenteric vein. 3. No other acute findings in the abdomen or pelvis. Electronically Signed   By: Lajean Manes M.D.   On: 08/11/2015 17:10    ____________________________________________   PROCEDURES  Procedure(s) performed:   Procedures  CRITICAL CARE Performed by: Margette Fast Total critical care time: 40 minutes Critical care time was exclusive of separately billable procedures and treating other patients. Critical care was necessary to treat or prevent imminent or life-threatening deterioration. Critical care was time spent personally by me on the following activities: development of treatment plan with patient and/or surrogate as well as nursing, discussions with consultants, evaluation of patient's response to treatment, examination of patient, obtaining history from patient or surrogate, ordering and performing treatments and interventions, ordering and review of laboratory studies, ordering and review of radiographic studies, pulse oximetry and  re-evaluation of patient's condition.  Nanda Quinton, MD Emergency Medicine  ____________________________________________   INITIAL IMPRESSION / ASSESSMENT AND PLAN / ED COURSE  Pertinent labs & imaging results that were available during my care of the patient were reviewed by me and considered in my medical decision making (see chart for details).  On my initial evaluation the patient is in extremis. He has severe tachycardia and elevated blood pressure. He is very uncomfortable appearing, clutching his chest and abdomen. His abdomen is firm and moderately tender. Bedside ultrasound of the  abdomen does not show obvious free fluid or clear AAA. I repeated the patient's EKG and reviewed his triage EKG. It shows sinus tachycardia with no acute STEMI. I'm concerned for aortic dissection with this patient's presentation and exam. Plan to control his blood pressure daily with labetalol and pain medication. Have ordered a nicardipine infusion and will rushed to CT to evaluate for possible dissection. Patient has a lactic acid of 2.9.   05:26 PM BIlateral PE with areas of infarct noted. No Nicardipine given. Treating pain and ordered heparin. Will consult hospitalist.   Placed orders for hospitalist admission. Heparin started. Patient with improved pain control.   Updated patient and reviewed available labs, imaging, EKG, and orders.  ____________________________________________  FINAL CLINICAL IMPRESSION(S) / ED DIAGNOSES  Final diagnoses:  Other acute pulmonary embolism without acute cor pulmonale (HCC)     MEDICATIONS GIVEN DURING THIS VISIT:  Medications  nicardipine (CARDENE) 20mg  in 0.86% saline 247ml IV infusion (0.1 mg/ml) (not administered)  labetalol (NORMODYNE,TRANDATE) injection 10 mg (not administered)  morphine 4 MG/ML injection 4 mg (4 mg Intravenous Given 08/11/15 1633)  iopamidol (ISOVUE-370) 76 % injection 100 mL (100 mLs Intravenous Contrast Given 08/11/15 1638)     NEW OUTPATIENT MEDICATIONS STARTED DURING THIS VISIT:  None   Note:  This document was prepared using Dragon voice recognition software and may include unintentional dictation errors.  Nanda Quinton, MD Emergency Medicine   Margette Fast, MD 08/12/15 [phone]

## 2015-08-11 NOTE — Progress Notes (Signed)
The medication history was completed after the home meds were ordered. Orders for the following meds were rejected because they had been stopped:    Colchicine  Docusate  Protonix (sub for esomeprazole)  Mesalamine  Methocarbamol  Ondansetron  Oxycodone/acetaminophen  Miralax  Florastor  Vancomycin PO   Romeo Rabon, PharmD, pager [phone]. 08/11/2015,8:28 PM.

## 2015-08-11 NOTE — ED Triage Notes (Addendum)
Pt began to have central CP yesterday. Today while at lunch pain began to get worse suddenly. Accompanied by SOB. Pain radiates to L side of abd.

## 2015-08-12 ENCOUNTER — Inpatient Hospital Stay (HOSPITAL_COMMUNITY): Payer: BLUE CROSS/BLUE SHIELD

## 2015-08-12 DIAGNOSIS — R0600 Dyspnea, unspecified: Secondary | ICD-10-CM

## 2015-08-12 LAB — CBC
HCT: 31.9 % — ABNORMAL LOW (ref 39.0–52.0)
HEMOGLOBIN: 10.8 g/dL — AB (ref 13.0–17.0)
MCH: 32.2 pg (ref 26.0–34.0)
MCHC: 33.9 g/dL (ref 30.0–36.0)
MCV: 95.2 fL (ref 78.0–100.0)
PLATELETS: 224 10*3/uL (ref 150–400)
RBC: 3.35 MIL/uL — AB (ref 4.22–5.81)
RDW: 14.1 % (ref 11.5–15.5)
WBC: 10.4 10*3/uL (ref 4.0–10.5)

## 2015-08-12 LAB — BASIC METABOLIC PANEL
ANION GAP: 10 (ref 5–15)
BUN: 24 mg/dL — AB (ref 6–20)
CHLORIDE: 101 mmol/L (ref 101–111)
CO2: 27 mmol/L (ref 22–32)
Calcium: 8.7 mg/dL — ABNORMAL LOW (ref 8.9–10.3)
Creatinine, Ser: 1.24 mg/dL (ref 0.61–1.24)
Glucose, Bld: 131 mg/dL — ABNORMAL HIGH (ref 65–99)
POTASSIUM: 4.2 mmol/L (ref 3.5–5.1)
SODIUM: 138 mmol/L (ref 135–145)

## 2015-08-12 LAB — HEPARIN LEVEL (UNFRACTIONATED)
Heparin Unfractionated: 0.34 IU/mL (ref 0.30–0.70)
Heparin Unfractionated: 0.48 IU/mL (ref 0.30–0.70)

## 2015-08-12 LAB — ECHOCARDIOGRAM COMPLETE
HEIGHTINCHES: 71 in
WEIGHTICAEL: 2934.76 [oz_av]

## 2015-08-12 LAB — ANTITHROMBIN III: ANTITHROMB III FUNC: 111 % (ref 75–120)

## 2015-08-12 LAB — PROTIME-INR
INR: 1.14
PROTHROMBIN TIME: 14.7 s (ref 11.4–15.2)

## 2015-08-12 MED ORDER — PROMETHAZINE HCL 25 MG/ML IJ SOLN
12.5000 mg | Freq: Once | INTRAMUSCULAR | Status: AC
  Administered 2015-08-13: 12.5 mg via INTRAVENOUS
  Filled 2015-08-12: qty 1

## 2015-08-12 NOTE — Progress Notes (Signed)
[facility] PROGRESS NOTE    Progress Note  Frank Dodson  [id number] DOB: 26-Nov-1950 DOA: 08/11/2015 PCP: Frank Heck, MD     Brief Narrative:   Frank Dodson is an 65 y.o. male past medical history of Crohn's disease on steroids essential hypertension that comes in for sudden onset of severe chest pressure radiating to his left lower abdomen that began on the day of admission CT scan was done that shows large acute pulmonary emboli  Assessment/Plan:   Acute pulmonary embolism (HCC)/Leukocytosis/ Lactic acidosis: Hypercoagulable panel is pending, his heart rate has improved. Cont heparin per pharmacy, FOBT stools. CBC daily.    DVT prophylaxis: heparin Family Communication:Wife Disposition Plan/Barrier to D/C: unable to determine Code Status:     Code Status Orders        Start     Ordered   08/11/15 2011  Full code  Continuous     08/11/15 2010    Code Status History    Date Active Date Inactive Code Status Order ID Comments User Context   09/04/2013  7:59 PM 09/05/2013  5:12 PM Full Code [id number]  Frank Passy Babish, PA-C Inpatient        IV Access:    Peripheral IV   Procedures and diagnostic studies:   Ct Angio Chest/abd/pel For Dissection W And/or Wo Contrast  Result Date: 08/11/2015 CLINICAL DATA:  Pt began to have central CP yesterday. Today while at lunch pain began to get worse suddenly. Accompanied by SOB. Pain radiates to L side of abd. EXAM: CT ANGIO CHEST-ABD-PELV FOR DISSECTION W/ AND WO/W CM<Procedure Description>CT ANGIO CHEST-ABD-PELV FOR DISSECTION W/ AND WO/W CM TECHNIQUE: Unenhanced CT imaging of the chest was performed. CT imaging was then acquired through the chest, abdomen and pelvis following the dynamic injection of 100 mL of Isovue 370 intravenous contrast. Computer-generated coronal and sagittal MIP and MPR images were also obtained. CONTRAST:  100 mL of Isovue 370 intravenous contrast. COMPARISON:  Chest radiograph,  06/28/2013. Abdomen and pelvis CT, 03/23/2012. None. FINDINGS: CT CHEST ANGIOGRAPHIC FINDINGS: Thoracic aorta is normal in caliber. There is minor atherosclerotic calcifications along the arch and at the origin of the left subclavian artery and in the innominate artery. a there are bilateral pulmonary emboli. On the left, near occlusive embolus is seen in the lower lobe pulmonary artery extending into segmental branches. On the right, new pulmonary embolus straddles the right middle lobe and lower lobe pulmonary arteries. Segmental branch emboli are noted in the right lower lobe and right middle lobe. Possible emboli are noted in the right upper lobe segmental and subsegmental branches. NON ANGIOGRAPHIC FINDINGS: Neck base and axilla:  No mass or adenopathy. Mediastinum and hila: Heart normal in size and configuration. No significant coronary artery calcifications. No mediastinal or hilar masses or enlarged lymph nodes. Lungs and pleura: Patchy airspace opacity is noted in the lower lobes, left greater than right. Mild prominence of the vascular markings are noted bilaterally. Minimal left pleural effusion. No pneumothorax. CT ABDOMEN PELVIS ANGIOGRAPHIC FINDINGS: The abdominal aorta is normal in caliber. No dissection. No atherosclerotic plaque. The celiac axis, superior mesenteric artery single bilateral renal arteries and the inferior mesenteric artery are widely patent. There is minor partly calcified plaque along the left common iliac artery and at the bifurcation of the right common iliac artery and in both internal iliac arteries. Plaque is noted in the common femoral arteries, greater on the right. No significant stenosis. There is nonocclusive thrombus in the superior mesenteric  vein centrally. NON ANGIOGRAPHIC FINDINGS: Liver, spleen, gallbladder, pancreas, adrenal glands:  Unremarkable. Kidneys, ureters, bladder: Mild renal cortical thinning. Nonobstructing stone and in the lower pole the right kidney. No  renal masses. No hydronephrosis. Normal ureters. Normal bladder. Lymph nodes:  No adenopathy. Gastrointestinal: Small hiatal hernia. Stomach otherwise unremarkable. Normal small bowel. There is a rectosigmoid anastomosis staple line. Another staple line is noted involving the right colon. There is no colon dilation, wall thickening or adjacent inflammation. MUSCULOSKELETAL FINDINGS: There are degenerative changes of the spine mostly the lumbar spine. Bones are demineralized. There are no osteoblastic or osteolytic lesions. IMPRESSION: CT CHEST IMPRESSION 1. Bilateral pulmonary emboli, most prominently in the left lower lobe. There is left greater than right lower lobe airspace opacity that is likely atelectasis. Early pulmonary infarction is not excluded. 2. Essentially normal thoracoabdominal aorta.  No dissection. CT ABDOMEN AND PELVIS IMPRESSION 1. Normal appearance of the abdominal aorta. No dissection. Branch vessels are widely patent. 2. Nonocclusive thrombus noted in the superior mesenteric vein. 3. No other acute findings in the abdomen or pelvis. Electronically Signed   By: Frank Dodson M.D.   On: 08/11/2015 17:10     Medical Consultants:    None.  Anti-Infectives:   none  Subjective:    Frank Dodson pain is control  Objective:    Vitals:   08/11/15 2101 08/11/15 2345 08/12/15 0400 08/12/15 0613  BP:    (!) 165/100  Pulse:    (!) 125  Resp: (!) 31 (!) 24 (!) 23 (!) 26  Temp:    99 F (37.2 C)  TempSrc:    Oral  SpO2: 96% 94% 93% 95%  Weight:      Height:        Intake/Output Summary (Last 24 hours) at 08/12/15 0831 Last data filed at 08/12/15 0600  Gross per 24 hour  Intake           436.67 ml  Output                0 ml  Net           436.67 ml   Filed Weights   08/11/15 1611 08/11/15 2028  Weight: 79.4 kg (175 lb) 83.2 kg (183 lb 6.8 oz)    Exam: General exam: In no acute distress. Respiratory system: Good air movement and clear to  auscultation. Cardiovascular system: S1 & S2 heard, RRR.  Gastrointestinal system: Abdomen is nondistended, soft and nontender.  Central nervous system: Alert and oriented. No focal neurological deficits. Extremities: No pedal edema. Skin: No rashes, lesions or ulcers Psychiatry: Judgement and insight appear normal. Mood & affect appropriate.    Data Reviewed:    Labs: Basic Metabolic Panel:  Recent Labs Lab 08/11/15 1611 08/11/15 1631 08/12/15 0640  NA 140  --  138  K 3.8  --  4.2  CL 104  --  101  CO2 25  --  27  GLUCOSE 138*  --  131*  BUN 21*  --  24*  CREATININE 1.31* 1.20 1.24  CALCIUM 9.2  --  8.7*   GFR Estimated Creatinine Clearance: 64.9 mL/min (by C-G formula based on SCr of 1.24 mg/dL). Liver Function Tests:  Recent Labs Lab 08/11/15 1611  AST 29  ALT 27  ALKPHOS 68  BILITOT 0.6  PROT 8.3*  ALBUMIN 4.5    Recent Labs Lab 08/11/15 1611  LIPASE 43   No results for input(s): AMMONIA in the last 168 hours. Coagulation  profile  Recent Labs Lab 08/12/15 0640  INR 1.14    CBC:  Recent Labs Lab 08/11/15 1611 08/11/15 1824 08/12/15 0640  WBC 10.8*  --  10.4  NEUTROABS 5.9  --   --   HGB 11.7*  --  10.8*  HCT 35.8*  --  31.9*  MCV 95.7  --  95.2  PLT 272 240 224   Cardiac Enzymes: No results for input(s): CKTOTAL, CKMB, CKMBINDEX, TROPONINI in the last 168 hours. BNP (last 3 results) No results for input(s): PROBNP in the last 8760 hours. CBG: No results for input(s): GLUCAP in the last 168 hours. D-Dimer:  Recent Labs  08/11/15 1631  DDIMER 5.56*   Hgb A1c: No results for input(s): HGBA1C in the last 72 hours. Lipid Profile: No results for input(s): CHOL, HDL, LDLCALC, TRIG, CHOLHDL, LDLDIRECT in the last 72 hours. Thyroid function studies: No results for input(s): TSH, T4TOTAL, T3FREE, THYROIDAB in the last 72 hours.  Invalid input(s): FREET3 Anemia work up: No results for input(s): VITAMINB12, FOLATE, FERRITIN, TIBC,  IRON, RETICCTPCT in the last 72 hours. Sepsis Labs:  Recent Labs Lab 08/11/15 1611 08/11/15 1632 08/12/15 0640  WBC 10.8*  --  10.4  LATICACIDVEN  --  2.90*  --    Microbiology No results found for this or any previous visit (from the past 240 hour(s)).   Medications:   . allopurinol  100 mg Oral BID  . doxazosin  4 mg Oral QPM  . Ferrous Fumarate  1 tablet Oral BID  . HYDROmorphone   Intravenous Q4H  . magnesium gluconate  500 mg Oral Daily  . multivitamin with minerals  1 tablet Oral Daily  . predniSONE  5 mg Oral Daily  . sodium chloride flush  3 mL Intravenous Q12H  . vitamin C  500 mg Oral Daily   Continuous Infusions: . sodium chloride 50 mL/hr at 08/11/15 2116  . heparin 1,400 Units/hr (08/11/15 1808)    Time spent: 25 min   LOS: 1 day   Charlynne Cousins  [facility] Pager [phone]  *Please refer to [email or web address], password [email or web address] to get updated schedule on who will round on this patient, as hospitalists switch teams weekly. If 7PM-7AM, please contact night-coverage at [email or web address], password [email or web address] for any overnight needs.  08/12/2015, 8:31 AM

## 2015-08-12 NOTE — Progress Notes (Signed)
ANTICOAGULATION CONSULT NOTE - f/u Consult  Pharmacy Consult for Heparin Indication: pulmonary embolus  No Known Allergies  Patient Measurements: Height: 5\' 11"  (180.3 cm) Weight: 183 lb 6.8 oz (83.2 kg) IBW/kg (Calculated) : 75.3 HEPARIN DW (KG): 83.2   Vital Signs: Temp: 99 F (37.2 C) (08/01 0613) Temp Source: Oral (08/01 0613) BP: 165/100 (08/01 0613) Pulse Rate: 125 (08/01 0613)  Labs:  Recent Labs  08/11/15 1611 08/11/15 1631 08/11/15 1824 08/12/15 0019 08/12/15 0640 08/12/15 1154  HGB 11.7*  --   --   --  10.8*  --   HCT 35.8*  --   --   --  31.9*  --   PLT 272  --  240  --  224  --   APTT  --   --  177*  --   --   --   LABPROT  --   --   --   --  14.7  --   INR  --   --   --   --  1.14  --   HEPARINUNFRC  --   --   --  0.48  --  0.34  CREATININE 1.31* 1.20  --   --  1.24  --     Estimated Creatinine Clearance: 64.9 mL/min (by C-G formula based on SCr of 1.24 mg/dL).   Medical History: Past Medical History:  Diagnosis Date  . Crohn's colitis ([place])    history of due to surgery  . Decreased range of motion (ROM) of shoulder    "both shoulders; never had a rotator cuff tear; at some point in my life I've sheared both muscles off"  . Eczema   . Fecal urgency   . Frequent bowel movements    06-28-13  extreme urgency with bowel movements "3-5" per day or more  . GERD (gastroesophageal reflux disease)   . History of gout   . Hypertension   . Iron deficiency anemia   . Osteoarthritis   . Pneumonia ~ 2007  . PONV (postoperative nausea and vomiting)    "with ether"  . Rheumatoid arthritis ([employer])   . Rotator cuff tear    left - limited motion of arm    Medications:  Infusions:  . sodium chloride 50 mL/hr at 08/11/15 2116  . heparin 1,400 Units/hr (08/12/15 0500)    Assessment: [name] with +PE to start on IV heparin.  CBC reviewed- no bleeding noted.   Today, 8/1 0019 HL=0.48 (at goal) , no infusion or bleeding problems per RN  1200 HL= 0.34  confirmatory  Goal of Therapy:  Heparin level 0.3-0.7 units/ml Monitor platelets by anticoagulation protocol: Yes   Plan:  Continue heparin drip at 1400 units/hr Daily heparin level Continue to monitor H&H and platelets   Dolly Rias RPh 08/12/2015, 12:45 PM Pager [phone]

## 2015-08-12 NOTE — Care Management Note (Signed)
Case Management Note  Patient Details  Name: Frank Dodson MRN: [id number] Date of Birth: [birth year]  Subjective/Objective:Provided patient w/xarelto 30 day free trial discount coupon. Patient/spouse voiced understanding.Noted on 02. Will monitor if qualifies for home 02.                    Action/Plan:d/c plan home.   Expected Discharge Date:   (unknown)               Expected Discharge Plan:  Home/Self Care  In-House Referral:     Discharge planning Services  CM Consult  Post Acute Care Choice:    Choice offered to:     DME Arranged:    DME Agency:     HH Arranged:    HH Agency:     Status of Service:  In process, will continue to follow  If discussed at Long Length of Stay Meetings, dates discussed:    Additional Comments:  Dessa Phi, RN 08/12/2015, 2:07 PM

## 2015-08-12 NOTE — Progress Notes (Signed)
ANTICOAGULATION CONSULT NOTE - f/u Consult  Pharmacy Consult for Heparin Indication: pulmonary embolus  No Known Allergies  Patient Measurements: Height: 5\' 11"  (180.3 cm) Weight: 183 lb 6.8 oz (83.2 kg) IBW/kg (Calculated) : 75.3 HEPARIN DW (KG): 83.2   Vital Signs: Temp: 100.7 F (38.2 C) (07/31 2028) Temp Source: Oral (07/31 2028) BP: 155/101 (07/31 2028) Pulse Rate: 127 (07/31 2028)  Labs:  Recent Labs  08/11/15 1611 08/11/15 1631 08/11/15 1824 08/12/15 0019  HGB 11.7*  --   --   --   HCT 35.8*  --   --   --   PLT 272  --  240  --   APTT  --   --  177*  --   HEPARINUNFRC  --   --   --  0.48  CREATININE 1.31* 1.20  --   --     Estimated Creatinine Clearance: 67.1 mL/min (by C-G formula based on SCr of 1.2 mg/dL).   Medical History: Past Medical History:  Diagnosis Date  . Crohn's colitis ([place])    history of due to surgery  . Decreased range of motion (ROM) of shoulder    "both shoulders; never had a rotator cuff tear; at some point in my life I've sheared both muscles off"  . Eczema   . Fecal urgency   . Frequent bowel movements    06-28-13  extreme urgency with bowel movements "3-5" per day or more  . GERD (gastroesophageal reflux disease)   . History of gout   . Hypertension   . Iron deficiency anemia   . Osteoarthritis   . Pneumonia ~ 2007  . PONV (postoperative nausea and vomiting)    "with ether"  . Rheumatoid arthritis ([place])   . Rotator cuff tear    left - limited motion of arm    Medications:  Infusions:  . sodium chloride 50 mL/hr at 08/11/15 2116  . heparin 1,400 Units/hr (08/11/15 1808)    Assessment: [name] with +PE to start on IV heparin.  CBC reviewed- no bleeding noted.   Today, 8/1 0019 HL=0.48 (at goal) , no infusion or bleeding problems per RN   Goal of Therapy:  Heparin level 0.3-0.7 units/ml Monitor platelets by anticoagulation protocol: Yes   Plan:  Continue heparin drip at 1400 units/hr Recheck HL at noon today  and daily while on heparin Continue to monitor H&H and platelets   Frank Dodson R 08/12/2015,12:53 AM

## 2015-08-12 NOTE — Progress Notes (Signed)
*  PRELIMINARY RESULTS* Echocardiogram 2D Echocardiogram has been performed.  Leavy Cella 08/12/2015, 12:42 PM

## 2015-08-12 NOTE — Care Management Note (Signed)
Case Management Note  Patient Details  Name: TREVONTAE GLOSS MRN: [id number] Date of Birth: May 19, 1950  Subjective/Objective:  Benefit checked for xarelto:     S/W MARK @ [facility] SCRIPT # [phone]   XARELTO 20 MG PO BID X 7 DAYS   COVER- YES  CO-PAY- $ 22.73  (Q/L OF ONE PILL PER DAY )  PRIOR APPROVAL - NO   XARELTO 15 MG PO X 7 DAYS   COVER- YES  CO-PAY- $ 45.00 ( UP TO TWO PILLS PER DAYS )  PRIOR APPROVAL - NO   XARELTO 10 MG    COVER- YES  CO-PAY $ 22.73  ( ONE PILL PER DAY )  PRIOR APPROVAL -NO   [facility]Y : [facility], [facility], [facility]              Action/Plan:d/c plan home.   Expected Discharge Date:   (unknown)               Expected Discharge Plan:     In-House Referral:     Discharge planning Services  CM Consult  Post Acute Care Choice:    Choice offered to:     DME Arranged:    DME Agency:     HH Arranged:    HH Agency:     Status of Service:     If discussed at [employer], dates discussed:    Additional Comments:  Dessa Phi, RN 08/12/2015, 12:13 PM

## 2015-08-13 ENCOUNTER — Encounter (HOSPITAL_COMMUNITY)
Admission: EM | Disposition: A | Discharge status: Home / Self Care | Payer: Self-pay | Source: Home / Self Care | Attending: Internal Medicine

## 2015-08-13 DIAGNOSIS — K920 Hematemesis: Secondary | ICD-10-CM

## 2015-08-13 DIAGNOSIS — R509 Fever, unspecified: Secondary | ICD-10-CM

## 2015-08-13 DIAGNOSIS — K921 Melena: Secondary | ICD-10-CM

## 2015-08-13 DIAGNOSIS — I2609 Other pulmonary embolism with acute cor pulmonale: Secondary | ICD-10-CM

## 2015-08-13 HISTORY — PX: PERIPHERAL VASCULAR CATHETERIZATION: SHX172C

## 2015-08-13 LAB — CBC
HCT: 30.5 % — ABNORMAL LOW (ref 39.0–52.0)
HEMATOCRIT: 29.2 % — AB (ref 39.0–52.0)
Hemoglobin: 10.3 g/dL — ABNORMAL LOW (ref 13.0–17.0)
Hemoglobin: 10 g/dL — ABNORMAL LOW (ref 13.0–17.0)
MCH: 32.1 pg (ref 26.0–34.0)
MCH: 32.2 pg (ref 26.0–34.0)
MCHC: 33.8 g/dL (ref 30.0–36.0)
MCHC: 34.2 g/dL (ref 30.0–36.0)
MCV: 95 fL (ref 78.0–100.0)
MCV: 93.9 fL (ref 78.0–100.0)
PLATELETS: 230 10*3/uL (ref 150–400)
Platelets: 250 10*3/uL (ref 150–400)
RBC: 3.21 MIL/uL — ABNORMAL LOW (ref 4.22–5.81)
RBC: 3.11 MIL/uL — ABNORMAL LOW (ref 4.22–5.81)
RDW: 14 % (ref 11.5–15.5)
RDW: 14 % (ref 11.5–15.5)
WBC: 9 10*3/uL (ref 4.0–10.5)
WBC: 9.1 10*3/uL (ref 4.0–10.5)

## 2015-08-13 LAB — OCCULT BLOOD GASTRIC / DUODENUM (SPECIMEN CUP)
Occult Blood, Gastric: POSITIVE — AB
pH, Gastric: 2

## 2015-08-13 LAB — PROTIME-INR
INR: 1.07
PROTHROMBIN TIME: 14 s (ref 11.4–15.2)

## 2015-08-13 LAB — PROTEIN C, TOTAL: Protein C, Total: 105 % (ref 60–150)

## 2015-08-13 LAB — HOMOCYSTEINE: HOMOCYSTEINE-NORM: 10 umol/L (ref 0.0–15.0)

## 2015-08-13 LAB — HEPARIN LEVEL (UNFRACTIONATED): Heparin Unfractionated: 0.14 IU/mL — ABNORMAL LOW (ref 0.30–0.70)

## 2015-08-13 SURGERY — IVC FILTER INSERTION
Anesthesia: LOCAL

## 2015-08-13 MED ORDER — IODIXANOL 320 MG/ML IV SOLN
INTRAVENOUS | Status: DC | PRN
  Administered 2015-08-13: 100 mL via INTRAVENOUS

## 2015-08-13 MED ORDER — FENTANYL CITRATE (PF) 100 MCG/2ML IJ SOLN
INTRAMUSCULAR | Status: DC | PRN
  Administered 2015-08-13: 50 ug via INTRAVENOUS

## 2015-08-13 MED ORDER — SODIUM CHLORIDE 0.9 % IV SOLN
INTRAVENOUS | Status: DC
  Administered 2015-08-13: 19:00:00 via INTRAVENOUS

## 2015-08-13 MED ORDER — FENTANYL CITRATE (PF) 100 MCG/2ML IJ SOLN
INTRAMUSCULAR | Status: AC
  Filled 2015-08-13: qty 2

## 2015-08-13 MED ORDER — LIDOCAINE HCL (PF) 1 % IJ SOLN
INTRAMUSCULAR | Status: DC | PRN
  Administered 2015-08-13: 10 mL via SUBCUTANEOUS

## 2015-08-13 MED ORDER — HYDRALAZINE HCL 20 MG/ML IJ SOLN: 5.0000 mg | INTRAMUSCULAR | Status: DC | PRN

## 2015-08-13 MED ORDER — LIDOCAINE HCL (PF) 1 % IJ SOLN
INTRAMUSCULAR | Status: AC
  Filled 2015-08-13: qty 30

## 2015-08-13 MED ORDER — HEPARIN (PORCINE) IN NACL 2-0.9 UNIT/ML-% IJ SOLN
INTRAMUSCULAR | Status: DC | PRN
Start: 2015-08-13 — End: 2015-08-13
  Administered 2015-08-13: 500 mL

## 2015-08-13 MED ORDER — SODIUM CHLORIDE 0.9 % IV SOLN
INTRAVENOUS | Status: DC
  Administered 2015-08-13 – 2015-08-14 (×2): via INTRAVENOUS

## 2015-08-13 MED ORDER — MIDAZOLAM HCL 2 MG/2ML IJ SOLN
INTRAMUSCULAR | Status: AC
  Filled 2015-08-13: qty 2

## 2015-08-13 MED ORDER — SODIUM CHLORIDE 0.9 % IV SOLN
80.0000 mg | Freq: Once | INTRAVENOUS | Status: AC
  Administered 2015-08-13: 80 mg via INTRAVENOUS
  Filled 2015-08-13: qty 80

## 2015-08-13 MED ORDER — SODIUM CHLORIDE 0.9 % IV SOLN
8.0000 mg/h | INTRAVENOUS | Status: DC
  Administered 2015-08-13 – 2015-08-15 (×5): 8 mg/h via INTRAVENOUS
  Filled 2015-08-13 (×10): qty 80

## 2015-08-13 MED ORDER — PROMETHAZINE HCL 25 MG/ML IJ SOLN
12.5000 mg | Freq: Once | INTRAMUSCULAR | Status: AC
  Administered 2015-08-13: 12.5 mg via INTRAVENOUS
  Filled 2015-08-13: qty 1

## 2015-08-13 MED ORDER — PANTOPRAZOLE SODIUM 40 MG PO TBEC: 40.0000 mg | DELAYED_RELEASE_TABLET | Freq: Every day | ORAL | Status: DC

## 2015-08-13 MED ORDER — PHENOL 1.4 % MT LIQD
1.0000 | OROMUCOSAL | Status: DC | PRN
  Filled 2015-08-13: qty 177

## 2015-08-13 MED ORDER — MIDAZOLAM HCL 2 MG/2ML IJ SOLN
INTRAMUSCULAR | Status: DC | PRN
Start: 2015-08-13 — End: 2015-08-13
  Administered 2015-08-13: 1 mg via INTRAVENOUS

## 2015-08-13 MED ORDER — HEPARIN (PORCINE) IN NACL 2-0.9 UNIT/ML-% IJ SOLN
INTRAMUSCULAR | Status: AC
  Filled 2015-08-13: qty 500

## 2015-08-13 MED ORDER — PANTOPRAZOLE SODIUM 40 MG IV SOLR: 40.0000 mg | Freq: Two times a day (BID) | INTRAVENOUS | Status: DC

## 2015-08-13 MED ORDER — SODIUM CHLORIDE 0.9 % IV BOLUS (SEPSIS)
1000.0000 mL | Freq: Once | INTRAVENOUS | Status: AC
  Administered 2015-08-13: 1000 mL via INTRAVENOUS

## 2015-08-13 SURGICAL SUPPLY — 6 items
COVER PRB 48X5XTLSCP FOLD TPE (BAG) IMPLANT
COVER PROBE 5X48 (BAG) ×2
FILTER CELECT-JUGULAR (Filter) ×1 IMPLANT
KIT PV (KITS) ×2 IMPLANT
TRAY PV CATH (CUSTOM PROCEDURE TRAY) ×2 IMPLANT
WIRE BENTSON .035X145CM (WIRE) ×1 IMPLANT

## 2015-08-13 NOTE — Procedures (Signed)
    OPERATIVE NOTE   PROCEDURE: 1. right internal jugular vein vein cannulation with Ultrasound guidance Inferior vena cavagram Placement of inferior vena cava filter  PRE-OPERATIVE DIAGNOSIS: pulmonary embolism with gi bleed  POST-OPERATIVE DIAGNOSIS: same as above   SURGEON: Zaven Klemens C. Donzetta Matters, MD  ANESTHESIA: moderate sedation and local  ESTIMATED BLOOD LOSS: 5cc cc  FINDING(S): 1. Patent ivc with left renal vein at L2  SPECIMEN(S):  none  INDICATIONS:   Frank Dodson is a 65 y.o. male who presents with pe and gi bleed.  Inferior vena cava filter placement was indicated.  I discussed with the patient risk, benefits, and alternatives to inferior vena caval filter placement.  The risks include but are not limited to: bleeding, infection, possible injury of the inferior vena cava, injury of structures adjacent to the filter including bowel, migration, fracture, continued pulmonary embolization, and caval thrombosis.  The patient acknowledges the risks and agrees to proceed.  DESCRIPTION: After obtain full informed written consent, the patient was brought back to the angiography suite and placed supine upon the angiogram table.  After obtaining adequate anesthesia, the patient was prepped and draped in the standard fashion for: inferior vena cava filter placement via a right jugular approach.  The right internal jugular vein vein was cannulated with an 18-gauge needle after giving local anesthesia to the cannulation site.  A Bentson wire was passed into the inferior vena cava.  The skin and subcutaneous tract was dilated with a 10-Fr dilator.  The [facility] IVC placement sheath and docked dilator was advanced as a unit over the wire into the inferior vena cava, landing the marked dilator at the level of L3. Cavagram was performed.  The lowest renal vein was at L2.   The dilator and sheath were reposition to be in an infrarenal position.  The dilator was removed and the Celect filter was loaded  through the sheath.  The position of the filter was examined under fluoroscopy before docking the filter delivery device onto the sheath.  The safety button was depressed and then the release button was pushed to detach the filter from the delivery device.  Under fluoroscopy, I verified the filter was detached and slowly removed the delivery device.  The sheath was removed and pressure held to the cannulation site for 5 minutes.  A sterile bandage was applied to the cannulation site.  A single shot fluoroscopic image was completed to verify position of the inferior vena cava filter.  The long term plan for this inferior vena cava filter is retrieval if gi bleed resolves and patient can be anticoagulated.  COMPLICATIONS: none  CONDITION: good  Frank Dodson C. Donzetta Matters, MD [facility] [place] Office: [phone] Pager: [phone]  08/13/2015, 3:40 PM

## 2015-08-13 NOTE — Care Management Note (Signed)
Case Management Note  Patient Details  Name: Frank Dodson MRN: [id number] Date of Birth: [birth year]  Subjective/Objective: Acute to acute transfer to [facility]-vascular surgery-IVC filter placement via [facility].                  Action/Plan:Acute to acute transfer.   Expected Discharge Date:   (unknown)               Expected Discharge Plan:  Home/Self Care  In-House Referral:     Discharge planning Services  CM Consult  Post Acute Care Choice:    Choice offered to:     DME Arranged:    DME Agency:     HH Arranged:    [place] Agency:     Status of Service:  Completed, signed off  If discussed at [employer] of Stay Meetings, dates discussed:    Additional Comments:  Dessa Phi, RN 08/13/2015, 10:20 AM

## 2015-08-13 NOTE — Progress Notes (Signed)
ANTICOAGULATION CONSULT NOTE - f/u Consult  Pharmacy Consult for Heparin Indication: pulmonary embolus  No Known Allergies  Patient Measurements: Height: 5\' 11"  (180.3 cm) Weight: 183 lb 6.8 oz (83.2 kg) IBW/kg (Calculated) : 75.3 HEPARIN DW (KG): 83.2   Vital Signs: Temp: 100.7 F (38.2 C) (08/02 0432) Temp Source: Oral (08/02 0432) BP: 140/77 (08/02 0432) Pulse Rate: 120 (08/02 0432)  Labs:  Recent Labs  08/11/15 1611 08/11/15 1631 08/11/15 1824 08/12/15 0019 08/12/15 0640 08/12/15 1154 08/13/15 0536  HGB 11.7*  --   --   --  10.8*  --  10.3*  HCT 35.8*  --   --   --  31.9*  --  30.5*  PLT 272  --  240  --  224  --  250  APTT  --   --  177*  --   --   --   --   LABPROT  --   --   --   --  14.7  --   --   INR  --   --   --   --  1.14  --   --   HEPARINUNFRC  --   --   --  0.48  --  0.34 0.14*  CREATININE 1.31* 1.20  --   --  1.24  --   --     Estimated Creatinine Clearance: 64.9 mL/min (by C-G formula based on SCr of 1.24 mg/dL).   Medical History: Past Medical History:  Diagnosis Date  . Crohn's colitis ([place])    history of due to surgery  . Decreased range of motion (ROM) of shoulder    "both shoulders; never had a rotator cuff tear; at some point in my life I've sheared both muscles off"  . Eczema   . Fecal urgency   . Frequent bowel movements    06-28-13  extreme urgency with bowel movements "3-5" per day or more  . GERD (gastroesophageal reflux disease)   . History of gout   . Hypertension   . Iron deficiency anemia   . Osteoarthritis   . Pneumonia ~ 2007  . PONV (postoperative nausea and vomiting)    "with ether"  . Rheumatoid arthritis ([place])   . Rotator cuff tear    left - limited motion of arm    Medications:  Infusions:  . heparin 1,400 Units/hr (08/13/15 0202)    Assessment: [name] with +PE to start on IV heparin.  CBC reviewed.  No bleeding noted reported.  8/1: 0019 HL=0.48 (at goal) , no infusion or bleeding problems per RN   1200 HL= 0.34 confirmatory  Today, 8/2: Alert, conversant.  Vomiting into basin.  RN reports intermittent vomiting much of the night.  RN states vomitus appears dark but no frank blood visualized so far.    Normotensive but tachycardic. IV Heparin infusing at 1400 units/hr.  RN states this has not been interrupted overnight.  HL 0.14 (subtherapeutic) Hgb slightly low but >10 and minimal change from yesterday Pltc WNL   Goal of Therapy:  Heparin level 0.3-0.7 units/ml Monitor platelets by anticoagulation protocol: Yes   Plan:  1. Increase heparin to 1750 units/hr. 2. Recheck heparin level at 12:30 pm. 3. Follow for any reports of bleeding. 4. Follow daily HL, CBC.  Clayburn Pert, PharmD, BCPS Pager: [phone] 08/13/2015  6:31 AM

## 2015-08-13 NOTE — Progress Notes (Signed)
Report called to Lesly Rubenstein, RN at [facility].  [facility] notified for transport. Andre Lefort

## 2015-08-13 NOTE — Progress Notes (Signed)
Pt having coffee ground emesis and stools.  Zofran given.  [facility] notified. Andre Lefort

## 2015-08-13 NOTE — Progress Notes (Signed)
Taking over care of patient, agree with previous RN assessment. Upon entering room to introduce myself, writer noticed large amount of black liquid on floor and in basin at the bedside. Patient stated,  "can you help me I split my bucket that I threw up in." NP on call notified of findings and Patient complaint of 5/10 abdominal pain in the LLQ as well as still feeling nauseous. New orders given. Will continue to monitor.

## 2015-08-13 NOTE — Progress Notes (Signed)
Patient currently in Cath Lab case discussed with the hospital team and have tentatively set him up for an endoscopy tomorrow at roughly 9:30 and will discuss with him in the morning but please call me if any question or problem sooner

## 2015-08-13 NOTE — Progress Notes (Signed)
Pt transported to [facility] via [facility]. Andre Lefort

## 2015-08-13 NOTE — Consult Note (Signed)
Hospital Consult    Reason for Consult:  ivc filter placement Referring Physician:  Marguarite Arbour, MD MRN #:  [id number]  History of Present Illness: This is a 65 y.o. male with h/o crohns disease s/p colon resection now admitted to [facility] with shortness of breath and chest pain and found to have Pe. No duplex of lower extremities performed. Currently not short of breath on 4L [place]. Denies lower extremity edema. Had multiple episodes of dark emesis yesterday now off heparin gtt and transferred here for ivc filter placement.  Past Medical History:  Diagnosis Date  . Crohn's colitis ([place])    history of due to surgery  . Decreased range of motion (ROM) of shoulder    "both shoulders; never had a rotator cuff tear; at some point in my life I've sheared both muscles off"  . Eczema   . Fecal urgency   . Frequent bowel movements    06-28-13  extreme urgency with bowel movements "3-5" per day or more  . GERD (gastroesophageal reflux disease)   . History of gout   . Hypertension   . Iron deficiency anemia   . Osteoarthritis   . Pneumonia ~ 2007  . PONV (postoperative nausea and vomiting)    "with ether"  . Rheumatoid arthritis ([place])   . Rotator cuff tear    left - limited motion of arm    Past Surgical History:  Procedure Laterality Date  . COLECTOMY    . COLONOSCOPY    . CYSTOSCOPY  ~ 2013   "clean out of uric acid deposits"  . ENTEROSTOMY CLOSURE  11/2014  . ESOPHAGOGASTRODUODENOSCOPY    . JOINT REPLACEMENT    . LAPAROSCOPIC PARTIAL PROTECTOMY  10/2014   with J pouch  . REVERSE SHOULDER ARTHROPLASTY Left 05/15/2015  . REVERSE SHOULDER ARTHROPLASTY Left 05/15/2015   Procedure: LEFT REVERSE SHOULDER ARTHROPLASTY;  Surgeon: Justice Britain, MD;  Location: [facility];  Service: Orthopedics;  Laterality: Left;  . 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 TOTAL KNEE ARTHROPLASTY Right 09/04/2013   Procedure: RIGHT TOTAL KNEE ARTHROPLASTY;   Surgeon: Mauri Pole, MD;  Location: [facility];  Service: Orthopedics;  Laterality: Right;  . TOTAL KNEE ARTHROPLASTY Left 2011    No Known Allergies  Prior to Admission medications   Medication Sig Start Date End Date Taking? Authorizing Provider  acetaminophen (TYLENOL) 650 MG CR tablet Take 1,300 mg by mouth every 8 (eight) hours as needed for pain.   Yes Historical Provider, MD  allopurinol (ZYLOPRIM) 100 MG tablet Take 100 mg by mouth 2 (two) times daily.    Yes Historical Provider, MD  doxazosin (CARDURA) 4 MG tablet Take 4 mg by mouth every evening.   Yes Historical Provider, MD  ferrous fumarate (HEMOCYTE - 106 MG FE) 325 (106 FE) MG TABS tablet Take 1 tablet by mouth 2 (two) times daily.   Yes Historical Provider, MD  hydrOXYzine (ATARAX/VISTARIL) 25 MG tablet Take 50 mg by mouth daily.    Yes Historical Provider, MD  loperamide (IMODIUM A-D) 2 MG tablet Take 4 mg by mouth 4 (four) times daily as needed for diarrhea or loose stools.    Yes Historical Provider, MD  magnesium gluconate (MAGONATE) 500 MG tablet Take 500 mg by mouth daily.   Yes Historical Provider, MD  Multiple Vitamin (MULTIVITAMIN WITH MINERALS) TABS tablet Take 1 tablet by mouth daily.   Yes Historical Provider, MD  pantoprazole (  PROTONIX) 40 MG tablet Take 40 mg by mouth daily.   Yes Historical Provider, MD  predniSONE (DELTASONE) 5 MG tablet Take 5 mg by mouth daily.    Yes Historical Provider, MD  quinapril (ACCUPRIL) 40 MG tablet Take 40 mg by mouth 2 (two) times daily.    Yes Historical Provider, MD  traMADol (ULTRAM) 50 MG tablet Take 100 mg by mouth 2 (two) times daily.   Yes Historical Provider, MD  vitamin C (ASCORBIC ACID) 500 MG tablet Take 500 mg by mouth daily.   Yes Historical Provider, MD  zolpidem (AMBIEN) 10 MG tablet Take 10 mg by mouth at bedtime.    Yes Historical Provider, MD    Social History   Social History  . Marital status: Married    Spouse name: N/A  . Number of children: N/A  .  Years of education: N/A   Occupational History  . Not on file.   Social History Main Topics  . Smoking status: Never Smoker  . Smokeless tobacco: Never Used  . Alcohol use Yes     Comment: 05/15/2015 "might have 6 beers/month"  . Drug use: No  . Sexual activity: Yes   Other Topics Concern  . Not on file   Social History Narrative  . No narrative on file     Family History  Problem Relation Age of Onset  . Subarachnoid hemorrhage Father   . Diabetes Mellitus II Father     ROS:   Cardiovascular: + chest pain/pressure + SOB lying flat since Monday no hx of DVT noswelling in legs  Pulmonary: Denies home O2  Neurologic: No hx of CVA or mini stroke  Hematologic: no hx of cancer no bleeding problems no problems with blood clotting easily  Endocrine: No diabetes  GI + vomiting blood yesterday + blood in stool per testing  GU: denies blood in urine  Constitutional: Denies fevers, chills or weight loss   Physical Examination  Vitals:   08/13/15 1245 08/13/15 1351  BP: 133/82 (!) 144/75  Pulse: (!) 117 95  Resp: 20 20  Temp: 98.3 F (36.8 C) 99.5 F (37.5 C)   Body mass index is 26.11 kg/m.  General:  WDWN in NAD Gait: Not observed HENT: WNL, normocephalic Pulmonary: non labored on 4 liters  Cardiac: tachycardic, regular  Abdomen: soft, ntnd  Extremities: no edema or ttp ble Musculoskeletal: no muscle wasting or atrophy  Neurologic: A&O X 3; Appropriate Affect ; SENSATION: normal; MOTOR FUNCTION:  moving all extremities equally. Speech is fluent/normal   CBC    Component Value Date/Time   WBC 9.1 08/13/2015 1023   RBC 3.11 (L) 08/13/2015 1023   HGB 10.0 (L) 08/13/2015 1023   HCT 29.2 (L) 08/13/2015 1023   PLT 230 08/13/2015 1023   MCV 93.9 08/13/2015 1023   MCH 32.2 08/13/2015 1023   MCHC 34.2 08/13/2015 1023   RDW 14.0 08/13/2015 1023   LYMPHSABS 2.9 08/11/2015 1611   MONOABS 1.3 (H) 08/11/2015 1611   EOSABS 0.1 08/11/2015 1611     BASOSABS 0.0 08/11/2015 1611    BMET    Component Value Date/Time   NA 138 08/12/2015 0640   K 4.2 08/12/2015 0640   CL 101 08/12/2015 0640   CO2 27 08/12/2015 0640   GLUCOSE 131 (H) 08/12/2015 0640   BUN 24 (H) 08/12/2015 0640   CREATININE 1.24 08/12/2015 0640   CALCIUM 8.7 (L) 08/12/2015 0640   GFRNONAA >60 08/12/2015 0640   GFRAA >60 08/12/2015 [id number]  COAGS: Lab Results  Component Value Date   INR 1.07 08/13/2015   INR 1.14 08/12/2015   INR 1.04 08/23/2013    CT CHEST IMPRESSION  1. Bilateral pulmonary emboli, most prominently in the left lower lobe. There is left greater than right lower lobe airspace opacity that is likely atelectasis. Early pulmonary infarction is not excluded. 2. Essentially normal thoracoabdominal aorta.  No dissection.  CT ABDOMEN AND PELVIS IMPRESSION  1. Normal appearance of the abdominal aorta. No dissection. Branch vessels are widely patent. 2. Nonocclusive thrombus noted in the superior mesenteric vein. 3. No other acute findings in the abdomen or pelvis.    ASSESSMENT/PLAN: This is a 65 y.o. male with PE and concern for UGI bleed now off anticoagulation with plan for egd in near future.  -ivc filter to be placed today and will follow for removal when patient can be anticoagulated -lower extremity duplex to assess source   Kate Larock C. Donzetta Matters, MD [facility] [place] Office: [phone] Pager: [phone]

## 2015-08-13 NOTE — Progress Notes (Signed)
[facility] PROGRESS NOTE    Progress Note  [name]  [id number] DOB: 04-15-1950 DOA: 08/11/2015 PCP: Gerrit Heck, MD     Brief Narrative:   Frank Dodson is an 65 y.o. male past medical history of Crohn's disease on steroids essential hypertension that comes in for sudden onset of severe chest pressure radiating to his left lower abdomen that began on the day of admission CT scan was done that shows large acute pulmonary emboli Started on heparin with coffee-ground hematemesis and melanotic stools this morning.  Assessment/Plan:   Acute pulmonary embolism (HCC)/New coffee-ground emesis and melanotic stools Hypercoagulable panel is pending, his heart rate has improved. He started having coffee-ground emesis with black stools this morning. FOBT positive. DC IV heparin and start him on Protonix drip. Consulted vascular surgery (Dr. Oneida Alar) for IVC filter placement, who requested transfer to [facility] for IVC filter placement. Check CBCs every 12 hours we'll give him a liter fluid of normal saline. We'll consult GI for endoscopy. Placed him NPO, keep him on telemetry place an additional peripheral IV line. Blood pressure has remained stable and unchanged from previous. ECHO showed NL EF grade 1 diastolic heart failure.  New Fever: His leukocytosis is improved, he denies any cough shortness of breath burning when he urinates. No culture sores. This could be probably due to his PE as there was some pulmonary infarct seen on CT on admission. He has 4-8 watery stools every day this is not new. Due to his history of Crohn's. Very unlikely to be C. difficile.  Watery stools due to Crohn's disease: I will not check him for C. difficile at this time we'll continue Imodium 4 times a day. She has 48 bowel movements a day is normal for him after his colectomy    DVT prophylaxis: heparin Family Communication:Wife Disposition Plan/Barrier to D/C: unable to determine Code  Status:     Code Status Orders        Start     Ordered   08/11/15 2011  Full code  Continuous     08/11/15 2010    Code Status History    Date Active Date Inactive Code Status Order ID Comments User Context   09/04/2013  7:59 PM 09/05/2013  5:12 PM Full Code [id number]  Lucille Passy Babish, PA-C Inpatient        IV Access:    Peripheral IV   Procedures and diagnostic studies:   Ct Angio Chest/abd/pel For Dissection W And/or Wo Contrast  Result Date: 08/11/2015 CLINICAL DATA:  Pt began to have central CP yesterday. Today while at lunch pain began to get worse suddenly. Accompanied by SOB. Pain radiates to L side of abd. EXAM: CT ANGIO CHEST-ABD-PELV FOR DISSECTION W/ AND WO/W CM<Procedure Description>CT ANGIO CHEST-ABD-PELV FOR DISSECTION W/ AND WO/W CM TECHNIQUE: Unenhanced CT imaging of the chest was performed. CT imaging was then acquired through the chest, abdomen and pelvis following the dynamic injection of 100 mL of Isovue 370 intravenous contrast. Computer-generated coronal and sagittal MIP and MPR images were also obtained. CONTRAST:  100 mL of Isovue 370 intravenous contrast. COMPARISON:  Chest radiograph, 06/28/2013. Abdomen and pelvis CT, 03/23/2012. None. FINDINGS: CT CHEST ANGIOGRAPHIC FINDINGS: Thoracic aorta is normal in caliber. There is minor atherosclerotic calcifications along the arch and at the origin of the left subclavian artery and in the innominate artery. a there are bilateral pulmonary emboli. On the left, near occlusive embolus is seen in the lower lobe pulmonary artery extending into  segmental branches. On the right, new pulmonary embolus straddles the right middle lobe and lower lobe pulmonary arteries. Segmental branch emboli are noted in the right lower lobe and right middle lobe. Possible emboli are noted in the right upper lobe segmental and subsegmental branches. NON ANGIOGRAPHIC FINDINGS: Neck base and axilla:  No mass or adenopathy. Mediastinum and  hila: Heart normal in size and configuration. No significant coronary artery calcifications. No mediastinal or hilar masses or enlarged lymph nodes. Lungs and pleura: Patchy airspace opacity is noted in the lower lobes, left greater than right. Mild prominence of the vascular markings are noted bilaterally. Minimal left pleural effusion. No pneumothorax. CT ABDOMEN PELVIS ANGIOGRAPHIC FINDINGS: The abdominal aorta is normal in caliber. No dissection. No atherosclerotic plaque. The celiac axis, superior mesenteric artery single bilateral renal arteries and the inferior mesenteric artery are widely patent. There is minor partly calcified plaque along the left common iliac artery and at the bifurcation of the right common iliac artery and in both internal iliac arteries. Plaque is noted in the common femoral arteries, greater on the right. No significant stenosis. There is nonocclusive thrombus in the superior mesenteric vein centrally. NON ANGIOGRAPHIC FINDINGS: Liver, spleen, gallbladder, pancreas, adrenal glands:  Unremarkable. Kidneys, ureters, bladder: Mild renal cortical thinning. Nonobstructing stone and in the lower pole the right kidney. No renal masses. No hydronephrosis. Normal ureters. Normal bladder. Lymph nodes:  No adenopathy. Gastrointestinal: Small hiatal hernia. Stomach otherwise unremarkable. Normal small bowel. There is a rectosigmoid anastomosis staple line. Another staple line is noted involving the right colon. There is no colon dilation, wall thickening or adjacent inflammation. MUSCULOSKELETAL FINDINGS: There are degenerative changes of the spine mostly the lumbar spine. Bones are demineralized. There are no osteoblastic or osteolytic lesions. IMPRESSION: CT CHEST IMPRESSION 1. Bilateral pulmonary emboli, most prominently in the left lower lobe. There is left greater than right lower lobe airspace opacity that is likely atelectasis. Early pulmonary infarction is not excluded. 2. Essentially  normal thoracoabdominal aorta.  No dissection. CT ABDOMEN AND PELVIS IMPRESSION 1. Normal appearance of the abdominal aorta. No dissection. Branch vessels are widely patent. 2. Nonocclusive thrombus noted in the superior mesenteric vein. 3. No other acute findings in the abdomen or pelvis. Electronically Signed   By: Lajean Manes M.D.   On: 08/11/2015 17:10     Medical Consultants:    None.  Anti-Infectives:   none  Subjective:    Frank Dodson pain is control, But he started having coffee-ground emesis and melanotic stool this morning. He continues to have mild epigastric pain.  Objective:    Vitals:   08/12/15 2242 08/12/15 2352 08/13/15 0432 08/13/15 0443  BP: (!) 139/100  140/77   Pulse: (!) 134  (!) 120   Resp: (!) 22 (!) 24 (!) 24 18  Temp: (!) 100.8 F (38.2 C)  (!) 100.7 F (38.2 C)   TempSrc: Oral  Oral   SpO2: 91% 92% 94% 98%  Weight:      Height:        Intake/Output Summary (Last 24 hours) at 08/13/15 0922 Last data filed at 08/13/15 0700  Gross per 24 hour  Intake          1473.93 ml  Output              202 ml  Net          1271.93 ml   Filed Weights   08/11/15 1611 08/11/15 2028  Weight: 79.4 kg (175 lb)  83.2 kg (183 lb 6.8 oz)    Exam: General exam: In no acute distress. Respiratory system: Good air movement and clear to auscultation. Cardiovascular system: S1 & S2 heard, RRR.  Gastrointestinal system: Abdomen is nondistended, soft and nontender.  Central nervous system: Alert and oriented. No focal neurological deficits. Extremities: No pedal edema. Skin: No rashes, lesions or ulcers Psychiatry: Judgement and insight appear normal. Mood & affect appropriate.    Data Reviewed:    Labs: Basic Metabolic Panel:  Recent Labs Lab 08/11/15 1611 08/11/15 1631 08/12/15 0640  NA 140  --  138  K 3.8  --  4.2  CL 104  --  101  CO2 25  --  27  GLUCOSE 138*  --  131*  BUN 21*  --  24*  CREATININE 1.31* 1.20 1.24  CALCIUM 9.2  --  8.7*    GFR Estimated Creatinine Clearance: 64.9 mL/min (by C-G formula based on SCr of 1.24 mg/dL). Liver Function Tests:  Recent Labs Lab 08/11/15 1611  AST 29  ALT 27  ALKPHOS 68  BILITOT 0.6  PROT 8.3*  ALBUMIN 4.5    Recent Labs Lab 08/11/15 1611  LIPASE 43   No results for input(s): AMMONIA in the last 168 hours. Coagulation profile  Recent Labs Lab 08/12/15 0640 08/13/15 0536  INR 1.14 1.07    CBC:  Recent Labs Lab 08/11/15 1611 08/11/15 1824 08/12/15 0640 08/13/15 0536  WBC 10.8*  --  10.4 9.0  NEUTROABS 5.9  --   --   --   HGB 11.7*  --  10.8* 10.3*  HCT 35.8*  --  31.9* 30.5*  MCV 95.7  --  95.2 95.0  PLT 272 240 224 250   Cardiac Enzymes: No results for input(s): CKTOTAL, CKMB, CKMBINDEX, TROPONINI in the last 168 hours. BNP (last 3 results) No results for input(s): PROBNP in the last 8760 hours. CBG: No results for input(s): GLUCAP in the last 168 hours. D-Dimer:  Recent Labs  08/11/15 1631  DDIMER 5.56*   Hgb A1c: No results for input(s): HGBA1C in the last 72 hours. Lipid Profile: No results for input(s): CHOL, HDL, LDLCALC, TRIG, CHOLHDL, LDLDIRECT in the last 72 hours. Thyroid function studies: No results for input(s): TSH, T4TOTAL, T3FREE, THYROIDAB in the last 72 hours.  Invalid input(s): FREET3 Anemia work up: No results for input(s): VITAMINB12, FOLATE, FERRITIN, TIBC, IRON, RETICCTPCT in the last 72 hours. Sepsis Labs:  Recent Labs Lab 08/11/15 1611 08/11/15 1632 08/12/15 0640 08/13/15 0536  WBC 10.8*  --  10.4 9.0  LATICACIDVEN  --  2.90*  --   --    Microbiology No results found for this or any previous visit (from the past 240 hour(s)).   Medications:   . allopurinol  100 mg Oral BID  . doxazosin  4 mg Oral QPM  . Ferrous Fumarate  1 tablet Oral BID  . HYDROmorphone   Intravenous Q4H  . magnesium gluconate  500 mg Oral Daily  . multivitamin with minerals  1 tablet Oral Daily  . predniSONE  5 mg Oral Daily   . sodium chloride flush  3 mL Intravenous Q12H  . vitamin C  500 mg Oral Daily   Continuous Infusions:    Time spent: 35 min   LOS: 2 days   Charlynne Cousins  [facility] Pager [phone]  *Please refer to [email or web address], password [email or web address] to get updated schedule on who will round on this patient, as hospitalists switch teams weekly.  If 7PM-7AM, please contact night-coverage at [email or web address], password [email or web address] for any overnight needs.  08/13/2015, 9:22 AM

## 2015-08-14 ENCOUNTER — Inpatient Hospital Stay (HOSPITAL_COMMUNITY): Payer: BLUE CROSS/BLUE SHIELD

## 2015-08-14 ENCOUNTER — Inpatient Hospital Stay (HOSPITAL_COMMUNITY): Payer: BLUE CROSS/BLUE SHIELD | Admitting: Certified Registered"

## 2015-08-14 ENCOUNTER — Encounter (HOSPITAL_COMMUNITY)
Admission: EM | Disposition: A | Discharge status: Home / Self Care | Payer: Self-pay | Source: Home / Self Care | Attending: Internal Medicine

## 2015-08-14 ENCOUNTER — Encounter (HOSPITAL_COMMUNITY): Payer: Self-pay | Admitting: Vascular Surgery

## 2015-08-14 DIAGNOSIS — D72829 Elevated white blood cell count, unspecified: Secondary | ICD-10-CM

## 2015-08-14 DIAGNOSIS — I2699 Other pulmonary embolism without acute cor pulmonale: Principal | ICD-10-CM

## 2015-08-14 DIAGNOSIS — K920 Hematemesis: Secondary | ICD-10-CM

## 2015-08-14 DIAGNOSIS — D62 Acute posthemorrhagic anemia: Secondary | ICD-10-CM

## 2015-08-14 HISTORY — PX: ESOPHAGOGASTRODUODENOSCOPY: SHX5428

## 2015-08-14 LAB — CBC
HCT: 28.2 % — ABNORMAL LOW (ref 39.0–52.0)
HEMATOCRIT: 26 % — AB (ref 39.0–52.0)
Hemoglobin: 9.2 g/dL — ABNORMAL LOW (ref 13.0–17.0)
Hemoglobin: 8.4 g/dL — ABNORMAL LOW (ref 13.0–17.0)
MCH: 31.1 pg (ref 26.0–34.0)
MCH: 30.5 pg (ref 26.0–34.0)
MCHC: 32.6 g/dL (ref 30.0–36.0)
MCHC: 32.3 g/dL (ref 30.0–36.0)
MCV: 95.3 fL (ref 78.0–100.0)
MCV: 94.5 fL (ref 78.0–100.0)
PLATELETS: 223 10*3/uL (ref 150–400)
Platelets: 234 10*3/uL (ref 150–400)
RBC: 2.96 MIL/uL — AB (ref 4.22–5.81)
RBC: 2.75 MIL/uL — ABNORMAL LOW (ref 4.22–5.81)
RDW: 13.8 % (ref 11.5–15.5)
RDW: 13.8 % (ref 11.5–15.5)
WBC: 6 10*3/uL (ref 4.0–10.5)
WBC: 4.9 10*3/uL (ref 4.0–10.5)

## 2015-08-14 LAB — C DIFFICILE QUICK SCREEN W PCR REFLEX
C DIFFICLE (CDIFF) ANTIGEN: POSITIVE — AB
C Diff toxin: NEGATIVE

## 2015-08-14 LAB — PROTEIN S ACTIVITY: Protein S Activity: 128 % (ref 63–140)

## 2015-08-14 LAB — PROTEIN C ACTIVITY: PROTEIN C ACTIVITY: 157 % (ref 73–180)

## 2015-08-14 LAB — PROTEIN S, TOTAL: Protein S Ag, Total: 175 % — ABNORMAL HIGH (ref 60–150)

## 2015-08-14 LAB — PROTIME-INR
INR: 1.2
PROTHROMBIN TIME: 15.3 s — AB (ref 11.4–15.2)

## 2015-08-14 LAB — CLOSTRIDIUM DIFFICILE BY PCR: Toxigenic C. Difficile by PCR: POSITIVE — AB

## 2015-08-14 SURGERY — EGD (ESOPHAGOGASTRODUODENOSCOPY)
Anesthesia: Monitor Anesthesia Care

## 2015-08-14 MED ORDER — PROPOFOL 500 MG/50ML IV EMUL
INTRAVENOUS | Status: DC | PRN
  Administered 2015-08-14: 175 ug/kg/min via INTRAVENOUS

## 2015-08-14 MED ORDER — LIDOCAINE HCL (CARDIAC) 20 MG/ML IV SOLN
INTRAVENOUS | Status: DC | PRN
  Administered 2015-08-14: 100 mg via INTRATRACHEAL

## 2015-08-14 MED ORDER — BUTAMBEN-TETRACAINE-BENZOCAINE 2-2-14 % EX AERO
INHALATION_SPRAY | CUTANEOUS | Status: DC | PRN
  Administered 2015-08-14: 1 via TOPICAL
  Administered 2015-08-14: 2 via TOPICAL

## 2015-08-14 MED ORDER — PANTOPRAZOLE SODIUM 40 MG PO TBEC
40.0000 mg | DELAYED_RELEASE_TABLET | Freq: Two times a day (BID) | ORAL | Status: DC
  Administered 2015-08-14 – 2015-08-15 (×2): 40 mg via ORAL
  Filled 2015-08-14 (×2): qty 1

## 2015-08-14 MED ORDER — PROPOFOL 10 MG/ML IV BOLUS
INTRAVENOUS | Status: DC | PRN
  Administered 2015-08-14: 70 mg via INTRAVENOUS

## 2015-08-14 NOTE — Progress Notes (Addendum)
[facility]  Subjective  - Doing well off O2.   Objective (!) 121/50 91 98.2 F (36.8 C) (Oral) 17 96%  Intake/Output Summary (Last 24 hours) at 08/14/15 0716 Last data filed at 08/13/15 2058  Gross per 24 hour  Intake             1100 ml  Output             1100 ml  Net                0 ml    Right neck without hematoma, soft.  Dressing clean and dry. Palpable radial pulses bilaterally Heart sinus tach 110 bpm  Assessment/Planning: POD # Right IJ PVC filter placement  Pending further GI work up today patient is NPO The long term plan for this inferior vena cava filter is retrieval if gi bleed resolves and patient can be anticoagulated.  Laurence Slate [facility] 08/14/2015 7:16 AM --  Laboratory Lab Results:  Recent Labs  08/13/15 0536 08/13/15 1023  WBC 9.0 9.1  HGB 10.3* 10.0*  HCT 30.5* 29.2*  PLT 250 230   BMET  Recent Labs  08/11/15 1611 08/11/15 1631 08/12/15 0640  NA 140  --  138  K 3.8  --  4.2  CL 104  --  101  CO2 25  --  27  GLUCOSE 138*  --  131*  BUN 21*  --  24*  CREATININE 1.31* 1.20 1.24  CALCIUM 9.2  --  8.7*    COAG Lab Results  Component Value Date   INR 1.20 08/14/2015   INR 1.07 08/13/2015   INR 1.14 08/12/2015   No results found for: PTT   Agree with above, no leg swelling or pain and breathing comfortably off O2. Has undergone egd and will need at least 3 months anticoagulation for pe with lower extremity venous duplex pending if cleared per GI.  Will have patient f/u in office for filter removal if [facility] is tolerated with no further vte signs/symptoms.   Wisdom Seybold C. Donzetta Matters, MD [facility] Office: [phone] Pager: [phone]

## 2015-08-14 NOTE — Transfer of Care (Signed)
Immediate Anesthesia Transfer of Care Note  Patient: Frank Dodson  Procedure(s) Performed: Procedure(s): ESOPHAGOGASTRODUODENOSCOPY (EGD) (N/A)  Patient Location: Endoscopy Unit  Anesthesia Type:MAC  Level of Consciousness: awake and patient cooperative  Airway & Oxygen Therapy: Patient Spontanous Breathing and Patient connected to nasal cannula oxygen  Post-op Assessment: Report given to RN and Post -op Vital signs reviewed and stable  Post vital signs: Reviewed and stable  Last Vitals:  Vitals:   08/14/15 0429 08/14/15 0939  BP: (!) 121/50 140/83  Pulse: 91 98  Resp: 17 18  Temp: 36.8 C 37.1 C    Last Pain:  Vitals:   08/14/15 0939  TempSrc: Oral  PainSc:       Patients Stated Pain Goal: 0 ([id number])  Complications: No apparent anesthesia complications

## 2015-08-14 NOTE — Op Note (Signed)
[facility] Patient Name: Frank Dodson Procedure Date : 08/14/2015 MRN: [id number] Attending MD: Clarene Essex , MD Date of Birth: 1950/01/13 CSN: [id number] Age: 65 Admit Type: Inpatient Procedure:                Upper GI endoscopy Indications:              Hematemesis Providers:                Clarene Essex, MD, Sarah Monday RN, RN, William Dalton, Technician Referring MD:              Medicines:                Propofol total dose 200 mg IV, Cetacaine spray                            lidocaine 123XX123 mg IV Complications:            No immediate complications. Estimated Blood Loss:     Estimated blood loss: none. Procedure:                Pre-Anesthesia Assessment:                           - Prior to the procedure, a History and Physical                            was performed, and patient medications and                            allergies were reviewed. The patient's tolerance of                            previous anesthesia was also reviewed. The risks                            and benefits of the procedure and the sedation                            options and risks were discussed with the patient.                            All questions were answered, and informed consent                            was obtained. Prior Anticoagulants: The patient has                            taken heparin, last dose was 1 day prior to                            procedure. ASA Grade Assessment: II - A patient  with mild systemic disease. After reviewing the                            risks and benefits, the patient was deemed in                            satisfactory condition to undergo the procedure.                           After obtaining informed consent, the endoscope was                            passed under direct vision. Throughout the                            procedure, the patient's blood pressure, pulse, and                      oxygen saturations were monitored continuously. The                            [id number]) scope was introduced through the                            mouth, and advanced to the third part of duodenum.                            The upper GI endoscopy was accomplished without                            difficulty. The patient tolerated the procedure                            well. Scope In: Scope Out: Findings:      LA Grade D (one or more mucosal breaks involving at least 75% of       esophageal circumference) esophagitis with friability was found.      A medium-sized hiatal hernia was present.      The entire examined stomach was normal.      The duodenal bulb, first portion of the duodenum, second portion of the       duodenum and third portion of the duodenum were normal.      The exam was otherwise without abnormality. Impression:               - LA Grade D reflux esophagitis.                           - Medium-sized hiatal hernia.                           - Normal stomach.                           - Normal duodenal bulb, first portion of the  duodenum, second portion of the duodenum and third                            portion of the duodenum.                           - The examination was otherwise normal.                           - No specimens collected. Moderate Sedation:      moderate sedation-none Recommendation:           - Patient has a contact number available for                            emergencies. The signs and symptoms of potential                            delayed complications were discussed with the                            patient. Return to normal activities tomorrow.                            Written discharge instructions were provided to the                            patient.                           - Soft diet today. repeat endoscopy in 2 months to                            document  healing or for biopsy                           - Continue present medications. twice a day pump                            inhibitors long-term and please resume home dose of                            Imodium or Lomotil for his diarrhea okay with me to                            use blood thinners and might expect a little                            hematemesis based on the degree of esophagitis but                            doubt that bleeding would be life-threatening                           - Return to GI clinic  in 4 weeks.                           - Telephone GI clinic if symptomatic PRN. Procedure Code(s):        --- Professional ---                           [phone], Esophagogastroduodenoscopy, flexible,                            transoral; diagnostic, including collection of                            specimen(s) by brushing or washing, when performed                            (separate procedure) Diagnosis Code(s):        --- Professional ---                           K21.0, Gastro-esophageal reflux disease with                            esophagitis                           K44.9, Diaphragmatic hernia without obstruction or                            gangrene                           K92.0, Hematemesis CPT copyright 2016 [employer]. All rights reserved. The codes documented in this report are preliminary and upon coder review may  be revised to meet current compliance requirements. Clarene Essex, MD 08/14/2015 11:24:27 AM This report has been signed electronically. Number of Addenda: 0

## 2015-08-14 NOTE — Progress Notes (Addendum)
Patient ID: Frank Dodson, male   DOB: 02-02-1950, 65 y.o.   MRN: [id number]  PROGRESS NOTE    GERALDO QUEIROZ  [id number] DOB: 1950/03/06 DOA: 08/11/2015  PCP: Gerrit Heck, MD   Brief Narrative:  65 y.o. male past medical history of Crohn's disease on steroids, essential hypertension who presented to [facility] with severe chest pain pressure radiating to left lower abdomen started on the day of the admission. Patient was subsequently found to have large acute bilateral pulmonary emboli. He was subsequently started on heparin drip but developed coffee-ground hematemesis and melanotic stools. He underwent IVC filter placement 08/13/2015. He has been seen by GI in consultation who plans on doing endoscopy today.  Assessment & Plan:  Acute bilateral pulmonary emboli (HCC) / left peroneal vein DVT - Unclear etiology. Hypercoagulable panel pending although this should probably be done in outpatient setting as patient was already on heparin and results may not be accurate - CT angiogram of the chest showed bilateral pulmonary emboli and lower extremity Doppler showed left peroneal vein DVT - Heparin drip stopped and patient had IVC filter placement 08/13/2015 - 2 D ECHO showed normal EF and grade 1 diastolic dysfunction   Acute upper GI bleed / acute blood loss anemia - Unclear etiology - Likely secondary to heparin drip which was stopped and pt underwent IVC filter placement - Hemoglobin 10 this am - GI has seen the pt in consultation, plan for endoscopy today   Fever - Thought to be secondary to pulmonary embolism and pulmonary infarct seen on CT scan  - No fevers in past 24 hours - We'll continue to monitor temperature   Watery stools due to Crohn's disease / Fever  - Had 9 bowel movements in past 24 hours; he had fever few days prior and leukocytosis but for some reason stool for C,diff not collected as thought was that it is normal for him to have such frequent BM -  He however has low grade fever and it is concerning for C.diff  - Will collect the stool for C.diff    DVT prophylaxis: IVC filter  Code Status: full code  Family Communication: wife at the bedside this am Disposition Plan: Home likely in by 08/16/2015. Plan for endoscopy today.   Consultants:   Vascular surgery, Dr. Servando Snare  GI, Dr. Clarene Essex  Procedures:   IVC filter placed 08/13/2015   LE doppler 08/14/2015 - right superficial vein thrombosis of the greater saphenous vein from groin to ankle and DVT of the left peroneal vein. No evidence of Baker's cyst on the right or left.  ECHO - EF 123456, grade 1 diastolic dysfunction  Antimicrobials:   None   Subjective: No overnight events.   Objective: Vitals:   08/13/15 1629 08/13/15 2045 08/14/15 0429 08/14/15 0939  BP: (!) 150/72 135/83 (!) 121/50 140/83  Pulse: (!) 109 (!) 104 91 98  Resp: 18 18 17 18   Temp: 99.6 F (37.6 C) 99.5 F (37.5 C) 98.2 F (36.8 C) 98.8 F (37.1 C)  TempSrc: Oral Oral Oral Oral  SpO2: 93% 95% 96% 97%  Weight:      Height:        Intake/Output Summary (Last 24 hours) at 08/14/15 0947 Last data filed at 08/13/15 2058  Gross per 24 hour  Intake             1100 ml  Output             1100 ml  Net                0 ml   Filed Weights   08/11/15 1611 08/11/15 2028 08/13/15 1245  Weight: 79.4 kg (175 lb) 83.2 kg (183 lb 6.8 oz) 84.9 kg (187 lb 3.2 oz)    Examination:  General exam: Appears calm and comfortable  Respiratory system: Clear to auscultation. Respiratory effort normal. Cardiovascular system: S1 & S2 heard, RRR. No JVD. Gastrointestinal system: Abdomen is nondistended, soft and nontender. No organomegaly or masses felt. Normal bowel sounds heard. Central nervous system: Alert and oriented. No focal neurological deficits. Extremities: Symmetric 5 x 5 power. Skin: No rashes, lesions or ulcers Psychiatry: Judgement and insight appear normal. Mood & affect appropriate.    Data Reviewed: I have personally reviewed following labs and imaging studies  CBC:  Recent Labs Lab 08/11/15 1611 08/11/15 1824 08/12/15 0640 08/13/15 0536 08/13/15 1023  WBC 10.8*  --  10.4 9.0 9.1  NEUTROABS 5.9  --   --   --   --   HGB 11.7*  --  10.8* 10.3* 10.0*  HCT 35.8*  --  31.9* 30.5* 29.2*  MCV 95.7  --  95.2 95.0 93.9  PLT 272 240 224 250 [id number]   Basic Metabolic Panel:  Recent Labs Lab 08/11/15 1611 08/11/15 1631 08/12/15 0640  NA 140  --  138  K 3.8  --  4.2  CL 104  --  101  CO2 25  --  27  GLUCOSE 138*  --  131*  BUN 21*  --  24*  CREATININE 1.31* 1.20 1.24  CALCIUM 9.2  --  8.7*   GFR: Estimated Creatinine Clearance: 64.9 mL/min (by C-G formula based on SCr of 1.24 mg/dL). Liver Function Tests:  Recent Labs Lab 08/11/15 1611  AST 29  ALT 27  ALKPHOS 68  BILITOT 0.6  PROT 8.3*  ALBUMIN 4.5    Recent Labs Lab 08/11/15 1611  LIPASE 43   No results for input(s): AMMONIA in the last 168 hours. Coagulation Profile:  Recent Labs Lab 08/12/15 0640 08/13/15 0536 08/14/15 0321  INR 1.14 1.07 1.20   Cardiac Enzymes: No results for input(s): CKTOTAL, CKMB, CKMBINDEX, TROPONINI in the last 168 hours. BNP (last 3 results) No results for input(s): PROBNP in the last 8760 hours. HbA1C: No results for input(s): HGBA1C in the last 72 hours. CBG: No results for input(s): GLUCAP in the last 168 hours. Lipid Profile: No results for input(s): CHOL, HDL, LDLCALC, TRIG, CHOLHDL, LDLDIRECT in the last 72 hours. Thyroid Function Tests: No results for input(s): TSH, T4TOTAL, FREET4, T3FREE, THYROIDAB in the last 72 hours. Anemia Panel: No results for input(s): VITAMINB12, FOLATE, FERRITIN, TIBC, IRON, RETICCTPCT in the last 72 hours. Urine analysis:    Component Value Date/Time   COLORURINE YELLOW 08/23/2013 0920   APPEARANCEUR CLOUDY (A) 08/23/2013 0920   LABSPEC 1.020 08/23/2013 0920   PHURINE 5.5 08/23/2013 0920   GLUCOSEU NEGATIVE  08/23/2013 0920   HGBUR NEGATIVE 08/23/2013 0920   BILIRUBINUR NEGATIVE 08/23/2013 0920   KETONESUR NEGATIVE 08/23/2013 0920   PROTEINUR NEGATIVE 08/23/2013 0920   UROBILINOGEN 0.2 08/23/2013 0920   NITRITE POSITIVE (A) 08/23/2013 0920   LEUKOCYTESUR MODERATE (A) 08/23/2013 0920   Sepsis Labs: @LABRCNTIP (procalcitonin:4,lacticidven:4)   )No results found for this or any previous visit (from the past 240 hour(s)).    Radiology Studies: Ct Angio Chest/abd/pel For Dissection W And/or Wo Contrast Result Date: 08/11/2015 CLINICAL DATA:  CT CHEST IMPRESSION 1.  Bilateral pulmonary emboli, most prominently in the left lower lobe. There is left greater than right lower lobe airspace opacity that is likely atelectasis. Early pulmonary infarction is not excluded. 2. Essentially normal thoracoabdominal aorta.  No dissection. CT ABDOMEN AND PELVIS IMPRESSION 1. Normal appearance of the abdominal aorta. No dissection. Branch vessels are widely patent. 2. Nonocclusive thrombus noted in the superior mesenteric vein. 3. No other acute findings in the abdomen or pelvis.     Scheduled Meds: . [MAR Hold] Ferrous Fumarate  1 tablet Oral BID  . [MAR Hold] multivitamin with minerals  1 tablet Oral Daily  . [MAR Hold] pantoprazole  40 mg Intravenous Q12H  . [MAR Hold] predniSONE  5 mg Oral Daily   Continuous Infusions: . sodium chloride Stopped (08/13/15 1842)  . sodium chloride 20 mL/hr at 08/13/15 1842  . pantoprozole (PROTONIX) infusion 8 mg/hr (08/14/15 0633)     LOS: 3 days    Time spent: 25 minutes  Greater than 50% of the time spent on counseling and coordinating the care.   Leisa Lenz, MD [facility] Pager [phone]  If 7PM-7AM, please contact night-coverage [email or web address] Password [facility] 08/14/2015, 9:47 AM

## 2015-08-14 NOTE — Progress Notes (Signed)
**  Preliminary report by tech**  Findings suggestive of right superficial vein thrombosis of the great saphenous vein from groin to ankle and deep vein thrombosis of the left peroneal vein. No evidence of Baker's cyst on the right or left. Results given to the patient's nurse, Cole Camp.  08/14/15 9:25 AM Frank Dodson RVT

## 2015-08-14 NOTE — Progress Notes (Signed)
Foley catheter removed per verbal order from the physician. Patient tolerated it well. Will continue to monitor for urine output.

## 2015-08-14 NOTE — Anesthesia Procedure Notes (Signed)
Procedure Name: MAC Date/Time: 08/14/2015 11:02 AM Performed by: Lance Coon Pre-anesthesia Checklist: Patient identified, Emergency Drugs available, Suction available, Patient being monitored and Timeout performed Oxygen Delivery Method: Nasal cannula Intubation Type: IV induction

## 2015-08-14 NOTE — Anesthesia Preprocedure Evaluation (Signed)
Anesthesia Evaluation  Patient identified by MRN, date of birth, ID band Patient awake    Reviewed: Allergy & Precautions, NPO status , Patient's Chart, lab work & pertinent test results  History of Anesthesia Complications (+) PONV  Airway Mallampati: II  TM Distance: >3 FB Neck ROM: Full    Dental  (+) Teeth Intact   Pulmonary pneumonia,    breath sounds clear to auscultation       Cardiovascular hypertension,  Rhythm:Regular Rate:Tachycardia     Neuro/Psych    GI/Hepatic GERD  ,GI bleed   Endo/Other    Renal/GU      Musculoskeletal   Abdominal   Peds  Hematology  (+) anemia ,   Anesthesia Other Findings   Reproductive/Obstetrics                             Anesthesia Physical Anesthesia Plan  ASA: III  Anesthesia Plan: MAC   Post-op Pain Management:    Induction: Intravenous  Airway Management Planned: Natural Airway and Nasal Cannula  Additional Equipment:   Intra-op Plan:   Post-operative Plan:   Informed Consent: I have reviewed the patients History and Physical, chart, labs and discussed the procedure including the risks, benefits and alternatives for the proposed anesthesia with the patient or authorized representative who has indicated his/her understanding and acceptance.     Plan Discussed with: CRNA  Anesthesia Plan Comments:         Anesthesia Quick Evaluation

## 2015-08-14 NOTE — Consult Note (Signed)
Reason for Consult: Hematemesis Referring Physician: Hospital team  Frank Dodson is an 65 y.o. male.  HPI: Patient seen and examined and discussed with the hospital team yesterday and his hospital computer chart and our office computer chart was reviewed and he does have chronic anemia on chronic iron and takes Imodium chronically but no aspirin or nonsteroidals and his pump inhibitor helps his upper tract symptoms but when he was found to have a pulmonary embolus and started on heparin he did throw up some blood and we are consulted for endoscopy to see if safe to resume blood thinners and his previous endoscopy and multiple colonoscopies were reviewed  Past Medical History:  Diagnosis Date  . Crohn's colitis ([place])    history of due to surgery  . Decreased range of motion (ROM) of shoulder    "both shoulders; never had a rotator cuff tear; at some point in my life I've sheared both muscles off"  . Eczema   . Fecal urgency   . Frequent bowel movements    06-28-13  extreme urgency with bowel movements "3-5" per day or more  . GERD (gastroesophageal reflux disease)   . History of gout   . Hypertension   . Iron deficiency anemia   . Osteoarthritis   . Pneumonia ~ 2007  . PONV (postoperative nausea and vomiting)    "with ether"  . Rheumatoid arthritis ([place])   . Rotator cuff tear    left - limited motion of arm    Past Surgical History:  Procedure Laterality Date  . COLECTOMY    . COLONOSCOPY    . CYSTOSCOPY  ~ 2013   "clean out of uric acid deposits"  . ENTEROSTOMY CLOSURE  11/2014  . ESOPHAGOGASTRODUODENOSCOPY    . JOINT REPLACEMENT    . LAPAROSCOPIC PARTIAL PROTECTOMY  10/2014   with J pouch  . PERIPHERAL VASCULAR CATHETERIZATION N/A 08/13/2015   Procedure: IVC Filter Insertion;  Surgeon: Waynetta Sandy, MD;  Location: [place] [facility];  Service: Cardiovascular;  Laterality: N/A;  . REVERSE SHOULDER ARTHROPLASTY Left 05/15/2015  . REVERSE SHOULDER ARTHROPLASTY Left  05/15/2015   Procedure: LEFT REVERSE SHOULDER ARTHROPLASTY;  Surgeon: Justice Britain, MD;  Location: [facility];  Service: Orthopedics;  Laterality: Left;  . 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 TOTAL KNEE ARTHROPLASTY Right 09/04/2013   Procedure: RIGHT TOTAL KNEE ARTHROPLASTY;  Surgeon: Mauri Pole, MD;  Location: [facility];  Service: Orthopedics;  Laterality: Right;  . TOTAL KNEE ARTHROPLASTY Left 2011    Family History  Problem Relation Age of Onset  . Subarachnoid hemorrhage Father   . Diabetes Mellitus II Father     Social History:  reports that he has never smoked. He has never used smokeless tobacco. He reports that he drinks alcohol. He reports that he does not use drugs.  Allergies: No Known Allergies  Medications: I have reviewed the patient's current medications.  Results for orders placed or performed during the hospital encounter of 08/11/15 (from the past 48 hour(s))  Heparin level (unfractionated)     Status: None   Collection Time: 08/12/15 11:54 AM  Result Value Ref Range   Heparin Unfractionated 0.34 0.30 - 0.70 IU/mL    Comment:        IF HEPARIN RESULTS ARE BELOW EXPECTED VALUES, AND PATIENT DOSAGE HAS BEEN CONFIRMED, SUGGEST FOLLOW UP TESTING OF ANTITHROMBIN III LEVELS.   Protime-INR  Status: None   Collection Time: 08/13/15  5:36 AM  Result Value Ref Range   Prothrombin Time 14.0 11.4 - 15.2 seconds   INR 1.07   Heparin level (unfractionated)     Status: Abnormal   Collection Time: 08/13/15  5:36 AM  Result Value Ref Range   Heparin Unfractionated 0.14 (L) 0.30 - 0.70 IU/mL    Comment:        IF HEPARIN RESULTS ARE BELOW EXPECTED VALUES, AND PATIENT DOSAGE HAS BEEN CONFIRMED, SUGGEST FOLLOW UP TESTING OF ANTITHROMBIN III LEVELS.   CBC     Status: Abnormal   Collection Time: 08/13/15  5:36 AM  Result Value Ref Range   WBC 9.0 4.0 - 10.5 K/uL   RBC 3.21 (L) 4.22 - 5.81 MIL/uL   Hemoglobin 10.3 (L)  13.0 - 17.0 g/dL   HCT 30.5 (L) 39.0 - 52.0 %   MCV 95.0 78.0 - 100.0 fL   MCH 32.1 26.0 - 34.0 pg   MCHC 33.8 30.0 - 36.0 g/dL   RDW 14.0 11.5 - 15.5 %   Platelets 250 150 - 400 K/uL  Occult blood gastric / duodenum     Status: Abnormal   Collection Time: 08/13/15  7:41 AM  Result Value Ref Range   pH, Gastric 2    Occult Blood, Gastric POSITIVE (A) NEGATIVE  CBC     Status: Abnormal   Collection Time: 08/13/15 10:23 AM  Result Value Ref Range   WBC 9.1 4.0 - 10.5 K/uL   RBC 3.11 (L) 4.22 - 5.81 MIL/uL   Hemoglobin 10.0 (L) 13.0 - 17.0 g/dL   HCT 29.2 (L) 39.0 - 52.0 %   MCV 93.9 78.0 - 100.0 fL   MCH 32.2 26.0 - 34.0 pg   MCHC 34.2 30.0 - 36.0 g/dL   RDW 14.0 11.5 - 15.5 %   Platelets 230 150 - 400 K/uL  Protime-INR     Status: Abnormal   Collection Time: 08/14/15  3:21 AM  Result Value Ref Range   Prothrombin Time 15.3 (H) 11.4 - 15.2 seconds   INR 1.20     No results found.  ROS negative except above Blood pressure 140/83, pulse 98, temperature 98.8 F (37.1 C), temperature source Oral, resp. rate 18, height 5\' 11"  (1.803 m), weight 84.9 kg (187 lb 3.2 oz), SpO2 97 %. Physical Exam vital signs stable afebrile no acute distress exam please see preassessment evaluation labs reviewed hemoglobin stable CT and filter procedure reviewed Assessment/Plan: Upper GI bleeding on heparin in patient with Crohn's disease and chronic iron deficiency Plan: The risks and benefits of endoscopy was discussed with he and his mother and will proceed this morning with further workup plans and recommendations pending those findings and as an aside therapy request to resume his home Imodium dose which helps his diarrhea  Frank Dodson E 08/14/2015, 10:46 AM

## 2015-08-15 ENCOUNTER — Telehealth: Payer: Self-pay | Admitting: Vascular Surgery

## 2015-08-15 DIAGNOSIS — A047 Enterocolitis due to Clostridium difficile: Secondary | ICD-10-CM

## 2015-08-15 LAB — CBC
HEMATOCRIT: 27.3 % — AB (ref 39.0–52.0)
HEMOGLOBIN: 8.8 g/dL — AB (ref 13.0–17.0)
MCH: 31 pg (ref 26.0–34.0)
MCHC: 32.2 g/dL (ref 30.0–36.0)
MCV: 96.1 fL (ref 78.0–100.0)
Platelets: 259 10*3/uL (ref 150–400)
RBC: 2.84 MIL/uL — ABNORMAL LOW (ref 4.22–5.81)
RDW: 13.6 % (ref 11.5–15.5)
WBC: 5.8 10*3/uL (ref 4.0–10.5)

## 2015-08-15 LAB — DRVVT MIX: DRVVT MIX: 61.8 s — AB (ref 0.0–47.0)

## 2015-08-15 LAB — LUPUS ANTICOAGULANT PANEL
DRVVT: 101.9 s — AB (ref 0.0–47.0)
PTT LA: 56.5 s — AB (ref 0.0–51.9)

## 2015-08-15 LAB — PROTIME-INR
INR: 1.2
Prothrombin Time: 15.3 seconds — ABNORMAL HIGH (ref 11.4–15.2)

## 2015-08-15 LAB — PTT-LA MIX: PTT-LA Mix: 55.3 s — ABNORMAL HIGH (ref 0.0–48.9)

## 2015-08-15 LAB — HEXAGONAL PHASE PHOSPHOLIPID: HEXAGONAL PHASE PHOSPHOLIPID: 22 s — AB (ref 0–11)

## 2015-08-15 LAB — DRVVT CONFIRM: dRVVT Confirm: 1.9 ratio — ABNORMAL HIGH (ref 0.8–1.2)

## 2015-08-15 MED ORDER — RIVAROXABAN (XARELTO) VTE STARTER PACK (15 & 20 MG): ORAL_TABLET | ORAL | 0 refills | Status: DC

## 2015-08-15 MED ORDER — RIVAROXABAN 15 MG PO TABS: 15.0000 mg | ORAL_TABLET | Freq: Two times a day (BID) | ORAL | 0 refills | Status: DC

## 2015-08-15 MED ORDER — METRONIDAZOLE 500 MG PO TABS: 500.0000 mg | ORAL_TABLET | Freq: Three times a day (TID) | ORAL | 0 refills | Status: DC

## 2015-08-15 MED ORDER — RIVAROXABAN 15 MG PO TABS
15.0000 mg | ORAL_TABLET | Freq: Two times a day (BID) | ORAL | Status: DC
Start: 2015-08-15 — End: 2015-08-15
  Administered 2015-08-15: 15 mg via ORAL
  Filled 2015-08-15: qty 1

## 2015-08-15 MED ORDER — PANTOPRAZOLE SODIUM 40 MG PO TBEC: 40.0000 mg | DELAYED_RELEASE_TABLET | Freq: Two times a day (BID) | ORAL | 0 refills | Status: AC

## 2015-08-15 NOTE — Progress Notes (Signed)
08/15/2015 1:51 PM Discharge AVS meds taken today and those due this evening reviewed.  Follow-up appointments and when to call md reviewed.  D/C IV and TELE.  Questions and concerns addressed.   D/C home per orders. [place]

## 2015-08-15 NOTE — Addendum Note (Signed)
Addendum  created 08/15/15 0845 by Rica Koyanagi, MD   Actions taken from a BestPractice Advisory, Order sets accessed

## 2015-08-15 NOTE — Care Management Note (Signed)
Case Management Note  Patient Details  Name: Frank Dodson MRN: [id number] Date of Birth: 04-21-1950  Subjective/Objective:    pulmonary embolus                Action/Plan: Discharge Planning: AVS reviewed:  NCM spoke to Dr. Charlies Silvers to clarify Xarelto. Pt is to take Starter pack for medication and then follow up with PCP for continued dosing. Reviewed information with Unit RN and pt/wife, [name]. Faxed Rx to [facility] with Xarelto 30 day free trial card. Contacted pharmacist, Mia to make aware of update on Xarelto and dosing. They do have medication in stock.   Expected Discharge Date:  08/15/2015               Expected Discharge Plan:  Home/Self Care  In-House Referral:     Discharge planning Services  CM Consult  Post Acute Care Choice:  NA Choice offered to:  NA  DME Arranged:  N/A DME Agency:  NA  HH Arranged:  NA HH Agency:  NA  Status of Service:  Completed, signed off  If discussed at [place] of Stay Meetings, dates discussed:    Additional Comments:  Erenest Rasher, RN 08/15/2015, 12:33 PM

## 2015-08-15 NOTE — Telephone Encounter (Signed)
-----   Message from Mena Goes, RN sent at 08/14/2015  2:44 PM EDT ----- Regarding: schedule 4-6 weeks    ----- Message ----- From: Waynetta Sandy, MD Sent: 08/14/2015  12:59 PM To: [facility] Charge Pool  F/u in office 4-6 weeks.

## 2015-08-15 NOTE — Discharge Instructions (Addendum)
Information on my medicine - XARELTO (rivaroxaban)  This medication education was reviewed with me or my healthcare representative as part of my discharge preparation.  The pharmacist that spoke with me during my hospital stay was:  Wayland Salinas, [place]? Xarelto was prescribed to treat blood clots that may have been found in the veins of your legs (deep vein thrombosis) or in your lungs (pulmonary embolism) and to reduce the risk of them occurring again.  What do you need to know about Xarelto? The starting dose is one 15 mg tablet taken TWICE daily with food for the FIRST 21 DAYS then on (enter date)  09/05/2015  the dose is changed to one 20 mg tablet taken ONCE A DAY with your evening meal.  DO NOT stop taking Xarelto without talking to the health care provider who prescribed the medication.  Refill your prescription for 20 mg tablets before you run out.  After discharge, you should have regular check-up appointments with your healthcare provider that is prescribing your Xarelto.  In the future your dose may need to be changed if your kidney function changes by a significant amount.  What do you do if you miss a dose? If you are taking Xarelto TWICE DAILY and you miss a dose, take it as soon as you remember. You may take two 15 mg tablets (total 30 mg) at the same time then resume your regularly scheduled 15 mg twice daily the next day.  If you are taking Xarelto ONCE DAILY and you miss a dose, take it as soon as you remember on the same day then continue your regularly scheduled once daily regimen the next day. Do not take two doses of Xarelto at the same time.   Important Safety Information Xarelto is a blood thinner medicine that can cause bleeding. You should call your healthcare provider right away if you experience any of the following: ? Bleeding from an injury or your nose that does not stop. ? Unusual colored urine (red or  dark brown) or unusual colored stools (red or black). ? Unusual bruising for unknown reasons. ? A serious fall or if you hit your head (even if there is no bleeding).  Some medicines may interact with Xarelto and might increase your risk of bleeding while on Xarelto. To help avoid this, consult your healthcare provider or pharmacist prior to using any new prescription or non-prescription medications, including herbals, vitamins, non-steroidal anti-inflammatory drugs (NSAIDs) and supplements.  This website has more information on Xarelto: [email or web address]

## 2015-08-15 NOTE — Progress Notes (Signed)
Darlyn Read 8:59 AM  Subjective: Patient without any obvious further bleeding and without any post EGD problems and we rediscussed the findings and he has no new complaints  Objective: Vital signs stable afebrile patient on the commode not examined today no new lab except INR Assessment: Erosive esophagitis cause of bleeding  Plan: Okay with me to resume blood thinners and he can follow-up with my partner Dr. Michail Sermon and have warned him he may see a little bit of blood until this heals but doubt it would be life-threatening and he should be on twice a day pump inhibitors and will need a repeat endoscopy in a few months to document healing and please call us if we could be of any further assistance with this hospital stay  [facility] E  Pager [phone] After 5PM or if no answer call [phone]

## 2015-08-15 NOTE — Progress Notes (Signed)
ANTICOAGULATION CONSULT NOTE - Initial Consult  Pharmacy Consult for Xarelto Indication: pulmonary embolus  No Known Allergies  Patient Measurements: Height: 5\' 11"  (180.3 cm) Weight: 187 lb 3.2 oz (84.9 kg) IBW/kg (Calculated) : 75.3  Vital Signs: Temp: 98.2 F (36.8 C) (08/04 0504) Temp Source: Oral (08/04 0504) BP: 131/70 (08/04 0504) Pulse Rate: 89 (08/04 0504)  Labs:  Recent Labs  08/12/15 1154  08/13/15 0536 08/13/15 1023 08/14/15 0321 08/14/15 1158 08/14/15 2122 08/15/15 0329  HGB  --   < > 10.3* 10.0*  --  9.2* 8.4*  --   HCT  --   < > 30.5* 29.2*  --  28.2* 26.0*  --   PLT  --   < > 250 230  --  223 234  --   LABPROT  --   --  14.0  --  15.3*  --   --  15.3*  INR  --   --  1.07  --  1.20  --   --  1.20  HEPARINUNFRC 0.34  --  0.14*  --   --   --   --   --   < > = values in this interval not displayed.  Estimated Creatinine Clearance: 64.9 mL/min (by C-G formula based on SCr of 1.24 mg/dL).   Medical History: Past Medical History:  Diagnosis Date  . Crohn's colitis ([place])    history of due to surgery  . Decreased range of motion (ROM) of shoulder    "both shoulders; never had a rotator cuff tear; at some point in my life I've sheared both muscles off"  . Eczema   . Fecal urgency   . Frequent bowel movements    06-28-13  extreme urgency with bowel movements "3-5" per day or more  . GERD (gastroesophageal reflux disease)   . History of gout   . Hypertension   . Iron deficiency anemia   . Osteoarthritis   . Pneumonia ~ 2007  . PONV (postoperative nausea and vomiting)    "with ether"  . Rheumatoid arthritis ([place])   . Rotator cuff tear    left - limited motion of arm    Assessment:  AC: started on hep drip at [facility] for new PE, then GIB on PPI gtt.  Transferred to [facility] for IVC filter. Hg 9.2 stable, plt wnl  Gi:  -S/p EGD 8/3: LA Grade D (one or more mucosal breaks involving at least 75% of esophageal circumference) esophagitis with friability was  found. A medium-sized hiatal hernia was present.   Plan:  - ok to resume blood thinners per GI, expect some hematemesis, continue on PPI BID long-term, soft diet - Xarelto 15mg  BID x 21d then 20mg  daily   Goal of Therapy:  Therapeutic oral anticoagulation   Vickye Astorino S. Alford Highland, PharmD, [facility] Clinical Staff Pharmacist Pager [phone]  Eilene Ghazi [place] 08/15/2015,10:18 AM

## 2015-08-15 NOTE — Discharge Summary (Signed)
Physician Discharge Summary  Frank Dodson [id number] DOB: 10/14/1950 DOA: 08/11/2015  PCP: Gerrit Heck, MD  Admit date: 08/11/2015 Discharge date: 08/15/2015  Recommendations for Outpatient Follow-up:  Please continue flagyl for 7 days on discharge for C.diff. Per GI okay to resume anticoagulation as well as protonix as patient is high risk of re bleed if he would be on anything else other than protonix.  Discharge instructions    Complete by:  As directed  Take xarelto 15 mg twice a day for 21 days and then on day 22 start 20 mg daily.    Discharge Diagnoses:  Active Problems:   Pulmonary emboli (HCC)   Leukocytosis   Lactic acidosis   Acute pulmonary embolism (HCC)   Coffee ground emesis   Melanotic stools    Discharge Condition: stable; I spoke with the patient and his wife at the bedside and told them that I would like the patient states for additional 1 day to recheck hemoglobin to make sure it is stable after starting xarelto. Patient insisted on going home today and his wife will make an appointment with primary care physician for Monday to recheck hemoglobin. I spoke with patient and his wife about signs of bleeding and if there is any sign of bleeding to stop blood thinner immediately and either come to emergency room or check with the primary care physician whoever is available first.  Diet recommendation: as tolerated   History of present illness:  65 y.o.malepast medical history of Crohn's disease on steroids, essential hypertension who presented to [facility] with severe chest pain pressure radiating to left lower abdomen started on the day of the admission. Patient was subsequently found to have large acute bilateral pulmonary emboli. He was subsequently started on heparin drip but developed coffee-ground hematemesis and melanotic stools. He underwent IVC filter placement 08/13/2015. He has been seen by GI in consultation.  Hospital Course:     Assessment & Plan:  Acute bilateral pulmonary emboli (HCC) / left peroneal vein DVT - Unclear etiology. Hypercoagulable panel pending although this should probably be done in outpatient setting as patient was already on heparin and results may not be accurate - CT angiogram of the chest showed bilateral pulmonary emboli and lower extremity Doppler showed left peroneal vein DVT - Heparin drip stopped and patient had IVC filter placement 08/13/2015 - We will start xarelto prior to discharge  - 2 D ECHO showed normal EF and grade 1 diastolic dysfunction   Acute upper GI bleed due to reflux esophagitis grade D / acute blood loss anemia - Patient had endoscopy done 08/14/2015 with findings of reflux esophagitis and hiatal hernia - He will need to be on Protonix 40 mg twice daily, he is at high risk of rebleed should he discontinue Protonix. I spoke with GI if we need to change it to Pepcid but per GI Pepcid would not provide adequate coverage for esophagitis - Hemoglobin 8.4 today  Watery stools due to Crohn's disease / Fever / C.diff enteritis  - Stool for C.diff positive - We will treat with Flagyl, per GI for 7 days, should suffice - I spoke with the GI as well about patient being on Protonix. GI recommends continuing Protonix twice a day as patient is high risk for rebleed because he needs to be on blood thinners for extensive pulmonary embolism. Unfortunately Pepcid may not provide adequate coverage for esophagitis as Protonix well. - I recommended discontinuation of Imodium. Patient and his wife aware that  patient needs treatment for C. Difficile. - Patient will follow-up with a GI for repeat endoscopy to make sure that is a vaginitis is healing    DVT prophylaxis: IVC filter  Code Status: full code  Family Communication: wife at the bedside this am    Consultants:   Vascular surgery, Dr. Servando Snare  GI, Dr. Clarene Essex  Procedures:   IVC filter placed 08/13/2015    LE doppler 08/14/2015 - right superficial vein thrombosis of the greater saphenous vein from groin to ankle and DVT of the left peroneal vein. No evidence of Baker's cyst on the right or left.  ECHO - EF 123456, grade 1 diastolic dysfunction  EGD 08/14/2015 - erosive gastritis, medium size hiatal hernia   Antimicrobials:   None   Signed:  Leisa Lenz, MD  [facility] 08/15/2015, 9:43 AM  Pager #: [phone]  Time spent in minutes: more than 30 minutes    Discharge Exam: Vitals:   08/14/15 1953 08/15/15 0504  BP: 139/80 131/70  Pulse: 95 89  Resp: (!) 24 20  Temp: 98.5 F (36.9 C) 98.2 F (36.8 C)   Vitals:   08/14/15 1125 08/14/15 1531 08/14/15 1953 08/15/15 0504  BP: 120/63 133/75 139/80 131/70  Pulse: 85 97 95 89  Resp: 15 20 (!) 24 20  Temp:  98.7 F (37.1 C) 98.5 F (36.9 C) 98.2 F (36.8 C)  TempSrc:  Oral Oral Oral  SpO2: 99% 97% 97% 99%  Weight:      Height:        General: Pt is alert, follows commands appropriately, not in acute distress Cardiovascular: Regular rate and rhythm, S1/S2 +, no murmurs Respiratory: Clear to auscultation bilaterally, no wheezing, no crackles, no rhonchi Abdominal: Soft, non tender, non distended, bowel sounds +, no guarding Extremities: no edema, no cyanosis, pulses palpable bilaterally DP and PT Neuro: Grossly nonfocal  Discharge Instructions  Discharge Instructions    Call MD for:  difficulty breathing, headache or visual disturbances    Complete by:  As directed   Call MD for:  persistant dizziness or light-headedness    Complete by:  As directed   Call MD for:  redness, tenderness, or signs of infection (pain, swelling, redness, odor or green/yellow discharge around incision site)    Complete by:  As directed   Call MD for:  severe uncontrolled pain    Complete by:  As directed   Diet - low sodium heart healthy    Complete by:  As directed   Discharge instructions    Complete by:  As directed   Please  continue flagyl for 7 days on discharge for C.diff. Per GI okay to resume anticoagulation as well as protonix as patient is high risk of re bleed if he would be on anything else other than protonix.   Discharge instructions    Complete by:  As directed   Take xarelto 15 mg twice a day for 21 days and then on day 22 start 20 mg daily.   Increase activity slowly    Complete by:  As directed       Medication List    STOP taking these medications   IMODIUM A-D 2 MG tablet Generic drug:  loperamide     TAKE these medications   acetaminophen 650 MG CR tablet Commonly known as:  TYLENOL Take 1,300 mg by mouth every 8 (eight) hours as needed for pain.   allopurinol 100 MG tablet Commonly known as:  ZYLOPRIM Take  100 mg by mouth 2 (two) times daily.   doxazosin 4 MG tablet Commonly known as:  CARDURA Take 4 mg by mouth every evening.   ferrous fumarate 325 (106 Fe) MG Tabs tablet Commonly known as:  HEMOCYTE - 106 mg FE Take 1 tablet by mouth 2 (two) times daily.   hydrOXYzine 25 MG tablet Commonly known as:  ATARAX/VISTARIL Take 50 mg by mouth daily.   magnesium gluconate 500 MG tablet Commonly known as:  MAGONATE Take 500 mg by mouth daily.   metroNIDAZOLE 500 MG tablet Commonly known as:  FLAGYL Take 1 tablet (500 mg total) by mouth 3 (three) times daily.   multivitamin with minerals Tabs tablet Take 1 tablet by mouth daily.   pantoprazole 40 MG tablet Commonly known as:  PROTONIX Take 1 tablet (40 mg total) by mouth 2 (two) times daily before a meal. What changed:  when to take this   predniSONE 5 MG tablet Commonly known as:  DELTASONE Take 5 mg by mouth daily.   quinapril 40 MG tablet Commonly known as:  ACCUPRIL Take 40 mg by mouth 2 (two) times daily.   Rivaroxaban 15 & 20 MG Tbpk Take as directed on package: Start with one 15mg  tablet by mouth twice a day with food. On Day 22, switch to one 20mg  tablet once a day with food.   traMADol 50 MG  tablet Commonly known as:  ULTRAM Take 100 mg by mouth 2 (two) times daily.   vitamin C 500 MG tablet Commonly known as:  ASCORBIC ACID Take 500 mg by mouth daily.   zolpidem 10 MG tablet Commonly known as:  AMBIEN Take 10 mg by mouth at bedtime.      Follow-up Information    Gerrit Heck, MD. Schedule an appointment as soon as possible for a visit in 2 week(s).   Specialty:  Family Medicine Contact information: [place] [address] [phone]            The results of significant diagnostics from this hospitalization (including imaging, microbiology, ancillary and laboratory) are listed below for reference.    Significant Diagnostic Studies: Ct Angio Chest/abd/pel For Dissection W And/or Wo Contrast  Result Date: 08/11/2015 CLINICAL DATA:  Pt began to have central CP yesterday. Today while at lunch pain began to get worse suddenly. Accompanied by SOB. Pain radiates to L side of abd. EXAM: CT ANGIO CHEST-ABD-PELV FOR DISSECTION W/ AND WO/W CM<Procedure Description>CT ANGIO CHEST-ABD-PELV FOR DISSECTION W/ AND WO/W CM TECHNIQUE: Unenhanced CT imaging of the chest was performed. CT imaging was then acquired through the chest, abdomen and pelvis following the dynamic injection of 100 mL of Isovue 370 intravenous contrast. Computer-generated coronal and sagittal MIP and MPR images were also obtained. CONTRAST:  100 mL of Isovue 370 intravenous contrast. COMPARISON:  Chest radiograph, 06/28/2013. Abdomen and pelvis CT, 03/23/2012. None. FINDINGS: CT CHEST ANGIOGRAPHIC FINDINGS: Thoracic aorta is normal in caliber. There is minor atherosclerotic calcifications along the arch and at the origin of the left subclavian artery and in the innominate artery. a there are bilateral pulmonary emboli. On the left, near occlusive embolus is seen in the lower lobe pulmonary artery extending into segmental branches. On the right, new pulmonary embolus straddles the  right middle lobe and lower lobe pulmonary arteries. Segmental branch emboli are noted in the right lower lobe and right middle lobe. Possible emboli are noted in the right upper lobe segmental and subsegmental branches. NON ANGIOGRAPHIC FINDINGS: Neck base  and axilla:  No mass or adenopathy. Mediastinum and hila: Heart normal in size and configuration. No significant coronary artery calcifications. No mediastinal or hilar masses or enlarged lymph nodes. Lungs and pleura: Patchy airspace opacity is noted in the lower lobes, left greater than right. Mild prominence of the vascular markings are noted bilaterally. Minimal left pleural effusion. No pneumothorax. CT ABDOMEN PELVIS ANGIOGRAPHIC FINDINGS: The abdominal aorta is normal in caliber. No dissection. No atherosclerotic plaque. The celiac axis, superior mesenteric artery single bilateral renal arteries and the inferior mesenteric artery are widely patent. There is minor partly calcified plaque along the left common iliac artery and at the bifurcation of the right common iliac artery and in both internal iliac arteries. Plaque is noted in the common femoral arteries, greater on the right. No significant stenosis. There is nonocclusive thrombus in the superior mesenteric vein centrally. NON ANGIOGRAPHIC FINDINGS: Liver, spleen, gallbladder, pancreas, adrenal glands:  Unremarkable. Kidneys, ureters, bladder: Mild renal cortical thinning. Nonobstructing stone and in the lower pole the right kidney. No renal masses. No hydronephrosis. Normal ureters. Normal bladder. Lymph nodes:  No adenopathy. Gastrointestinal: Small hiatal hernia. Stomach otherwise unremarkable. Normal small bowel. There is a rectosigmoid anastomosis staple line. Another staple line is noted involving the right colon. There is no colon dilation, wall thickening or adjacent inflammation. MUSCULOSKELETAL FINDINGS: There are degenerative changes of the spine mostly the lumbar spine. Bones are  demineralized. There are no osteoblastic or osteolytic lesions. IMPRESSION: CT CHEST IMPRESSION 1. Bilateral pulmonary emboli, most prominently in the left lower lobe. There is left greater than right lower lobe airspace opacity that is likely atelectasis. Early pulmonary infarction is not excluded. 2. Essentially normal thoracoabdominal aorta.  No dissection. CT ABDOMEN AND PELVIS IMPRESSION 1. Normal appearance of the abdominal aorta. No dissection. Branch vessels are widely patent. 2. Nonocclusive thrombus noted in the superior mesenteric vein. 3. No other acute findings in the abdomen or pelvis. Electronically Signed   By: Lajean Manes M.D.   On: 08/11/2015 17:10    Microbiology: Recent Results (from the past 240 hour(s))  C difficile quick scan w PCR reflex     Status: Abnormal   Collection Time: 08/14/15  1:15 PM  Result Value Ref Range Status   C Diff antigen POSITIVE (A) NEGATIVE Final   C Diff toxin NEGATIVE NEGATIVE Final   C Diff interpretation Results are indeterminate. See PCR results.  Final  Clostridium Difficile by PCR     Status: Abnormal   Collection Time: 08/14/15  1:15 PM  Result Value Ref Range Status   Toxigenic C Difficile by pcr POSITIVE (A) NEGATIVE Final     Labs: Basic Metabolic Panel:  Recent Labs Lab 08/11/15 1611 08/11/15 1631 08/12/15 0640  NA 140  --  138  K 3.8  --  4.2  CL 104  --  101  CO2 25  --  27  GLUCOSE 138*  --  131*  BUN 21*  --  24*  CREATININE 1.31* 1.20 1.24  CALCIUM 9.2  --  8.7*   Liver Function Tests:  Recent Labs Lab 08/11/15 1611  AST 29  ALT 27  ALKPHOS 68  BILITOT 0.6  PROT 8.3*  ALBUMIN 4.5    Recent Labs Lab 08/11/15 1611  LIPASE 43   No results for input(s): AMMONIA in the last 168 hours. CBC:  Recent Labs Lab 08/11/15 1611  08/12/15 0640 08/13/15 0536 08/13/15 1023 08/14/15 1158 08/14/15 2122  WBC 10.8*  --  10.4  9.0 9.1 6.0 4.9  NEUTROABS 5.9  --   --   --   --   --   --   HGB 11.7*  --  10.8*  10.3* 10.0* 9.2* 8.4*  HCT 35.8*  --  31.9* 30.5* 29.2* 28.2* 26.0*  MCV 95.7  --  95.2 95.0 93.9 95.3 94.5  PLT 272  < > 224 250 230 223 234  < > = values in this interval not displayed. Cardiac Enzymes: No results for input(s): CKTOTAL, CKMB, CKMBINDEX, TROPONINI in the last 168 hours. BNP: BNP (last 3 results) No results for input(s): BNP in the last 8760 hours.  ProBNP (last 3 results) No results for input(s): PROBNP in the last 8760 hours.  CBG: No results for input(s): GLUCAP in the last 168 hours.

## 2015-08-15 NOTE — Anesthesia Postprocedure Evaluation (Signed)
Anesthesia Post Note  Patient: ORYON BRUGGEMAN  Procedure(s) Performed: Procedure(s) (LRB): ESOPHAGOGASTRODUODENOSCOPY (EGD) (N/A)  Patient location during evaluation: PACU Anesthesia Type: General Level of consciousness: awake and alert Pain management: pain level controlled Vital Signs Assessment: post-procedure vital signs reviewed and stable Respiratory status: spontaneous breathing, nonlabored ventilation, respiratory function stable and patient connected to nasal cannula oxygen Cardiovascular status: blood pressure returned to baseline and stable Postop Assessment: no signs of nausea or vomiting Anesthetic complications: no    Last Vitals:  Vitals:   08/14/15 1953 08/15/15 0504  BP: 139/80 131/70  Pulse: 95 89  Resp: (!) 24 20  Temp: 36.9 C 36.8 C    Last Pain:  Vitals:   08/15/15 0504  TempSrc: Oral  PainSc:                  Jamesina Gaugh,JAMES TERRILL

## 2015-08-15 NOTE — Telephone Encounter (Signed)
Sched appt 9/1 at 12:45. Lm on hm# to inform pt of appt.

## 2015-08-16 LAB — BETA-2-GLYCOPROTEIN I ABS, IGG/M/A
Beta-2-Glycoprotein I IgA: <9 GPI IgA units (ref 0–25)
Beta-2-Glycoprotein I IgM: <9 GPI IgM units (ref 0–32)

## 2015-08-16 LAB — CARDIOLIPIN ANTIBODIES, IGG, IGM, IGA
ANTICARDIOLIPIN IGM: 9 [MPL'U]/mL (ref 0–12)
Anticardiolipin IgA: <9 APL U/mL (ref 0–11)

## 2015-08-18 LAB — PROTHROMBIN GENE MUTATION

## 2015-08-18 LAB — FANA STAINING PATTERNS

## 2015-08-18 LAB — FACTOR 5 LEIDEN

## 2015-08-18 LAB — ANTINUCLEAR ANTIBODIES, IFA: ANTINUCLEAR ANTIBODIES, IFA: POSITIVE — AB

## 2015-09-02 ENCOUNTER — Telehealth: Payer: Self-pay | Admitting: Hematology

## 2015-09-02 NOTE — Telephone Encounter (Signed)
DR Irene Limbo COVERING AP 8/23. MOVED NEW PT APPOINTMENT FROM DR Irene Limbo TO DR FENG PER INF MGR. SPOKE WITH PATIENT HE IS AGREEABLE AND HAS NEW APPOINTMENT WITH DR Burr Medico 8/23 @ 1:30 PM TO ARRIVE 1 PM.

## 2015-09-03 ENCOUNTER — Encounter: Payer: Self-pay | Admitting: Hematology

## 2015-09-03 ENCOUNTER — Ambulatory Visit (HOSPITAL_BASED_OUTPATIENT_CLINIC_OR_DEPARTMENT_OTHER): Payer: BLUE CROSS/BLUE SHIELD | Admitting: Hematology

## 2015-09-03 ENCOUNTER — Telehealth: Payer: Self-pay | Admitting: Hematology

## 2015-09-03 VITALS — BP 138/84 | HR 74 | Temp 98.7°F | Resp 17 | Ht 71.0 in | Wt 174.1 lb

## 2015-09-03 DIAGNOSIS — I2699 Other pulmonary embolism without acute cor pulmonale: Secondary | ICD-10-CM | POA: Diagnosis not present

## 2015-09-03 DIAGNOSIS — I10 Essential (primary) hypertension: Secondary | ICD-10-CM | POA: Diagnosis not present

## 2015-09-03 DIAGNOSIS — Z86718 Personal history of other venous thrombosis and embolism: Secondary | ICD-10-CM

## 2015-09-03 DIAGNOSIS — K5090 Crohn's disease, unspecified, without complications: Secondary | ICD-10-CM

## 2015-09-03 NOTE — Progress Notes (Signed)
[place]  [phone] [phone]  Clinic New Consult Note   Patient Care Team: Leighton Ruff, MD as PCP - General 09/03/2015  Referral physician: Gerrit Heck, MD  CHIEF COMPLAINTS/PURPOSE OF CONSULTATION:  Pulmonary embolism (PE)   HISTORY OF PRESENTING ILLNESS:  Frank Dodson 65 y.o. male with PMH of Crohn's disease, hypertension, is here because of his recent episode of pulmonary embolism. He presents to my clinic by himself.  He developed left lobe extremity DVT in 2007 after his left knee injury, but he was not treated with anticoagulation, per patient. He had left knee replacement in 2012, did not have thrombosis after surgery.  He underwent left reverse shoulder arthroplasty for rotator cuff tear on 05/15/2015, and recovered very well and back to his usual physical activities quickly. He was at his usual health's status and activity level, until 08/11/2015 when he developed sudden onset chest pain and dyspnea Patient was subsequently found to have large acute bilateral pulmonary emboli. He was subsequently started on heparin drip but developed coffee-ground hematemesis and melanotic stools. He underwent IVC filter placement 08/13/2015. He has been seen by GI in consultation and EGD was negative for active bleeding or significant lesions. He was discharged home with Xarelto. He is tolerating well.  He denies recent  history of trauma, long distance travel, dehydration, recent surgery, smoking or prolonged immobilization. He had no prior history or diagnosis of cancer. His age appropriate screening programs are up-to-date. There is no family history of blood clots.   MEDICAL HISTORY:  Past Medical History:  Diagnosis Date  . Crohn's colitis ([place])    history of due to surgery  . Decreased range of motion (ROM) of shoulder    "both shoulders; never had a rotator cuff tear; at some point in my life I've sheared both muscles off"  .  Eczema   . Fecal urgency   . Frequent bowel movements    06-28-13  extreme urgency with bowel movements "3-5" per day or more  . GERD (gastroesophageal reflux disease)   . History of gout   . Hypertension   . Iron deficiency anemia   . Osteoarthritis   . Pneumonia ~ 2007  . PONV (postoperative nausea and vomiting)    "with ether"  . Rheumatoid arthritis ([place])   . Rotator cuff tear    left - limited motion of arm    SURGICAL HISTORY: Past Surgical History:  Procedure Laterality Date  . COLECTOMY    . COLONOSCOPY    . CYSTOSCOPY  ~ 2013   "clean out of uric acid deposits"  . ENTEROSTOMY CLOSURE  11/2014  . ESOPHAGOGASTRODUODENOSCOPY    . ESOPHAGOGASTRODUODENOSCOPY N/A 08/14/2015   Procedure: ESOPHAGOGASTRODUODENOSCOPY (EGD);  Surgeon: Clarene Essex, MD;  Location[facility] END[facility]COPY;  Service: Endoscopy;  Laterality: N/A;  . JOINT REPLACEMENT    . LAPAROSCOPIC PARTIAL PROTECTOMY  10/2014   with J pouch  . PERIPHERAL VASCULAR CATHETERIZATION N/A 08/13/2015   Procedure: IVC Filter Insertion;  Surgeon: Waynetta Sandy, MD;  Location: [place] CV LAB;  Service: Cardiovascular;  Laterality: N/A;  . REVERSE SHOULDER ARTHROPLASTY Left 05/15/2015  . REVERSE SHOULDER ARTHROPLASTY Left 05/15/2015   Procedure: LEFT REVERSE SHOULDER ARTHROPLASTY;  Surgeon: Justice Britain, MD;  Location: [place];  Service: Orthopedics;  Laterality: Left;  . 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 TOTAL KNEE ARTHROPLASTY Right 09/04/2013   Procedure: RIGHT TOTAL KNEE ARTHROPLASTY;  Surgeon: Mauri Pole, MD;  Location: [facility];  Service: Orthopedics;  Laterality: Right;  . TOTAL KNEE ARTHROPLASTY Left 2011    SOCIAL HISTORY: Social History   Social History  . Marital status: Married    Spouse name: N/A  . Number of children: N/A  . Years of education: N/A   Occupational History  . Not on file.   Social History Main Topics  . Smoking status: Never Smoker  .  Smokeless tobacco: Never Used  . Alcohol use Yes     Comment: 05/15/2015 "might have 6 beers/month"  . Drug use: No  . Sexual activity: Yes   Other Topics Concern  . Not on file   Social History Narrative  . No narrative on file    FAMILY HISTORY: Family History  Problem Relation Age of Onset  . Subarachnoid hemorrhage Father   . Diabetes Mellitus II Father     ALLERGIES:  has No Known Allergies.  MEDICATIONS:  Current Outpatient Prescriptions  Medication Sig Dispense Refill  . acetaminophen (TYLENOL) 650 MG CR tablet Take 1,300 mg by mouth every 8 (eight) hours as needed for pain.    [name] Kitchen allopurinol (ZYLOPRIM) 100 MG tablet Take 100 mg by mouth 2 (two) times daily.     [name] Kitchen doxazosin (CARDURA) 4 MG tablet Take 4 mg by mouth every evening.    . ferrous fumarate (HEMOCYTE - 106 MG FE) 325 (106 FE) MG TABS tablet Take 1 tablet by mouth 2 (two) times daily.    . hydrOXYzine (ATARAX/VISTARIL) 25 MG tablet Take 50 mg by mouth daily.     . magnesium gluconate (MAGONATE) 500 MG tablet Take 500 mg by mouth daily.    . metroNIDAZOLE (FLAGYL) 500 MG tablet Take 1 tablet (500 mg total) by mouth 3 (three) times daily. 21 tablet 0  . Multiple Vitamin (MULTIVITAMIN WITH MINERALS) TABS tablet Take 1 tablet by mouth daily.    . pantoprazole (PROTONIX) 40 MG tablet Take 1 tablet (40 mg total) by mouth 2 (two) times daily before a meal. 60 tablet 0  . predniSONE (DELTASONE) 5 MG tablet Take 5 mg by mouth daily.     . quinapril (ACCUPRIL) 40 MG tablet Take 40 mg by mouth 2 (two) times daily.     . Rivaroxaban (XARELTO) 15 MG TABS tablet Take 1 tablet (15 mg total) by mouth 2 (two) times daily. 42 tablet 0  . Rivaroxaban 15 & 20 MG TBPK Take as directed on package: Start with one '15mg'$  tablet by mouth twice a day with food. On Day 22, switch to one '20mg'$  tablet once a day with food. 51 each 0  . traMADol (ULTRAM) 50 MG tablet Take 100 mg by mouth 2 (two) times daily.    . vitamin C (ASCORBIC ACID) 500 MG  tablet Take 500 mg by mouth daily.    [name] Kitchen zolpidem (AMBIEN) 10 MG tablet Take 10 mg by mouth at bedtime.      No current facility-administered medications for this visit.     REVIEW OF SYSTEMS:   Constitutional: Denies fevers, chills or abnormal night sweats Eyes: Denies blurriness of vision, double vision or watery eyes Ears, nose, mouth, throat, and face: Denies mucositis or sore throat Respiratory: Denies cough, dyspnea or wheezes Cardiovascular: Denies palpitation, chest discomfort or lower extremity swelling Gastrointestinal:  Denies nausea, heartburn or change in bowel habits Skin: Denies abnormal skin rashes Lymphatics: Denies new lymphadenopathy or easy bruising Neurological:Denies numbness, tingling or new weaknesses Behavioral/Psych: Mood  is stable, no new changes  All other systems were reviewed with the patient and are negative.  PHYSICAL EXAMINATION: ECOG PERFORMANCE STATUS: 0 - Asymptomatic  Vitals:   09/03/15 1324  BP: 138/84  Pulse: 74  Resp: 17  Temp: 98.7 F (37.1 C)   Filed Weights   09/03/15 1324  Weight: 174 lb 1.6 oz (79 kg)    GENERAL:alert, no distress and comfortable SKIN: skin color, texture, turgor are normal, no rashes or significant lesions EYES: normal, conjunctiva are pink and non-injected, sclera clear OROPHARYNX:no exudate, no erythema and lips, buccal mucosa, and tongue normal  NECK: supple, thyroid normal size, non-tender, without nodularity LYMPH:  no palpable lymphadenopathy in the cervical, axillary or inguinal LUNGS: clear to auscultation and percussion with normal breathing effort HEART: regular rate & rhythm and no murmurs and no lower extremity edema ABDOMEN:abdomen soft, non-tender and normal bowel sounds Musculoskeletal:no cyanosis of digits and no clubbing  PSYCH: alert & oriented x 3 with fluent speech NEURO: no focal motor/sensory deficits  LABORATORY DATA:  I have reviewed the data as listed Recent Results (from the past  2160 hour(s))  Basic metabolic panel     Status: Abnormal   Collection Time: 08/11/15  4:11 PM  Result Value Ref Range   Sodium 140 135 - 145 mmol/L   Potassium 3.8 3.5 - 5.1 mmol/L   Chloride 104 101 - 111 mmol/L   CO2 25 22 - 32 mmol/L   Glucose, Bld 138 (H) 65 - 99 mg/dL   BUN 21 (H) 6 - 20 mg/dL   Creatinine, Ser 9.67 (H) 0.61 - 1.24 mg/dL   Calcium 9.2 8.9 - 98.0 mg/dL   GFR calc non Af Amer 56 (L) >60 mL/min   GFR calc Af Amer >60 >60 mL/min    Comment: (NOTE) The eGFR has been calculated using the CKD EPI equation. This calculation has not been validated in all clinical situations. eGFR's persistently <60 mL/min signify possible Chronic Kidney Disease.    Anion gap 11 5 - 15  CBC     Status: Abnormal   Collection Time: 08/11/15  4:11 PM  Result Value Ref Range   WBC 10.8 (H) 4.0 - 10.5 K/uL   RBC 3.74 (L) 4.22 - 5.81 MIL/uL   Hemoglobin 11.7 (L) 13.0 - 17.0 g/dL   HCT 91.3 (L) 83.9 - 19.7 %   MCV 95.7 78.0 - 100.0 fL   MCH 31.3 26.0 - 34.0 pg   MCHC 32.7 30.0 - 36.0 g/dL   RDW 37.6 22.3 - 31.7 %   Platelets 272 150 - 400 K/uL  Differential     Status: Abnormal   Collection Time: 08/11/15  4:11 PM  Result Value Ref Range   Neutrophils Relative % 57 %   Neutro Abs 5.9 1.7 - 7.7 K/uL   Lymphocytes Relative 29 %   Lymphs Abs 2.9 0.7 - 4.0 K/uL   Monocytes Relative 13 %   Monocytes Absolute 1.3 (H) 0.1 - 1.0 K/uL   Eosinophils Relative 1 %   Eosinophils Absolute 0.1 0.0 - 0.7 K/uL   Basophils Relative 0 %   Basophils Absolute 0.0 0.0 - 0.1 K/uL  Hepatic function panel     Status: Abnormal   Collection Time: 08/11/15  4:11 PM  Result Value Ref Range   Total Protein 8.3 (H) 6.5 - 8.1 g/dL   Albumin 4.5 3.5 - 5.0 g/dL   AST 29 15 - 41 U/L   ALT 27 17 - 63  U/L   Alkaline Phosphatase 68 38 - 126 U/L   Total Bilirubin 0.6 0.3 - 1.2 mg/dL   Bilirubin, Direct <0.1 (L) 0.1 - 0.5 mg/dL   Indirect Bilirubin NOT CALCULATED 0.3 - 0.9 mg/dL  Lipase, blood     Status: None    Collection Time: 08/11/15  4:11 PM  Result Value Ref Range   Lipase 43 11 - 51 U/L  I-stat troponin, ED     Status: None   Collection Time: 08/11/15  4:22 PM  Result Value Ref Range   Troponin i, poc 0.00 0.00 - 0.08 ng/mL   Comment 3            Comment: Due to the release kinetics of cTnI, a negative result within the first hours of the onset of symptoms does not rule out myocardial infarction with certainty. If myocardial infarction is still suspected, repeat the test at appropriate intervals.   I-stat Creatinine, ED     Status: None   Collection Time: 08/11/15  4:31 PM  Result Value Ref Range   Creatinine, Ser 1.20 0.61 - 1.24 mg/dL  D-dimer, quantitative (not at [facility])     Status: Abnormal   Collection Time: 08/11/15  4:31 PM  Result Value Ref Range   D-Dimer, Quant 5.56 (H) 0.00 - 0.50 ug/mL-FEU    Comment: (NOTE) At the manufacturer cut-off of 0.50 ug/mL FEU, this assay has been documented to exclude PE with a sensitivity and negative predictive value of 97 to 99%.  At this time, this assay has not been approved by the FDA to exclude DVT/VTE. Results should be correlated with clinical presentation.   I-Stat CG4 Lactic Acid, ED     Status: Abnormal   Collection Time: 08/11/15  4:32 PM  Result Value Ref Range   Lactic Acid, Venous 2.90 (HH) 0.5 - 1.9 mmol/L   Comment NOTIFIED PHYSICIAN   APTT     Status: Abnormal   Collection Time: 08/11/15  6:24 PM  Result Value Ref Range   aPTT 177 (HH) 24 - 36 seconds    Comment:        IF BASELINE aPTT IS ELEVATED, SUGGEST PATIENT RISK ASSESSMENT BE USED TO DETERMINE APPROPRIATE ANTICOAGULANT THERAPY. REPEATED TO VERIFY CRITICAL RESULT CALLED TO, Dodson BACK BY AND VERIFIED WITH: Jamal Maes [id number] @ 1904 BY J SCOTTON   Fibrinogen     Status: Abnormal   Collection Time: 08/11/15  6:24 PM  Result Value Ref Range   Fibrinogen 654 (H) 210 - 475 mg/dL  Platelet count     Status: None   Collection Time: 08/11/15  6:24 PM    Result Value Ref Range   Platelets 240 150 - 400 K/uL  Save smear     Status: None   Collection Time: 08/11/15  6:24 PM  Result Value Ref Range   Smear Review SMEAR STAINED AND AVAILABLE FOR REVIEW   Antinuclear Antibodies, IFA     Status: Abnormal   Collection Time: 08/11/15  8:36 PM  Result Value Ref Range   ANA Ab, IFA Positive (A)     Comment: (NOTE)                                     Negative   <1:80  Borderline  1:80                                     Positive   >1:80 Performed At: [facility] [place], [place] [id number] Lindon Romp MD [id number]   Antithrombin III     Status: None   Collection Time: 08/11/15  8:36 PM  Result Value Ref Range   AntiThromb III Func 111 75 - 120 %    Comment: Performed at [place] C activity     Status: None   Collection Time: 08/11/15  8:36 PM  Result Value Ref Range   Protein C Activity 157 73 - 180 %    Comment: (NOTE) Performed At: [facility] [address] [place], [place] [id number] Lindon Romp MD [id number]   Protein C, total     Status: None   Collection Time: 08/11/15  8:36 PM  Result Value Ref Range   Protein C, Total 105 60 - 150 %    Comment: (NOTE) Performed At: [facility] [place], [place] [id number] Lindon Romp MD IH:[id number]   Protein S activity     Status: None   Collection Time: 08/11/15  8:36 PM  Result Value Ref Range   Protein S Activity 128 63 - 140 %    Comment: (NOTE) Protein S activity may be falsely increased (masking an abnormal, low result) in patients receiving direct Xa inhibitor (e.g., rivaroxaban, apixaban, edoxaban) or a direct thrombin inhibitor (e.g., dabigatran) anticoagulant treatment due to assay interference by these drugs. Performed At: [facility] [place], [place] [id number] Lindon Romp MD [id number]   Protein S,  total     Status: Abnormal   Collection Time: 08/11/15  8:36 PM  Result Value Ref Range   Protein S Ag, Total 175 (H) 60 - 150 %    Comment: (NOTE) This test was developed and its performance characteristics determined by [facility]. It has not been cleared or approved by the Food and Drug Administration. Total Protein S Antigen is an acute phase reactant protein and can be elevated in inflammatory states. Performed At: [facility] [place], [place] [id number] Lindon Romp MD [id number]   Lupus anticoagulant panel     Status: Abnormal   Collection Time: 08/11/15  8:36 PM  Result Value Ref Range   PTT Lupus Anticoagulant 56.5 (H) 0.0 - 51.9 sec    Comment: (NOTE) Additional testing confirms the presence of heparin in the test sample. Results obtained after heparin neutralization.    DRVVT 101.9 (H) 0.0 - 47.0 sec   Lupus Anticoag Interp Comment:     Comment: (NOTE) Results are consistent with the presence of a lupus anticoagulant. NOTE: Only persistent lupus anticoagulants are thought to be of clinical significance. For this reason, repeat testing in 12 or more weeks after an initial positive result should be considered to confirm or refute the presence of a lupus anticoagulant, depending on clinical presentation. Results of lupus anticoagulant tests may be falsely positive in the presence of certain anticoagulant therapies. Performed At: [facility] [place], [place] [id number] Lindon Romp MD [id number]   Beta-2-glycoprotein i abs, IgG/M/A     Status: None   Collection Time: 08/11/15  8:36 PM  Result Value Ref Range   Beta-2 Glyco I IgG <9 0 -  20 GPI IgG units    Comment: (NOTE) The reference interval reflects a 3SD or 99th percentile interval, which is thought to represent a potentially clinically significant result in accordance with the International Consensus Statement on the classification criteria for  definitive antiphospholipid syndrome (APS). J Thromb Haem 2006;4:295-306.    Beta-2-Glycoprotein I IgM <9 0 - 32 GPI IgM units    Comment: (NOTE) The reference interval reflects a 3SD or 99th percentile interval, which is thought to represent a potentially clinically significant result in accordance with the International Consensus Statement on the classification criteria for definitive antiphospholipid syndrome (APS). J Thromb Haem 2006;4:295-306. Performed At: [facility] [place], [place] [id number] Lindon Romp MD [id number]:[id number]    Beta-2-Glycoprotein I IgA <9 0 - 25 GPI IgA units    Comment: (NOTE) The reference interval reflects a 3SD or 99th percentile interval, which is thought to represent a potentially clinically significant result in accordance with the International Consensus Statement on the classification criteria for definitive antiphospholipid syndrome (APS). J Thromb Haem 2006;4:295-306.   Homocysteine, serum     Status: None   Collection Time: 08/11/15  8:36 PM  Result Value Ref Range   Homocysteine 10.0 0.0 - 15.0 umol/L    Comment: (NOTE) Performed At: [facility] [place], [place] [id number] Lindon Romp MD KG:[id number]   Factor 5 leiden     Status: None   Collection Time: 08/11/15  8:36 PM  Result Value Ref Range   Recommendations-F5LEID: Comment     Comment: (NOTE) Result:  Negative (no mutation found) Factor V Leiden is a specific mutation (R506Q) in the factor V gene that is associated with an increased risk of venous thrombosis. Factor V Leiden is more resistant to inactivation by activated protein C.  As a result, factor V persists in the circulation leading to a mild hyper- coagulable state.  The Leiden mutation accounts for 90% - 95% of APC resistance.  Factor V Leiden has been reported in patients with deep vein thrombosis, pulmonary embolus, central retinal vein occlusion, cerebral sinus  thrombosis and hepatic vein thrombosis. Other risk factors to be considered in the workup for venous thrombosis include the G20210A mutation in the factor II (prothrombin) gene, protein S and C deficiency, and antithrombin deficiencies. Anticardiolipin antibody and lupus anticoagulant analysis may be appropriate for certain patients, as well as homocysteine levels. Contact your local [facility] for information on how to order additi onal testing if desired.    Comment Comment     Comment: (NOTE) **Genetic counselors are available for health care providers to**  discuss results at [phone]-GENE [phone]). Methodology: DNA analysis of the Factor V gene was performed by allele-specific PCR. The diagnostic sensitivity and specificity is >99% for both. Molecular-based testing is highly accurate, but as in any laboratory test, diagnostic errors may occur. All test results must be combined with clinical information for the most accurate interpretation. This test was developed and its performance characteristics determined by [facility]. It has not been cleared or approved by the Food and Drug Administration. References: Voelkerding K (1996).  Clin Lab Med [phone]. Allison Quarry, PhD, [facility] Ruben Reason, PhD, [facility] Jens Som, PhD, [facility] Annetta Maw, M.S., PhD, [facility] Alfredo Bach, PhD, [facility] Norva Riffle, PhD, [facility] Earlean Polka PhD, [facility] Performed At: [facility] [place], [place] [id number] Nelda Marseille [id number]   Prothrombin gene mutation     Status: None  Collection Time: 08/11/15  8:36 PM  Result Value Ref Range   Recommendations-PTGENE: Comment     Comment: (NOTE) NEGATIVE No mutation identified. Comment: A point mutation (G20210A) in the factor II (prothrombin) gene is the second most common cause of inherited thrombophilia. The incidence of this mutation in the [place] Caucasian population is about 2% and in the  [place] American population it is approximately 0.5%. This mutation is rare in the [place] and Native American population. Being heterozygous for a prothrombin mutation increases the risk for developing venous thrombosis about 2 to 3 times above the general population risk. Being homozygous for the prothrombin gene mutation increases the relative risk for venous thrombosis further, although it is not yet known how much further the risk is increased. In women heterozygous for the prothrombin gene mutation, the use of estrogen containing oral contraceptives increases the relative risk of venous thrombosis about 16 times and the risk of developing cerebral thrombosis is also significantly increased. In pregnancy the pr othrombin gene mutation increases risk for venous thrombosis and may increase risk for stillbirth, placental abruption, pre-eclampsia and fetal growth restriction. If the patient possesses two or more congenital or acquired thrombophilic risk factors, the risk for thrombosis may rise to more than the sum of the risk ratios for the individual mutations. This assay detects only the prothrombin G20210A mutation and does not measure genetic abnormalities elsewhere in the genome. Other thrombotic risk factors may be pursued through systematic clinical laboratory analysis. These factors include the R506Q (Leiden) mutation in the Factor V gene, plasma homocysteine levels, as well as testing for deficiencies of antithrombin III, protein C and protein S.    Additional Information Comment     Comment: (NOTE) Genetic Counselors are available for health care providers to discuss results at 1-800-345-GENE [phone]). Methodology: DNA analysis of the Factor II gene was performed by PCR amplification followed by restriction analysis. The diagnostic sensitivity is >99% for both. All the tests must be combined with clinical information for the most accurate interpretation. Molecular-based  testing is highly accurate, but as in any laboratory test, diagnostic errors may occur. This test was developed and its performance characteristics determined by [facility]. It has not been cleared or approved by the Food and Drug Administration. Poort SR, et al. Blood. 1996; 35:0093-8182. Varga EA. Circulation. 2004; 993:Z16-R67. Mervin Hack, et [place]; 19:700-703. Allison Quarry, PhD, [facility] Ruben Reason, PhD, [facility] Jens Som, PhD, [facility] Annetta Maw, M.S., PhD, [facility] Alfredo Bach, PhD, [facility] Norva Riffle, PhD, [facility] Earlean Polka,  PhD, [facility] Performed At: [facility] [place], [place] [id number] Nechama Guard MD ZW:[id number]   Cardiolipin antibodies, IgG, IgM, IgA     Status: None   Collection Time: 08/11/15  8:36 PM  Result Value Ref Range   Anticardiolipin IgG <9 0 - 14 GPL U/mL    Comment: (NOTE)                          Negative:              <15                          Indeterminate:     15 - 20                          Low-Med Positive: >  20 - 80                          High Positive:         >80    Anticardiolipin IgM 9 0 - 12 MPL U/mL    Comment: (NOTE)                          Negative:              <13                          Indeterminate:     13 - 20                          Low-Med Positive: >20 - 80                          High Positive:         >80    Anticardiolipin IgA <9 0 - 11 APL U/mL    Comment: (NOTE)                          Negative:              <12                          Indeterminate:     12 - 20                          Low-Med Positive: >20 - 80                          High Positive:         >80 Performed At: [facility] [address] [place], [place] [id number] Mila Homer MD IO:[id number]   PTT-LA Mix     Status: Abnormal   Collection Time: 08/11/15  8:36 PM  Result Value Ref Range   PTT-LA Mix 55.3 (H) 0.0 - 48.9 sec    Comment:  (NOTE) Performed At: [facility] [address] [place], [place] [id number] Mila Homer MD FO:[id number]   Hexagonal Phase Phospholipid     Status: Abnormal   Collection Time: 08/11/15  8:36 PM  Result Value Ref Range   Hexagonal Phase Phospholipid 22 (H) 0 - 11 sec    Comment: (NOTE) Performed At: [facility] [address] [place], [place] [id number] Mila Homer MD OX:[id number]   dRVVT Mix     Status: Abnormal   Collection Time: 08/11/15  8:36 PM  Result Value Ref Range   dRVVT Mix 61.8 (H) 0.0 - 47.0 sec    Comment: (NOTE) Performed At: [facility] [address] [place], [place] [id number] Mila Homer MD YF:[id number]   dRVVT Confirm     Status: Abnormal   Collection Time: 08/11/15  8:36 PM  Result Value Ref Range   dRVVT Confirm 1.9 (H) 0.8 - 1.2 ratio    Comment: (NOTE) Performed At: [facility] [address] [place], [place] [id number] Mila Homer MD [id number]   FANA Staining Patterns     Status: None   Collection Time: 08/11/15  8:36 PM  Result Value Ref Range   Homogeneous Pattern 1:80    Note: Comment     Comment: (NOTE) A positive ANA result may occur in healthy individuals (low titer) or be associated with a variety of diseases.  See interpretation chart which is not all inclusive: Pattern      Antigen Detected  Suggested Disease Association -----------  ----------------  ----------------------------- Homogeneous  DNA(ds,ss),       SLE - High titers             Nucleosomes,             Histones          Drug-induced SLE -----------  ----------------  ----------------------------- Speckled     Sm, RNP, SCL-70,  SLE,MCTD,PSS (diffuse form),             SS-A/SS-B         Sjogrens -----------  ----------------  ----------------------------- Nucleolar    SCL-70, PM-1/SCL  High titers Scleroderma,                               PM/DM -----------  ----------------   ----------------------------- Centromere   Centromere        PSS (limited form) w/Crest                               syndrome variable -----------  ----------------  ----------------------------- Nuclear Dot  Sp100,p80-c oilin  Primary Biliary Cirrhosis -----------  ----------------  ----------------------------- Nuclear      GP210,            Primary Biliary Cirrhosis Membrane     lamin A,B,C -----------  ----------------  ----------------------------- Performed At: [facility] [place], [place] [id number] Lindon Romp MD [id number]   Heparin level (unfractionated)     Status: None   Collection Time: 08/12/15 12:19 AM  Result Value Ref Range   Heparin Unfractionated 0.48 0.30 - 0.70 IU/mL    Comment:        IF HEPARIN RESULTS ARE BELOW EXPECTED VALUES, AND PATIENT DOSAGE HAS BEEN CONFIRMED, SUGGEST FOLLOW UP TESTING OF ANTITHROMBIN III LEVELS.   Protime-INR     Status: None   Collection Time: 08/12/15  6:40 AM  Result Value Ref Range   Prothrombin Time 14.7 11.4 - 15.2 seconds   INR 1.14   CBC     Status: Abnormal   Collection Time: 08/12/15  6:40 AM  Result Value Ref Range   WBC 10.4 4.0 - 10.5 K/uL   RBC 3.35 (L) 4.22 - 5.81 MIL/uL   Hemoglobin 10.8 (L) 13.0 - 17.0 g/dL   HCT 31.9 (L) 39.0 - 52.0 %   MCV 95.2 78.0 - 100.0 fL   MCH 32.2 26.0 - 34.0 pg   MCHC 33.9 30.0 - 36.0 g/dL   RDW 14.1 11.5 - 15.5 %   Platelets 224 150 - 400 K/uL  Basic metabolic panel     Status: Abnormal   Collection Time: 08/12/15  6:40 AM  Result Value Ref Range   Sodium 138 135 - 145 mmol/L   Potassium 4.2 3.5 - 5.1 mmol/L   Chloride 101 101 - 111 mmol/L   CO2 27 22 - 32 mmol/L   Glucose, Bld 131 (H) 65 - 99 mg/dL   BUN 24 (H) 6 - 20 mg/dL   Creatinine, Ser 1.24 0.61 -  1.24 mg/dL   Calcium 8.7 (L) 8.9 - 10.3 mg/dL   GFR calc non Af Amer >60 >60 mL/min   GFR calc Af Amer >60 >60 mL/min    Comment: (NOTE) The eGFR has been calculated using the CKD EPI  equation. This calculation has not been validated in all clinical situations. eGFR's persistently <60 mL/min signify possible Chronic Kidney Disease.    Anion gap 10 5 - 15  Heparin level (unfractionated)     Status: None   Collection Time: 08/12/15 11:54 AM  Result Value Ref Range   Heparin Unfractionated 0.34 0.30 - 0.70 IU/mL    Comment:        IF HEPARIN RESULTS ARE BELOW EXPECTED VALUES, AND PATIENT DOSAGE HAS BEEN CONFIRMED, SUGGEST FOLLOW UP TESTING OF ANTITHROMBIN III LEVELS.   Echocardiogram     Status: None   Collection Time: 08/12/15 12:41 PM  Result Value Ref Range   Weight 2,934.76 oz   Height 71 in   BP 165/100 mmHg  Protime-INR     Status: None   Collection Time: 08/13/15  5:36 AM  Result Value Ref Range   Prothrombin Time 14.0 11.4 - 15.2 seconds   INR 1.07   Heparin level (unfractionated)     Status: Abnormal   Collection Time: 08/13/15  5:36 AM  Result Value Ref Range   Heparin Unfractionated 0.14 (L) 0.30 - 0.70 IU/mL    Comment:        IF HEPARIN RESULTS ARE BELOW EXPECTED VALUES, AND PATIENT DOSAGE HAS BEEN CONFIRMED, SUGGEST FOLLOW UP TESTING OF ANTITHROMBIN III LEVELS.   CBC     Status: Abnormal   Collection Time: 08/13/15  5:36 AM  Result Value Ref Range   WBC 9.0 4.0 - 10.5 K/uL   RBC 3.21 (L) 4.22 - 5.81 MIL/uL   Hemoglobin 10.3 (L) 13.0 - 17.0 g/dL   HCT 30.5 (L) 39.0 - 52.0 %   MCV 95.0 78.0 - 100.0 fL   MCH 32.1 26.0 - 34.0 pg   MCHC 33.8 30.0 - 36.0 g/dL   RDW 14.0 11.5 - 15.5 %   Platelets 250 150 - 400 K/uL  Occult blood gastric / duodenum     Status: Abnormal   Collection Time: 08/13/15  7:41 AM  Result Value Ref Range   pH, Gastric 2    Occult Blood, Gastric POSITIVE (A) NEGATIVE  CBC     Status: Abnormal   Collection Time: 08/13/15 10:23 AM  Result Value Ref Range   WBC 9.1 4.0 - 10.5 K/uL   RBC 3.11 (L) 4.22 - 5.81 MIL/uL   Hemoglobin 10.0 (L) 13.0 - 17.0 g/dL   HCT 29.2 (L) 39.0 - 52.0 %   MCV 93.9 78.0 - 100.0 fL    MCH 32.2 26.0 - 34.0 pg   MCHC 34.2 30.0 - 36.0 g/dL   RDW 14.0 11.5 - 15.5 %   Platelets 230 150 - 400 K/uL  Protime-INR     Status: Abnormal   Collection Time: 08/14/15  3:21 AM  Result Value Ref Range   Prothrombin Time 15.3 (H) 11.4 - 15.2 seconds   INR 1.20   CBC     Status: Abnormal   Collection Time: 08/14/15 11:58 AM  Result Value Ref Range   WBC 6.0 4.0 - 10.5 K/uL   RBC 2.96 (L) 4.22 - 5.81 MIL/uL   Hemoglobin 9.2 (L) 13.0 - 17.0 g/dL   HCT 28.2 (L) 39.0 - 52.0 %   MCV 95.3  78.0 - 100.0 fL   MCH 31.1 26.0 - 34.0 pg   MCHC 32.6 30.0 - 36.0 g/dL   RDW 13.8 11.5 - 15.5 %   Platelets 223 150 - 400 K/uL  C difficile quick scan w PCR reflex     Status: Abnormal   Collection Time: 08/14/15  1:15 PM  Result Value Ref Range   C Diff antigen POSITIVE (A) NEGATIVE   C Diff toxin NEGATIVE NEGATIVE   C Diff interpretation Results are indeterminate. See PCR results.   Clostridium Difficile by PCR     Status: Abnormal   Collection Time: 08/14/15  1:15 PM  Result Value Ref Range   Toxigenic C Difficile by pcr POSITIVE (A) NEGATIVE  CBC     Status: Abnormal   Collection Time: 08/14/15  9:22 PM  Result Value Ref Range   WBC 4.9 4.0 - 10.5 K/uL   RBC 2.75 (L) 4.22 - 5.81 MIL/uL   Hemoglobin 8.4 (L) 13.0 - 17.0 g/dL   HCT 26.0 (L) 39.0 - 52.0 %   MCV 94.5 78.0 - 100.0 fL   MCH 30.5 26.0 - 34.0 pg   MCHC 32.3 30.0 - 36.0 g/dL   RDW 13.8 11.5 - 15.5 %   Platelets 234 150 - 400 K/uL  Protime-INR     Status: Abnormal   Collection Time: 08/15/15  3:29 AM  Result Value Ref Range   Prothrombin Time 15.3 (H) 11.4 - 15.2 seconds   INR 1.20   CBC     Status: Abnormal   Collection Time: 08/15/15  9:30 AM  Result Value Ref Range   WBC 5.8 4.0 - 10.5 K/uL   RBC 2.84 (L) 4.22 - 5.81 MIL/uL   Hemoglobin 8.8 (L) 13.0 - 17.0 g/dL   HCT 27.3 (L) 39.0 - 52.0 %   MCV 96.1 78.0 - 100.0 fL   MCH 31.0 26.0 - 34.0 pg   MCHC 32.2 30.0 - 36.0 g/dL   RDW 13.6 11.5 - 15.5 %   Platelets 259 150 -  400 K/uL    RADIOGRAPHIC STUDIES: I have personally reviewed the radiological images as listed and agreed with the findings in the report. Ct Angio Chest/abd/pel For Dissection W And/or Wo Contrast  Result Date: 08/11/2015 CLINICAL DATA:  Pt began to have central CP yesterday. Today while at lunch pain began to get worse suddenly. Accompanied by SOB. Pain radiates to L side of abd. EXAM: CT ANGIO CHEST-ABD-PELV FOR DISSECTION W/ AND WO/W CM<Procedure Description>CT ANGIO CHEST-ABD-PELV FOR DISSECTION W/ AND WO/W CM TECHNIQUE: Unenhanced CT imaging of the chest was performed. CT imaging was then acquired through the chest, abdomen and pelvis following the dynamic injection of 100 mL of Isovue 370 intravenous contrast. Computer-generated coronal and sagittal MIP and MPR images were also obtained. CONTRAST:  100 mL of Isovue 370 intravenous contrast. COMPARISON:  Chest radiograph, 06/28/2013. Abdomen and pelvis CT, 03/23/2012. None. FINDINGS: CT CHEST ANGIOGRAPHIC FINDINGS: Thoracic aorta is normal in caliber. There is minor atherosclerotic calcifications along the arch and at the origin of the left subclavian artery and in the innominate artery. a there are bilateral pulmonary emboli. On the left, near occlusive embolus is seen in the lower lobe pulmonary artery extending into segmental branches. On the right, new pulmonary embolus straddles the right middle lobe and lower lobe pulmonary arteries. Segmental branch emboli are noted in the right lower lobe and right middle lobe. Possible emboli are noted in the right upper lobe segmental and subsegmental branches.  NON ANGIOGRAPHIC FINDINGS: Neck base and axilla:  No mass or adenopathy. Mediastinum and hila: Heart normal in size and configuration. No significant coronary artery calcifications. No mediastinal or hilar masses or enlarged lymph nodes. Lungs and pleura: Patchy airspace opacity is noted in the lower lobes, left greater than right. Mild prominence of  the vascular markings are noted bilaterally. Minimal left pleural effusion. No pneumothorax. CT ABDOMEN PELVIS ANGIOGRAPHIC FINDINGS: The abdominal aorta is normal in caliber. No dissection. No atherosclerotic plaque. The celiac axis, superior mesenteric artery single bilateral renal arteries and the inferior mesenteric artery are widely patent. There is minor partly calcified plaque along the left common iliac artery and at the bifurcation of the right common iliac artery and in both internal iliac arteries. Plaque is noted in the common femoral arteries, greater on the right. No significant stenosis. There is nonocclusive thrombus in the superior mesenteric vein centrally. NON ANGIOGRAPHIC FINDINGS: Liver, spleen, gallbladder, pancreas, adrenal glands:  Unremarkable. Kidneys, ureters, bladder: Mild renal cortical thinning. Nonobstructing stone and in the lower pole the right kidney. No renal masses. No hydronephrosis. Normal ureters. Normal bladder. Lymph nodes:  No adenopathy. Gastrointestinal: Small hiatal hernia. Stomach otherwise unremarkable. Normal small bowel. There is a rectosigmoid anastomosis staple line. Another staple line is noted involving the right colon. There is no colon dilation, wall thickening or adjacent inflammation. MUSCULOSKELETAL FINDINGS: There are degenerative changes of the spine mostly the lumbar spine. Bones are demineralized. There are no osteoblastic or osteolytic lesions. IMPRESSION: CT CHEST IMPRESSION 1. Bilateral pulmonary emboli, most prominently in the left lower lobe. There is left greater than right lower lobe airspace opacity that is likely atelectasis. Early pulmonary infarction is not excluded. 2. Essentially normal thoracoabdominal aorta.  No dissection. CT ABDOMEN AND PELVIS IMPRESSION 1. Normal appearance of the abdominal aorta. No dissection. Branch vessels are widely patent. 2. Nonocclusive thrombus noted in the superior mesenteric vein. 3. No other acute findings in  the abdomen or pelvis. Electronically Signed   By: Lajean Manes M.D.   On: 08/11/2015 17:10    ASSESSMENT: 65 year old male, with past medical history of Crohn's disease, hypertension, left lower extremity DVT after a knee injury in 2007, now presented with unprovoked PE  1. Recurrent DVT/PE  PLAN:  I reviewed with the patient about the plan for care for recurrent DVT/PE.  Hypercoagulopathy workup We discussed about the pros and cons about testing for thrombophilia disorder. his current anticoagulation therapy will interfere with some the tests and it is not possible to interpret the test results. Taking him off the anticoagulation therapy to do the tests may precipitate another thrombotic event. He has been tested negative for prothrombin gene mutation and factor V Leiden which were not interfered by his anticoagulation. His work up was positive for lupus anticoagulant. We discussed that this needs to be confirmed with a repeat lab in 3 months later. If the repeat test is still positive, then he has hypercoagulopathy and will require lifelong anticoagulation. Giving her prior history of provoked DVT, and severity of her current DVT/PE, I would recommend lifelong anticoagulation even if his repeated lupus anticoagulant is negative.  Duration of anticoagulation Giving the severity of his thrombosis, and positive lupus anticoagulant, I recommend continue Xarelto.  Anticoagulation options We discussed about various options of anticoagulation therapies including warfarin, low molecular weight heparin such as Lovenox or newer agents such as Rivaroxaban. Some of the risks and benefits discussed including costs involved, the need for monitoring, risks of life-threatening bleeding/hospitalization, reversibility  of each agent in the event of bleeding or overdose, safety profile of each drug and taking into account other social issues such as ease of administration of medications, etc. Ultimately, we have  made an informed decision for the patient to continue his treatment with Xarelto. However due to his history of GI bleeding and Crohn's disease, if he has recurrent GI bleeding issue, I would recommend him to switch to Coumadin, which is much more reversible. Patient is agreeable with the plan.   Other preventive strategy for DVT  I recommend the patient to use elastic compression stockings at 20-30 mmHg to reduce risks of chronic thrombophlebitis.  Finally, at the end of our consultation today, I reinforced the importance of preventive strategies such as avoiding hormonal supplement, avoiding cigarette smoking, keeping up-to-date with screening programs for early cancer detection, frequent ambulation for long distance travel and aggressive DVT prophylaxis in all surgical settings.  Family counseling Another main issue we discussed today included the role of screening other family members for thrombophilia disorder. At present time, I would not recommend testing the patient's family members as it would not benefit them.   Plan -Continue Xarelto indefinitely. He is going to switch to 20 mg daily maintenance dose later this week. -If he has recurrent GI bleeding, I recommend him to switch to Coumadin, which is more reversible. He is agreeable -I'll see him back in 4 months with repeat labs including lupus anticoagulant.  All questions were answered. The patient knows to call the clinic with any problems, questions or concerns. I spent 40 minutes counseling the patient face to face. The total time spent in the appointment was 50 minutes and more than 50% was on counseling.     Truitt Merle, MD 09/03/2015 1:26 PM

## 2015-09-03 NOTE — Telephone Encounter (Signed)
GAVE PATIENT AVS REPORT AND APPOINTMENTS FOR December  °

## 2015-09-07 ENCOUNTER — Encounter: Payer: Self-pay | Admitting: Hematology

## 2015-09-11 ENCOUNTER — Encounter: Payer: Self-pay | Admitting: Vascular Surgery

## 2015-09-12 ENCOUNTER — Encounter: Payer: Self-pay | Admitting: Vascular Surgery

## 2015-09-12 ENCOUNTER — Ambulatory Visit (INDEPENDENT_AMBULATORY_CARE_PROVIDER_SITE_OTHER): Payer: Self-pay | Admitting: Vascular Surgery

## 2015-09-12 VITALS — BP 126/77 | HR 84 | Temp 97.9°F | Resp 16 | Ht 71.0 in | Wt 177.0 lb

## 2015-09-12 DIAGNOSIS — I2699 Other pulmonary embolism without acute cor pulmonale: Secondary | ICD-10-CM

## 2015-09-12 NOTE — H&P (Signed)
Patient ID: Frank Dodson, male   DOB: 1950/05/18, 65 y.o.   MRN: [id number]  Reason for Consult: Re-evaluation (f/u inferior vena cava filter )   Referred by Leighton Ruff, MD  Subjective:     HPI:  Frank Dodson is a 65 y.o. male status post placement of IVC filter for PE with upper GI bleed while inpatient. He has ultimately been transitioned to relative which she is tolerating well and has had no further bleeding. He also denies any blood from his rectum has a history of Crohn's and is status post colectomy. He is here to discuss IVC filter removal today.  Past Medical History:  Diagnosis Date  . Crohn's colitis ([place])    history of due to surgery  . Decreased range of motion (ROM) of shoulder    "both shoulders; never had a rotator cuff tear; at some point in my life I've sheared both muscles off"  . Eczema   . Fecal urgency   . Frequent bowel movements    06-28-13  extreme urgency with bowel movements "3-5" per day or more  . GERD (gastroesophageal reflux disease)   . History of gout   . Hypertension   . Iron deficiency anemia   . Osteoarthritis   . Pneumonia ~ 2007  . PONV (postoperative nausea and vomiting)    "with ether"  . Rheumatoid arthritis ([place])   . Rotator cuff tear    left - limited motion of arm   Family History  Problem Relation Age of Onset  . Subarachnoid hemorrhage Father   . Diabetes Mellitus II Father    Past Surgical History:  Procedure Laterality Date  . COLECTOMY    . COLONOSCOPY    . CYSTOSCOPY  ~ 2013   "clean out of uric acid deposits"  . ENTEROSTOMY CLOSURE  11/2014  . ESOPHAGOGASTRODUODENOSCOPY    . ESOPHAGOGASTRODUODENOSCOPY N/A 08/14/2015   Procedure: ESOPHAGOGASTRODUODENOSCOPY (EGD);  Surgeon: Clarene Essex, MD;  Location: [facility] ENDOSCOPY;  Service: Endoscopy;  Laterality: N/A;  . JOINT REPLACEMENT    . LAPAROSCOPIC PARTIAL PROTECTOMY  10/2014   with J pouch  . PERIPHERAL VASCULAR CATHETERIZATION N/A 08/13/2015   Procedure: IVC Filter  Insertion;  Surgeon: Waynetta Sandy, MD;  Location: [facility] CV LAB;  Service: Cardiovascular;  Laterality: N/A;  . REVERSE SHOULDER ARTHROPLASTY Left 05/15/2015  . REVERSE SHOULDER ARTHROPLASTY Left 05/15/2015   Procedure: LEFT REVERSE SHOULDER ARTHROPLASTY;  Surgeon: Justice Britain, MD;  Location: [facility];  Service: Orthopedics;  Laterality: Left;  . 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 TOTAL KNEE ARTHROPLASTY Right 09/04/2013   Procedure: RIGHT TOTAL KNEE ARTHROPLASTY;  Surgeon: Mauri Pole, MD;  Location: [facility] ORS;  Service: Orthopedics;  Laterality: Right;  . TOTAL KNEE ARTHROPLASTY Left 2011    Short Social History:  Social History  Substance Use Topics  . Smoking status: Never Smoker  . Smokeless tobacco: Never Used  . Alcohol use Yes     Comment: 05/15/2015 "might have 6 beers/month"    No Known Allergies  Current Outpatient Prescriptions  Medication Sig Dispense Refill  . acetaminophen (TYLENOL) 650 MG CR tablet Take 1,300 mg by mouth every 8 (eight) hours as needed for pain.    [name] Kitchen allopurinol (ZYLOPRIM) 100 MG tablet Take 100 mg by mouth 2 (two) times daily.     [name] Kitchen doxazosin (CARDURA) 4 MG tablet Take 4 mg by mouth every evening.    [name] Kitchen  ferrous fumarate (HEMOCYTE - 106 MG FE) 325 (106 FE) MG TABS tablet Take 1 tablet by mouth 2 (two) times daily.    . hydrOXYzine (ATARAX/VISTARIL) 25 MG tablet Take 50 mg by mouth daily.     [name] Kitchen loperamide (IMODIUM) 2 MG capsule 8 mg daily.    . magnesium gluconate (MAGONATE) 500 MG tablet Take 500 mg by mouth daily.    . Multiple Vitamin (MULTIVITAMIN WITH MINERALS) TABS tablet Take 1 tablet by mouth daily.    . pantoprazole (PROTONIX) 40 MG tablet Take 1 tablet (40 mg total) by mouth 2 (two) times daily before a meal. 60 tablet 0  . predniSONE (DELTASONE) 5 MG tablet Take 5 mg by mouth daily.     . quinapril (ACCUPRIL) 40 MG tablet Take 40 mg by mouth 2 (two) times daily.     . Rivaroxaban  (XARELTO) 15 MG TABS tablet Take 1 tablet (15 mg total) by mouth 2 (two) times daily. (Patient taking differently: Take 20 mg by mouth daily. ) 42 tablet 0  . saccharomyces boulardii (FLORASTOR) 250 MG capsule Take 250 mg by mouth daily.    . traMADol (ULTRAM) 50 MG tablet Take 100 mg by mouth 2 (two) times daily.    . vitamin C (ASCORBIC ACID) 500 MG tablet Take 500 mg by mouth daily.    [name] Kitchen zolpidem (AMBIEN) 10 MG tablet Take 10 mg by mouth at bedtime.      No current facility-administered medications for this visit.     Review of Systems  Constitutional:  Constitutional negative. HENT: HENT negative.  Respiratory: Respiratory negative.  Cardiovascular: Cardiovascular negative.  GI: Gastrointestinal negative.  GU: Genitourinary negative. Musculoskeletal: Musculoskeletal negative.  Neurological: Neurological negative.       Objective:  Objective   Vitals:   09/12/15 1107  BP: 126/77  Pulse: 84  Resp: 16  Temp: 97.9 F (36.6 C)  SpO2: 99%  Weight: 177 lb (80.3 kg)  Height: 5\' 11"  (1.803 m)   Body mass index is 24.69 kg/m.  Physical Exam  Constitutional: He is oriented to person, place, and time. He appears well-developed.  Neck: Normal range of motion.  Cardiovascular: Normal rate.   Pulmonary/Chest: Effort normal.  Abdominal: Soft.  Musculoskeletal: He exhibits no edema.  Neurological: He is alert and oriented to person, place, and time.  Skin: Skin is warm.     Assessment/Plan:  65 year old white male who is status post IVC filter placement for PE and upper GI bleed. His bleeding has resolved and he underwent EGD which demonstrated a small tear as the nidus. He is now maintained on Xarelto which she is tolerating for 4 weeks. His followed as an outpatient by Dr. Burr Medico. I will plan to remove the IVC filter in 2-4 weeks from now at his request. Should he have a bleeding episode between now and then we will reevaluate. I have asked him to hold his Xarelto for a period  of 48 hours prior to the procedure and will plan this is an outpatient.    Waynetta Sandy MD [facility]

## 2015-09-17 ENCOUNTER — Other Ambulatory Visit: Payer: Self-pay | Admitting: *Deleted

## 2015-09-24 ENCOUNTER — Ambulatory Visit (HOSPITAL_COMMUNITY)
Admission: RE | Admit: 2015-09-24 | Payer: BLUE CROSS/BLUE SHIELD | Source: Ambulatory Visit | Admitting: Vascular Surgery

## 2015-09-24 ENCOUNTER — Encounter (HOSPITAL_COMMUNITY): Admission: RE | Payer: Self-pay | Source: Ambulatory Visit

## 2015-09-24 SURGERY — IVC FILTER REMOVAL
Anesthesia: LOCAL

## 2015-09-25 ENCOUNTER — Ambulatory Visit: Admit: 2015-09-25 | Payer: BLUE CROSS/BLUE SHIELD | Admitting: Vascular Surgery

## 2015-09-25 SURGERY — INSERTION, FILTER, VENA CAVA
Anesthesia: Choice

## 2015-10-03 ENCOUNTER — Telehealth: Payer: Self-pay

## 2015-10-03 ENCOUNTER — Other Ambulatory Visit: Payer: Self-pay

## 2015-10-03 NOTE — Telephone Encounter (Signed)
Phone call to pt. To reschedule the procedure for IVC Filter Removal.  The pt. Stated he had a kidney stone, and was being treated for infection.  Was advised by MD treating the infection not to proceed with surgery until 2 weeks after he has completed the antibiotics.  The pt. Stated he will be finished with the antibiotic on 9/24.   Rescheduled the Removal of IVC Filter on 10/29/15 @ 8:30 AM.  Pt. Agrees with plan.  Reviewed pre-procedure instructions with him.  Advised to take last dose of Xarelto on 10/26/15, and to hold dose on 10/16 - 10/18 AM.  Advised he will be instructed when to resume Xarelto after the procedure.  Verb. Understanding.

## 2015-10-29 ENCOUNTER — Encounter (HOSPITAL_COMMUNITY): Payer: Self-pay | Admitting: Vascular Surgery

## 2015-10-29 ENCOUNTER — Ambulatory Visit (HOSPITAL_COMMUNITY)
Admission: RE | Admit: 2015-10-29 | Discharge: 2015-10-29 | Disposition: A | Discharge status: Home / Self Care | Payer: BLUE CROSS/BLUE SHIELD | Source: Ambulatory Visit | Attending: Vascular Surgery | Admitting: Vascular Surgery

## 2015-10-29 ENCOUNTER — Encounter (HOSPITAL_COMMUNITY)
Admission: RE | Disposition: A | Discharge status: Home / Self Care | Payer: Self-pay | Source: Ambulatory Visit | Attending: Vascular Surgery

## 2015-10-29 DIAGNOSIS — Z4689 Encounter for fitting and adjustment of other specified devices: Secondary | ICD-10-CM | POA: Diagnosis not present

## 2015-10-29 DIAGNOSIS — I2609 Other pulmonary embolism with acute cor pulmonale: Secondary | ICD-10-CM | POA: Diagnosis not present

## 2015-10-29 DIAGNOSIS — Z86718 Personal history of other venous thrombosis and embolism: Secondary | ICD-10-CM | POA: Insufficient documentation

## 2015-10-29 DIAGNOSIS — Z86711 Personal history of pulmonary embolism: Secondary | ICD-10-CM | POA: Insufficient documentation

## 2015-10-29 DIAGNOSIS — Z7901 Long term (current) use of anticoagulants: Secondary | ICD-10-CM | POA: Insufficient documentation

## 2015-10-29 HISTORY — PX: PERIPHERAL VASCULAR CATHETERIZATION: SHX172C

## 2015-10-29 LAB — POCT I-STAT, CHEM 8
BUN: 33 mg/dL — AB (ref 6–20)
CHLORIDE: 105 mmol/L (ref 101–111)
Calcium, Ion: 1.19 mmol/L (ref 1.15–1.40)
Creatinine, Ser: 1.3 mg/dL — ABNORMAL HIGH (ref 0.61–1.24)
Glucose, Bld: 93 mg/dL (ref 65–99)
HEMATOCRIT: 38 % — AB (ref 39.0–52.0)
Hemoglobin: 12.9 g/dL — ABNORMAL LOW (ref 13.0–17.0)
Potassium: 4 mmol/L (ref 3.5–5.1)
SODIUM: 144 mmol/L (ref 135–145)
TCO2: 30 mmol/L (ref 0–100)

## 2015-10-29 SURGERY — IVC FILTER REMOVAL
Anesthesia: LOCAL

## 2015-10-29 MED ORDER — OXYCODONE-ACETAMINOPHEN 5-325 MG PO TABS: 1.0000 | ORAL_TABLET | ORAL | Status: DC | PRN

## 2015-10-29 MED ORDER — HEPARIN (PORCINE) IN NACL 2-0.9 UNIT/ML-% IJ SOLN
INTRAMUSCULAR | Status: DC | PRN
  Administered 2015-10-29: 500 mL

## 2015-10-29 MED ORDER — IODIXANOL 320 MG/ML IV SOLN
INTRAVENOUS | Status: DC | PRN
  Administered 2015-10-29: 15 mL via INTRAVENOUS

## 2015-10-29 MED ORDER — LIDOCAINE HCL (PF) 1 % IJ SOLN
INTRAMUSCULAR | Status: DC | PRN
  Administered 2015-10-29: 5 mL

## 2015-10-29 MED ORDER — LIDOCAINE HCL (PF) 1 % IJ SOLN
INTRAMUSCULAR | Status: AC
  Filled 2015-10-29: qty 30

## 2015-10-29 MED ORDER — MORPHINE SULFATE (PF) 2 MG/ML IV SOLN: 2.0000 mg | INTRAVENOUS | Status: DC | PRN

## 2015-10-29 MED ORDER — SODIUM CHLORIDE 0.9 % IV SOLN
INTRAVENOUS | Status: DC
  Administered 2015-10-29: 07:00:00 via INTRAVENOUS

## 2015-10-29 MED ORDER — HEPARIN (PORCINE) IN NACL 2-0.9 UNIT/ML-% IJ SOLN
INTRAMUSCULAR | Status: AC
  Filled 2015-10-29: qty 500

## 2015-10-29 SURGICAL SUPPLY — 8 items
COVER PRB 48X5XTLSCP FOLD TPE (BAG) IMPLANT
COVER PROBE 5X48 (BAG) ×2
KIT MICROINTRODUCER STIFF 5F (SHEATH) ×1 IMPLANT
KIT PV (KITS) ×2 IMPLANT
SET VENACAVA FILTER RETRIEVAL (MISCELLANEOUS) ×1 IMPLANT
SHEATH PINNACLE 5F 10CM (SHEATH) ×1 IMPLANT
SYR MEDRAD MARK V 150ML (SYRINGE) ×2 IMPLANT
TRAY PV CATH (CUSTOM PROCEDURE TRAY) ×2 IMPLANT

## 2015-10-29 NOTE — H&P (Signed)
      History and Physical Update  The patient was interviewed and re-examined.  The patient's previous History and Physical has been reviewed and is unchanged from office. IVC filter removal today.  [facility] Office: [phone] Pager: [phone]   10/29/2015, 7:28 AM

## 2015-10-29 NOTE — Discharge Instructions (Signed)
IV retreval, Care After Refer to this sheet in the next few weeks. These instructions provide you with information on caring for yourself after your procedure. Your health care provider may also give you more specific instructions. Your treatment has been planned according to current medical practices, but problems sometimes occur. Call your health care provider if you have any problems or questions after your procedure. WHAT TO EXPECT AFTER THE PROCEDURE After your procedure, it is typical to have the following sensations:  Mild discomfort at the catheter insertion site. HOME CARE INSTRUCTIONS  If site bleeds// hold pressure for 15 minutes, hold up to 30 minutes. Seek help if  Bleeding does not stop.  Take all medicines exactly as directed.  Follow any prescribed diet.  Follow instructions regarding both rest and physical activity.  Drink more fluids for the first several days after the procedure in order to help flush dye from your kidneys. SEEK MEDICAL CARE IF:  You develop a rash.  You have fever not controlled by medicine. SEEK IMMEDIATE MEDICAL CARE IF:  There is pain, drainage, bleeding, redness, swelling, warmth or a red streak at the site of the IV tube.  The extremity where your IV tube was placed becomes discolored, numb, or cool.  You have difficulty breathing or shortness of breath.  You develop chest pain.  You have excessive dizziness or fainting.   This information is not intended to replace advice given to you by your health care provider. Make sure you discuss any questions you have with your health care provider.   Document Released: 10/18/2012 Document Revised: 01/02/2013 Document Reviewed: 10/18/2012 Elsevier Interactive Patient Education [employer].

## 2015-10-29 NOTE — Op Note (Signed)
    Patient name: Frank Dodson MRN: [id number] DOB: 1950/02/18 Sex: male  10/29/2015 Pre-operative Diagnosis: previous dvt with pe Post-operative diagnosis:  Same Surgeon:  Eda Paschal. Donzetta Matters, MD Procedure Performed: 1.  US guided cannulation right IJ 2.  Removal of ivc filter 3.  ivc venogram    Indications:  65 year old white male history of DVT with PE while and upper GI bleed and had IVC filter placed. He is now here for removal of his IVC filter and is tolerating xarelto at this time.  Findings: There was noted to be patent prior to removal. A completion of removal of IVC filter there was no active extravasation patent IVC.   Procedure:  The patient was identified in the holding area and taken to room 8.  Interspace supine on the operating table separate and draped in the usual fashion given Versed and fentanyl. Following this timeout was called his right neck was anesthetized with 1% lidocaine. Under ultrasound guidance identified the internal jugular vein was noted to be patent and compressible. Scanning with 18-gauge needle and wire was passed under fluoroscopic guidance to the level of the IVC. A 12 French dilator was used to dilate the tract and the retrieval sheath was then placed under fluoroscopic guidance. Venogram demonstrated patent IVC filter. A loop snare was used to snare the protocol without filter he was then captured with the sheath angiogram demonstrated patent IVC without active extravasation. Sheath was removed pressure held and patient tolerated procedure well transferred to holding discharged today.     Shirleymae Hauth C. Donzetta Matters, MD [facility] Office: [phone] Pager: [phone]

## 2015-12-24 ENCOUNTER — Other Ambulatory Visit (HOSPITAL_BASED_OUTPATIENT_CLINIC_OR_DEPARTMENT_OTHER): Payer: BLUE CROSS/BLUE SHIELD

## 2015-12-24 DIAGNOSIS — I2699 Other pulmonary embolism without acute cor pulmonale: Secondary | ICD-10-CM

## 2015-12-24 LAB — CBC WITH DIFFERENTIAL/PLATELET
BASO%: 0.5 % (ref 0.0–2.0)
Basophils Absolute: 0 10*3/uL (ref 0.0–0.1)
EOS%: 2.3 % (ref 0.0–7.0)
Eosinophils Absolute: 0.2 10*3/uL (ref 0.0–0.5)
HCT: 34.3 % — ABNORMAL LOW (ref 38.4–49.9)
HGB: 11.4 g/dL — ABNORMAL LOW (ref 13.0–17.1)
LYMPH%: 23.3 % (ref 14.0–49.0)
MCH: 32 pg (ref 27.2–33.4)
MCHC: 33.3 g/dL (ref 32.0–36.0)
MCV: 96.3 fL (ref 79.3–98.0)
MONO#: 0.9 10*3/uL (ref 0.1–0.9)
MONO%: 11.2 % (ref 0.0–14.0)
NEUT#: 5.2 10*3/uL (ref 1.5–6.5)
NEUT%: 62.7 % (ref 39.0–75.0)
PLATELETS: 236 10*3/uL (ref 140–400)
RBC: 3.57 10*6/uL — AB (ref 4.20–5.82)
RDW: 15.2 % — ABNORMAL HIGH (ref 11.0–14.6)
WBC: 8.3 10*3/uL (ref 4.0–10.3)
lymph#: 1.9 10*3/uL (ref 0.9–3.3)

## 2015-12-24 LAB — COMPREHENSIVE METABOLIC PANEL
ALK PHOS: 63 U/L (ref 40–150)
ALT: 19 U/L (ref 0–55)
ANION GAP: 9 meq/L (ref 3–11)
AST: 17 U/L (ref 5–34)
Albumin: 3.5 g/dL (ref 3.5–5.0)
BILIRUBIN TOTAL: 0.35 mg/dL (ref 0.20–1.20)
BUN: 17.9 mg/dL (ref 7.0–26.0)
CHLORIDE: 109 meq/L (ref 98–109)
CO2: 24 meq/L (ref 22–29)
Calcium: 9.1 mg/dL (ref 8.4–10.4)
Creatinine: 1.2 mg/dL (ref 0.7–1.3)
EGFR: 62 mL/min/{1.73_m2} — AB (ref >90)
Glucose: 99 mg/dl (ref 70–140)
POTASSIUM: 3.6 meq/L (ref 3.5–5.1)
Sodium: 142 mEq/L (ref 136–145)
Total Protein: 6.7 g/dL (ref 6.4–8.3)

## 2015-12-26 LAB — LUPUS ANTICOAGULANT PANEL
DRVVT MIX: 125.4 s — AB (ref 0.0–47.0)
Hexagonal Phase Phospholipid: 21 s — ABNORMAL HIGH (ref 0–11)
PTT-LA Mix: 50.1 s — ABNORMAL HIGH (ref 0.0–48.9)
PTT-LA: 57 s — ABNORMAL HIGH (ref 0.0–51.9)

## 2015-12-28 DIAGNOSIS — D6862 Lupus anticoagulant syndrome: Secondary | ICD-10-CM | POA: Insufficient documentation

## 2015-12-31 ENCOUNTER — Ambulatory Visit (HOSPITAL_BASED_OUTPATIENT_CLINIC_OR_DEPARTMENT_OTHER): Payer: BLUE CROSS/BLUE SHIELD | Admitting: Hematology

## 2015-12-31 VITALS — BP 139/83 | HR 85 | Temp 98.5°F | Resp 18 | Ht 71.0 in | Wt 182.9 lb

## 2015-12-31 DIAGNOSIS — Z86718 Personal history of other venous thrombosis and embolism: Secondary | ICD-10-CM

## 2015-12-31 DIAGNOSIS — D649 Anemia, unspecified: Secondary | ICD-10-CM | POA: Diagnosis not present

## 2015-12-31 DIAGNOSIS — D6862 Lupus anticoagulant syndrome: Secondary | ICD-10-CM

## 2015-12-31 DIAGNOSIS — I2699 Other pulmonary embolism without acute cor pulmonale: Secondary | ICD-10-CM | POA: Diagnosis not present

## 2015-12-31 DIAGNOSIS — Z7901 Long term (current) use of anticoagulants: Secondary | ICD-10-CM

## 2015-12-31 NOTE — Progress Notes (Signed)
[facility]  [phone] Fax:([phone]  Clinic Follow up Note   Patient Care Team: Leighton Ruff, MD as PCP - General Truitt Merle, MD as Consulting Physician (Hematology) Justice Britain, MD as Consulting Physician (Orthopedic Surgery) Paralee Cancel, MD as Consulting Physician (Orthopedic Surgery) Clarene Essex, MD as Consulting Physician (Gastroenterology) 12/31/2015  CHIEF COMPLAINTS  Follow up pulmonary embolism (PE)   HISTORY OF PRESENTING ILLNESS (09/03/2015):  Frank Dodson 65 y.o. male with PMH of Crohn's disease, hypertension, is here because of his recent episode of pulmonary embolism. He presents to my clinic by himself.  He developed left lobe extremity DVT in 2007 after his left knee injury, but he was not treated with anticoagulation, per patient. He had left knee replacement in 2012, did not have thrombosis after surgery.  He underwent left reverse shoulder arthroplasty for rotator cuff tear on 05/15/2015, and recovered very well and back to his usual physical activities quickly. He was at his usual health's status and activity level, until 08/11/2015 when he developed sudden onset chest pain and dyspnea Patient was subsequently found to have large acute bilateral pulmonary emboli. He was subsequently started on heparin drip but developed coffee-ground hematemesis and melanotic stools. He underwent IVC filter placement 08/13/2015. He has been seen by GI in consultation and EGD was negative for active bleeding or significant lesions. He was discharged home with Xarelto. He is tolerating well.  He denies recent  history of trauma, long distance travel, dehydration, recent surgery, smoking or prolonged immobilization. He had no prior history or diagnosis of cancer. His age appropriate screening programs are up-to-date. There is no family history of blood clots.   CURRENT THERAPY: Xarelto 71m dialy  INTERIM HISTORY: Mr. HMalterreturns for follow-up. He  is doing very well, denies any pain, dyspnea, leg swelling, or other symptoms. He is compliant with Xarelto, tolerating well, no bleeding or other side effects.  MEDICAL HISTORY:  Past Medical History:  Diagnosis Date  . Crohn's colitis ([place]    history of due to surgery  . Decreased range of motion (ROM) of shoulder    "both shoulders; never had a rotator cuff tear; at some point in my life I've sheared both muscles off"  . Eczema   . Fecal urgency   . Frequent bowel movements    06-28-13  extreme urgency with bowel movements "3-5" per day or more  . GERD (gastroesophageal reflux disease)   . History of gout   . Hypertension   . Iron deficiency anemia   . Osteoarthritis   . Pneumonia ~ 2007  . PONV (postoperative nausea and vomiting)    "with ether"  . Rheumatoid arthritis ([facility]   . Rotator cuff tear    left - limited motion of arm    SURGICAL HISTORY: Past Surgical History:  Procedure Laterality Date  . COLECTOMY    . COLONOSCOPY    . CYSTOSCOPY  ~ 2013   "clean out of uric acid deposits"  . ENTEROSTOMY CLOSURE  11/2014  . ESOPHAGOGASTRODUODENOSCOPY    . ESOPHAGOGASTRODUODENOSCOPY N/A 08/14/2015   Procedure: ESOPHAGOGASTRODUODENOSCOPY (EGD);  Surgeon: MClarene Essex MD;  [facility];  Service: Endoscopy;  Laterality: N/A;  . JOINT REPLACEMENT    . LAPAROSCOPIC PARTIAL PROTECTOMY  10/2014   with J pouch  . PERIPHERAL VASCULAR CATHETERIZATION N/A 08/13/2015   Procedure: IVC Filter Insertion;  Surgeon: BWaynetta Sandy MD;  [facility];  Service: Cardiovascular;  Laterality: N/A;  . PERIPHERAL  VASCULAR CATHETERIZATION N/A 10/29/2015   Procedure: IVC Filter Removal;  Surgeon: Waynetta Sandy, MD;  Location: [facility];  Service: Cardiovascular;  Laterality: N/A;  . PERIPHERAL VASCULAR CATHETERIZATION N/A 10/29/2015   Procedure: IVC/SVC Venography;  Surgeon: Waynetta Sandy, MD;  Location: [facility];  Service:  Cardiovascular;  Laterality: N/A;  . REVERSE SHOULDER ARTHROPLASTY Left 05/15/2015  . REVERSE SHOULDER ARTHROPLASTY Left 05/15/2015   Procedure: LEFT REVERSE SHOULDER ARTHROPLASTY;  Surgeon: Justice Britain, MD;  Location: [facility];  Service: Orthopedics;  Laterality: Left;  . 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 TOTAL KNEE ARTHROPLASTY Right 09/04/2013   Procedure: RIGHT TOTAL KNEE ARTHROPLASTY;  Surgeon: Mauri Pole, MD;  Location: [facility];  Service: Orthopedics;  Laterality: Right;  . TOTAL KNEE ARTHROPLASTY Left 2011    SOCIAL HISTORY: Social History   Social History  . Marital status: Married    Spouse name: N/A  . Number of children: N/A  . Years of education: N/A   Occupational History  . Not on file.   Social History Main Topics  . Smoking status: Never Smoker  . Smokeless tobacco: Never Used  . Alcohol use Yes     Comment: 05/15/2015 "might have 6 beers/month"  . Drug use: No  . Sexual activity: Yes   Other Topics Concern  . Not on file   Social History Narrative  . No narrative on file    FAMILY HISTORY: Family History  Problem Relation Age of Onset  . Subarachnoid hemorrhage Father   . Diabetes Mellitus II Father     ALLERGIES:  has No Known Allergies.  MEDICATIONS:  Current Outpatient Prescriptions  Medication Sig Dispense Refill  . acetaminophen (TYLENOL) 650 MG CR tablet Take 1,300 mg by mouth every 8 (eight) hours as needed for pain.    [name] Kitchen allopurinol (ZYLOPRIM) 100 MG tablet Take 100 mg by mouth 2 (two) times daily.     . Ascorbic Acid (VITAMIN C) 1000 MG tablet Take 1,000 mg by mouth daily.    [name] Kitchen doxazosin (CARDURA) 4 MG tablet Take 4 mg by mouth every evening.    . Emollient (GOLD BOND MEDICATED BODY EX) Apply 1 application topically 2 (two) times daily as needed (rash).    . ferrous sulfate 325 (65 FE) MG tablet Take 325 mg by mouth 2 (two) times daily with a meal.    . halobetasol (ULTRAVATE) 0.05 %  ointment     . hydrOXYzine (VISTARIL) 25 MG capsule Take 2 capsules by mouth at bedtime.  2  . loperamide (IMODIUM) 2 MG capsule Take 2 mg by mouth 5 (five) times daily.     . magnesium gluconate (MAGONATE) 500 MG tablet Take 500 mg by mouth daily.    . Multiple Vitamins-Minerals (CENTRUM SILVER PO) Take 1 tablet by mouth daily.    . pantoprazole (PROTONIX) 40 MG tablet Take 1 tablet (40 mg total) by mouth 2 (two) times daily before a meal. (Patient taking differently: Take 80 mg by mouth 2 (two) times daily before a meal. ) 60 tablet 0  . predniSONE (DELTASONE) 5 MG tablet Take 5 mg by mouth daily.     . quinapril (ACCUPRIL) 40 MG tablet Take 40 mg by mouth daily.     [name] Kitchen saccharomyces boulardii (FLORASTOR) 250 MG capsule Take 250 mg by mouth daily.    . traMADol (ULTRAM) 50 MG tablet Take 100 mg by mouth  2 (two) times daily.    Alveda Reasons 20 MG TABS tablet Take 20 mg by mouth daily.  0  . zolpidem (AMBIEN) 10 MG tablet Take 10 mg by mouth at bedtime.      No current facility-administered medications for this visit.     REVIEW OF SYSTEMS:   Constitutional: Denies fevers, chills or abnormal night sweats Eyes: Denies blurriness of vision, double vision or watery eyes Ears, nose, mouth, throat, and face: Denies mucositis or sore throat Respiratory: Denies cough, dyspnea or wheezes Cardiovascular: Denies palpitation, chest discomfort or lower extremity swelling Gastrointestinal:  Denies nausea, heartburn or change in bowel habits Skin: Denies abnormal skin rashes Lymphatics: Denies new lymphadenopathy or easy bruising Neurological:Denies numbness, tingling or new weaknesses Behavioral/Psych: Mood is stable, no new changes  All other systems were reviewed with the patient and are negative.  PHYSICAL EXAMINATION: ECOG PERFORMANCE STATUS: 0 - Asymptomatic  Vitals:   12/31/15 1123  BP: 139/83  Pulse: 85  Resp: 18  Temp: 98.5 F (36.9 C)   Filed Weights   12/31/15 1123  Weight: 182 lb  14.4 oz (83 kg)    GENERAL:alert, no distress and comfortable SKIN: skin color, texture, turgor are normal, no rashes or significant lesions EYES: normal, conjunctiva are pink and non-injected, sclera clear OROPHARYNX:no exudate, no erythema and lips, buccal mucosa, and tongue normal  NECK: supple, thyroid normal size, non-tender, without nodularity LYMPH:  no palpable lymphadenopathy in the cervical, axillary or inguinal LUNGS: clear to auscultation and percussion with normal breathing effort HEART: regular rate & rhythm and no murmurs and no lower extremity edema ABDOMEN:abdomen soft, non-tender and normal bowel sounds Musculoskeletal:no cyanosis of digits and no clubbing  PSYCH: alert & oriented x 3 with fluent speech NEURO: no focal motor/sensory deficits  LABORATORY DATA:  I have reviewed the data as listed Recent Results (from the past 2160 hour(s))  I-STAT, chem 8     Status: Abnormal   Collection Time: 10/29/15  6:48 AM  Result Value Ref Range   Sodium 144 135 - 145 mmol/L   Potassium 4.0 3.5 - 5.1 mmol/L   Chloride 105 101 - 111 mmol/L   BUN 33 (H) 6 - 20 mg/dL   Creatinine, Ser 1.30 (H) 0.61 - 1.24 mg/dL   Glucose, Bld 93 65 - 99 mg/dL   Calcium, Ion 1.19 1.15 - 1.40 mmol/L   TCO2 30 0 - 100 mmol/L   Hemoglobin 12.9 (L) 13.0 - 17.0 g/dL   HCT 38.0 (L) 39.0 - 52.0 %  CBC with Differential     Status: Abnormal   Collection Time: 12/24/15 11:36 AM  Result Value Ref Range   WBC 8.3 4.0 - 10.3 10e3/uL   NEUT# 5.2 1.5 - 6.5 10e3/uL   HGB 11.4 (L) 13.0 - 17.1 g/dL   HCT 34.3 (L) 38.4 - 49.9 %   Platelets 236 140 - 400 10e3/uL   MCV 96.3 79.3 - 98.0 fL   MCH 32.0 27.2 - 33.4 pg   MCHC 33.3 32.0 - 36.0 g/dL   RBC 3.57 (L) 4.20 - 5.82 10e6/uL   RDW 15.2 (H) 11.0 - 14.6 %   lymph# 1.9 0.9 - 3.3 10e3/uL   MONO# 0.9 0.1 - 0.9 10e3/uL   Eosinophils Absolute 0.2 0.0 - 0.5 10e3/uL   Basophils Absolute 0.0 0.0 - 0.1 10e3/uL   NEUT% 62.7 39.0 - 75.0 %   LYMPH% 23.3 14.0 -  49.0 %   MONO% 11.2 0.0 - 14.0 %  EOS% 2.3 0.0 - 7.0 %   BASO% 0.5 0.0 - 2.0 %  Comprehensive metabolic panel     Status: Abnormal   Collection Time: 12/24/15 11:37 AM  Result Value Ref Range   Sodium 142 136 - 145 mEq/L   Potassium 3.6 3.5 - 5.1 mEq/L   Chloride 109 98 - 109 mEq/L   CO2 24 22 - 29 mEq/L   Glucose 99 70 - 140 mg/dl    Comment: Glucose reference range is for nonfasting patients. Fasting glucose reference range is 70- 100.   BUN 17.9 7.0 - 26.0 mg/dL   Creatinine 1.2 0.7 - 1.3 mg/dL   Total Bilirubin 0.35 0.20 - 1.20 mg/dL   Alkaline Phosphatase 63 40 - 150 U/L   AST 17 5 - 34 U/L   ALT 19 0 - 55 U/L   Total Protein 6.7 6.4 - 8.3 g/dL   Albumin 3.5 3.5 - 5.0 g/dL   Calcium 9.1 8.4 - 10.4 mg/dL   Anion Gap 9 3 - 11 mEq/L   EGFR 62 (L) >90 ml/min/1.73 m2    Comment: eGFR is calculated using the CKD-EPI Creatinine Equation (2009)  Lupus anticoagulant panel*     Status: Abnormal   Collection Time: 12/24/15 11:37 AM  Result Value Ref Range   PTT-LA 57.0 (H) 0.0 - 51.9 sec   PTT-LA Mix 50.1 (H) 0.0 - 48.9 sec   Hexagonal Phase Phospholipid 21 (H) 0 - 11 sec   dRVVT >180.0 (H) 0.0 - 47.0 sec   dRVVT Mix 125.4 (H) 0.0 - 47.0 sec   dRVVT Confirm >2.1 (H) 0.8 - 1.2 ratio   Lupus Reflex Interpretation Comment:     Comment: Results are consistent with the presence of a lupus anticoagulant. NOTE: Only persistent lupus anticoagulants are thought to be of clinical significance. For this reason, repeat testing in 12 or more weeks after an initial positive result should be considered to confirm or refute the presence of a lupus anticoagulant, depending on clinical presentation. Results of lupus anticoagulant tests may be falsely positive in the presence of certain anticoagulant therapies.     RADIOGRAPHIC STUDIES: I have personally reviewed the radiological images as listed and agreed with the findings in the report. No results found.  ASSESSMENT: 65 y.o.  male, with  past medical history of Crohn's disease, hypertension, left lower extremity DVT after a knee injury in 2007, now presented with unprovoked PE  1. Recurrent DVT/PE -I discussed his lab work from last week, which showed persistent positive lupus anticoagulant. His CBC and CMP are unremarkable except mild anemia. -Giving the severity of his thrombosis, and positive lupus anticoagulant, I recommend long term anticoagulation  -Continue Xarelto indefinitely. He is tolerating very well. -If he has recurrent GI bleeding, I recommend him to switch to Coumadin, which is more reversible. He is agreeable -He will follow-up with his primary care physician Gerrit Heck, MD -I will see him as needed in the future   2. Mild anemia  -history of GI bleeding when on anticoagulation -I encourage him to continue iron supplement   Follow up -He will continue Xarelto indefinitely, and follow-up with his primary care physician. I'll see him as needed in the future.  All questions were answered. The patient knows to call the clinic with any problems, questions or concerns.  I spent 15  minutes counseling the patient face to face. The total time spent in the appointment was 20 minutes and more than 50% was on counseling.  Truitt Merle, MD 12/31/2015 11:42 AM

## 2016-01-05 ENCOUNTER — Encounter: Payer: Self-pay | Admitting: Hematology

## 2017-07-21 DIAGNOSIS — M0589 Other rheumatoid arthritis with rheumatoid factor of multiple sites: Secondary | ICD-10-CM | POA: Diagnosis not present

## 2017-07-21 DIAGNOSIS — Z79899 Other long term (current) drug therapy: Secondary | ICD-10-CM | POA: Diagnosis not present

## 2017-07-21 DIAGNOSIS — K50818 Crohn's disease of both small and large intestine with other complication: Secondary | ICD-10-CM | POA: Diagnosis not present

## 2017-07-22 DIAGNOSIS — M4690 Unspecified inflammatory spondylopathy, site unspecified: Secondary | ICD-10-CM | POA: Diagnosis not present

## 2017-07-22 DIAGNOSIS — K50818 Crohn's disease of both small and large intestine with other complication: Secondary | ICD-10-CM | POA: Diagnosis not present

## 2017-07-22 DIAGNOSIS — M0589 Other rheumatoid arthritis with rheumatoid factor of multiple sites: Secondary | ICD-10-CM | POA: Diagnosis not present

## 2017-07-22 DIAGNOSIS — M25532 Pain in left wrist: Secondary | ICD-10-CM | POA: Diagnosis not present

## 2017-07-22 DIAGNOSIS — M1009 Idiopathic gout, multiple sites: Secondary | ICD-10-CM | POA: Diagnosis not present

## 2017-07-22 DIAGNOSIS — D500 Iron deficiency anemia secondary to blood loss (chronic): Secondary | ICD-10-CM | POA: Diagnosis not present

## 2017-07-22 DIAGNOSIS — M25531 Pain in right wrist: Secondary | ICD-10-CM | POA: Diagnosis not present

## 2017-07-22 DIAGNOSIS — Z79899 Other long term (current) drug therapy: Secondary | ICD-10-CM | POA: Diagnosis not present

## 2017-07-22 DIAGNOSIS — Z6823 Body mass index (BMI) 23.0-23.9, adult: Secondary | ICD-10-CM | POA: Diagnosis not present

## 2017-08-29 DIAGNOSIS — D500 Iron deficiency anemia secondary to blood loss (chronic): Secondary | ICD-10-CM | POA: Diagnosis not present

## 2017-08-29 DIAGNOSIS — D649 Anemia, unspecified: Secondary | ICD-10-CM | POA: Diagnosis not present

## 2017-09-09 DIAGNOSIS — L309 Dermatitis, unspecified: Secondary | ICD-10-CM | POA: Diagnosis not present

## 2017-09-19 DIAGNOSIS — K50818 Crohn's disease of both small and large intestine with other complication: Secondary | ICD-10-CM | POA: Diagnosis not present

## 2017-10-06 DIAGNOSIS — I10 Essential (primary) hypertension: Secondary | ICD-10-CM | POA: Diagnosis not present

## 2017-10-06 DIAGNOSIS — Z23 Encounter for immunization: Secondary | ICD-10-CM | POA: Diagnosis not present

## 2017-10-06 DIAGNOSIS — D509 Iron deficiency anemia, unspecified: Secondary | ICD-10-CM | POA: Diagnosis not present

## 2017-10-06 DIAGNOSIS — G479 Sleep disorder, unspecified: Secondary | ICD-10-CM | POA: Diagnosis not present

## 2017-10-06 DIAGNOSIS — E781 Pure hyperglyceridemia: Secondary | ICD-10-CM | POA: Diagnosis not present

## 2017-11-03 DIAGNOSIS — D500 Iron deficiency anemia secondary to blood loss (chronic): Secondary | ICD-10-CM | POA: Diagnosis not present

## 2017-11-03 DIAGNOSIS — Z6822 Body mass index (BMI) 22.0-22.9, adult: Secondary | ICD-10-CM | POA: Diagnosis not present

## 2017-11-03 DIAGNOSIS — M0589 Other rheumatoid arthritis with rheumatoid factor of multiple sites: Secondary | ICD-10-CM | POA: Diagnosis not present

## 2017-11-03 DIAGNOSIS — M1009 Idiopathic gout, multiple sites: Secondary | ICD-10-CM | POA: Diagnosis not present

## 2017-11-03 DIAGNOSIS — M4690 Unspecified inflammatory spondylopathy, site unspecified: Secondary | ICD-10-CM | POA: Diagnosis not present

## 2017-11-03 DIAGNOSIS — Z79899 Other long term (current) drug therapy: Secondary | ICD-10-CM | POA: Diagnosis not present

## 2017-11-03 DIAGNOSIS — K50818 Crohn's disease of both small and large intestine with other complication: Secondary | ICD-10-CM | POA: Diagnosis not present

## 2017-11-15 DIAGNOSIS — K50818 Crohn's disease of both small and large intestine with other complication: Secondary | ICD-10-CM | POA: Diagnosis not present

## 2017-11-29 ENCOUNTER — Other Ambulatory Visit: Payer: Self-pay | Admitting: Gastroenterology

## 2017-11-29 DIAGNOSIS — K5090 Crohn's disease, unspecified, without complications: Secondary | ICD-10-CM | POA: Diagnosis not present

## 2017-11-29 DIAGNOSIS — D509 Iron deficiency anemia, unspecified: Secondary | ICD-10-CM | POA: Diagnosis not present

## 2017-11-29 DIAGNOSIS — R634 Abnormal weight loss: Secondary | ICD-10-CM

## 2017-11-29 DIAGNOSIS — K50919 Crohn's disease, unspecified, with unspecified complications: Secondary | ICD-10-CM

## 2017-12-01 DIAGNOSIS — Z9181 History of falling: Secondary | ICD-10-CM | POA: Diagnosis not present

## 2017-12-01 DIAGNOSIS — Z86711 Personal history of pulmonary embolism: Secondary | ICD-10-CM | POA: Diagnosis not present

## 2017-12-01 DIAGNOSIS — K5090 Crohn's disease, unspecified, without complications: Secondary | ICD-10-CM | POA: Diagnosis not present

## 2017-12-01 DIAGNOSIS — I10 Essential (primary) hypertension: Secondary | ICD-10-CM | POA: Diagnosis not present

## 2017-12-01 DIAGNOSIS — Z1331 Encounter for screening for depression: Secondary | ICD-10-CM | POA: Diagnosis not present

## 2017-12-01 DIAGNOSIS — G4700 Insomnia, unspecified: Secondary | ICD-10-CM | POA: Diagnosis not present

## 2017-12-01 DIAGNOSIS — Z1339 Encounter for screening examination for other mental health and behavioral disorders: Secondary | ICD-10-CM | POA: Diagnosis not present

## 2017-12-05 ENCOUNTER — Other Ambulatory Visit: Payer: BLUE CROSS/BLUE SHIELD

## 2017-12-06 ENCOUNTER — Ambulatory Visit
Admission: RE | Admit: 2017-12-06 | Discharge: 2017-12-06 | Disposition: A | Discharge status: Home / Self Care | Payer: Medicare Other | Source: Ambulatory Visit | Attending: Gastroenterology | Admitting: Gastroenterology

## 2017-12-06 DIAGNOSIS — R634 Abnormal weight loss: Secondary | ICD-10-CM

## 2017-12-06 DIAGNOSIS — D509 Iron deficiency anemia, unspecified: Secondary | ICD-10-CM

## 2017-12-06 DIAGNOSIS — K50919 Crohn's disease, unspecified, with unspecified complications: Secondary | ICD-10-CM

## 2017-12-06 MED ORDER — IOHEXOL 300 MG/ML  SOLN
100.0000 mL | Freq: Once | INTRAMUSCULAR | Status: AC | PRN
  Administered 2017-12-06: 100 mL via INTRAVENOUS

## 2017-12-16 DIAGNOSIS — K317 Polyp of stomach and duodenum: Secondary | ICD-10-CM | POA: Diagnosis not present

## 2017-12-16 DIAGNOSIS — K2980 Duodenitis without bleeding: Secondary | ICD-10-CM | POA: Diagnosis not present

## 2017-12-16 DIAGNOSIS — D509 Iron deficiency anemia, unspecified: Secondary | ICD-10-CM | POA: Diagnosis not present

## 2017-12-20 DIAGNOSIS — K2980 Duodenitis without bleeding: Secondary | ICD-10-CM | POA: Diagnosis not present

## 2018-01-10 DIAGNOSIS — Z79899 Other long term (current) drug therapy: Secondary | ICD-10-CM | POA: Diagnosis not present

## 2018-01-10 DIAGNOSIS — K50818 Crohn's disease of both small and large intestine with other complication: Secondary | ICD-10-CM | POA: Diagnosis not present

## 2018-01-30 ENCOUNTER — Other Ambulatory Visit: Payer: Self-pay | Admitting: Gastroenterology

## 2018-02-01 DIAGNOSIS — Z1159 Encounter for screening for other viral diseases: Secondary | ICD-10-CM | POA: Diagnosis not present

## 2018-02-01 DIAGNOSIS — Z125 Encounter for screening for malignant neoplasm of prostate: Secondary | ICD-10-CM | POA: Diagnosis not present

## 2018-02-01 DIAGNOSIS — Z136 Encounter for screening for cardiovascular disorders: Secondary | ICD-10-CM | POA: Diagnosis not present

## 2018-02-01 DIAGNOSIS — Z23 Encounter for immunization: Secondary | ICD-10-CM | POA: Diagnosis not present

## 2018-02-01 DIAGNOSIS — Z0000 Encounter for general adult medical examination without abnormal findings: Secondary | ICD-10-CM | POA: Diagnosis not present

## 2018-02-03 DIAGNOSIS — M2550 Pain in unspecified joint: Secondary | ICD-10-CM | POA: Diagnosis not present

## 2018-02-03 DIAGNOSIS — K50818 Crohn's disease of both small and large intestine with other complication: Secondary | ICD-10-CM | POA: Diagnosis not present

## 2018-02-03 DIAGNOSIS — E663 Overweight: Secondary | ICD-10-CM | POA: Diagnosis not present

## 2018-02-03 DIAGNOSIS — M4690 Unspecified inflammatory spondylopathy, site unspecified: Secondary | ICD-10-CM | POA: Diagnosis not present

## 2018-02-03 DIAGNOSIS — M0589 Other rheumatoid arthritis with rheumatoid factor of multiple sites: Secondary | ICD-10-CM | POA: Diagnosis not present

## 2018-02-03 DIAGNOSIS — Z79899 Other long term (current) drug therapy: Secondary | ICD-10-CM | POA: Diagnosis not present

## 2018-02-03 DIAGNOSIS — M1009 Idiopathic gout, multiple sites: Secondary | ICD-10-CM | POA: Diagnosis not present

## 2018-02-03 DIAGNOSIS — Z6825 Body mass index (BMI) 25.0-25.9, adult: Secondary | ICD-10-CM | POA: Diagnosis not present

## 2018-02-14 ENCOUNTER — Ambulatory Visit (HOSPITAL_COMMUNITY)
Admission: RE | Admit: 2018-02-14 | Discharge: 2018-02-14 | Disposition: A | Discharge status: Home / Self Care | Payer: Medicare Other | Attending: Gastroenterology | Admitting: Gastroenterology

## 2018-02-14 ENCOUNTER — Encounter (HOSPITAL_COMMUNITY)
Admission: RE | Disposition: A | Discharge status: Home / Self Care | Payer: Self-pay | Source: Home / Self Care | Attending: Gastroenterology

## 2018-02-14 DIAGNOSIS — D649 Anemia, unspecified: Secondary | ICD-10-CM | POA: Diagnosis not present

## 2018-02-14 DIAGNOSIS — K5090 Crohn's disease, unspecified, without complications: Secondary | ICD-10-CM | POA: Insufficient documentation

## 2018-02-14 DIAGNOSIS — K529 Noninfective gastroenteritis and colitis, unspecified: Secondary | ICD-10-CM | POA: Diagnosis not present

## 2018-02-14 HISTORY — PX: GIVENS CAPSULE STUDY: SHX5432

## 2018-02-14 SURGERY — IMAGING PROCEDURE, GI TRACT, INTRALUMINAL, VIA CAPSULE
Anesthesia: LOCAL

## 2018-02-14 MED ORDER — SODIUM CHLORIDE 0.9 % IV SOLN: INTRAVENOUS | Status: AC

## 2018-02-14 SURGICAL SUPPLY — 1 items: TOWEL COTTON PACK 4EA (MISCELLANEOUS) ×4 IMPLANT

## 2018-02-15 DIAGNOSIS — K5090 Crohn's disease, unspecified, without complications: Secondary | ICD-10-CM | POA: Diagnosis not present

## 2018-02-15 DIAGNOSIS — D509 Iron deficiency anemia, unspecified: Secondary | ICD-10-CM | POA: Diagnosis not present

## 2018-02-16 ENCOUNTER — Encounter (HOSPITAL_COMMUNITY): Payer: Self-pay | Admitting: Gastroenterology

## 2018-02-16 NOTE — Progress Notes (Signed)
Patient here for capsule endoscopy study.  Patient swallowed capsule with no problems.  Instructions were given on when he may eat and what he may eat.  Also instructed when to removed sensors and when and where to bring equipment back.  Patient verbalized understanding.

## 2018-02-24 DIAGNOSIS — L308 Other specified dermatitis: Secondary | ICD-10-CM | POA: Diagnosis not present

## 2018-03-07 DIAGNOSIS — K50818 Crohn's disease of both small and large intestine with other complication: Secondary | ICD-10-CM | POA: Diagnosis not present

## 2018-04-03 DIAGNOSIS — G4700 Insomnia, unspecified: Secondary | ICD-10-CM | POA: Diagnosis not present

## 2018-04-03 DIAGNOSIS — I10 Essential (primary) hypertension: Secondary | ICD-10-CM | POA: Diagnosis not present

## 2018-04-03 DIAGNOSIS — K5090 Crohn's disease, unspecified, without complications: Secondary | ICD-10-CM | POA: Diagnosis not present

## 2018-04-03 DIAGNOSIS — Z86711 Personal history of pulmonary embolism: Secondary | ICD-10-CM | POA: Diagnosis not present

## 2018-04-27 DIAGNOSIS — K50818 Crohn's disease of both small and large intestine with other complication: Secondary | ICD-10-CM | POA: Diagnosis not present

## 2018-04-27 DIAGNOSIS — M4690 Unspecified inflammatory spondylopathy, site unspecified: Secondary | ICD-10-CM | POA: Diagnosis not present

## 2018-06-06 DIAGNOSIS — E663 Overweight: Secondary | ICD-10-CM | POA: Diagnosis not present

## 2018-06-06 DIAGNOSIS — K50818 Crohn's disease of both small and large intestine with other complication: Secondary | ICD-10-CM | POA: Diagnosis not present

## 2018-06-06 DIAGNOSIS — Z6825 Body mass index (BMI) 25.0-25.9, adult: Secondary | ICD-10-CM | POA: Diagnosis not present

## 2018-06-06 DIAGNOSIS — M4690 Unspecified inflammatory spondylopathy, site unspecified: Secondary | ICD-10-CM | POA: Diagnosis not present

## 2018-06-06 DIAGNOSIS — Z79899 Other long term (current) drug therapy: Secondary | ICD-10-CM | POA: Diagnosis not present

## 2018-06-06 DIAGNOSIS — M1009 Idiopathic gout, multiple sites: Secondary | ICD-10-CM | POA: Diagnosis not present

## 2018-06-06 DIAGNOSIS — M0589 Other rheumatoid arthritis with rheumatoid factor of multiple sites: Secondary | ICD-10-CM | POA: Diagnosis not present

## 2018-06-06 DIAGNOSIS — M2550 Pain in unspecified joint: Secondary | ICD-10-CM | POA: Diagnosis not present

## 2018-06-23 DIAGNOSIS — K50818 Crohn's disease of both small and large intestine with other complication: Secondary | ICD-10-CM | POA: Diagnosis not present

## 2018-06-23 DIAGNOSIS — M4690 Unspecified inflammatory spondylopathy, site unspecified: Secondary | ICD-10-CM | POA: Diagnosis not present

## 2018-07-26 DIAGNOSIS — R7989 Other specified abnormal findings of blood chemistry: Secondary | ICD-10-CM | POA: Diagnosis not present

## 2018-07-26 DIAGNOSIS — D649 Anemia, unspecified: Secondary | ICD-10-CM | POA: Diagnosis not present

## 2018-08-18 DIAGNOSIS — D649 Anemia, unspecified: Secondary | ICD-10-CM | POA: Diagnosis not present

## 2018-08-18 DIAGNOSIS — R7989 Other specified abnormal findings of blood chemistry: Secondary | ICD-10-CM | POA: Diagnosis not present

## 2018-08-18 DIAGNOSIS — K50818 Crohn's disease of both small and large intestine with other complication: Secondary | ICD-10-CM | POA: Diagnosis not present

## 2018-08-28 DIAGNOSIS — R7989 Other specified abnormal findings of blood chemistry: Secondary | ICD-10-CM | POA: Diagnosis not present

## 2018-08-28 DIAGNOSIS — D649 Anemia, unspecified: Secondary | ICD-10-CM | POA: Diagnosis not present

## 2018-09-06 DIAGNOSIS — M4690 Unspecified inflammatory spondylopathy, site unspecified: Secondary | ICD-10-CM | POA: Diagnosis not present

## 2018-09-06 DIAGNOSIS — M1009 Idiopathic gout, multiple sites: Secondary | ICD-10-CM | POA: Diagnosis not present

## 2018-09-06 DIAGNOSIS — M0589 Other rheumatoid arthritis with rheumatoid factor of multiple sites: Secondary | ICD-10-CM | POA: Diagnosis not present

## 2018-09-06 DIAGNOSIS — Z6826 Body mass index (BMI) 26.0-26.9, adult: Secondary | ICD-10-CM | POA: Diagnosis not present

## 2018-09-06 DIAGNOSIS — Z79899 Other long term (current) drug therapy: Secondary | ICD-10-CM | POA: Diagnosis not present

## 2018-09-06 DIAGNOSIS — E663 Overweight: Secondary | ICD-10-CM | POA: Diagnosis not present

## 2018-09-06 DIAGNOSIS — M2550 Pain in unspecified joint: Secondary | ICD-10-CM | POA: Diagnosis not present

## 2018-09-06 DIAGNOSIS — K50818 Crohn's disease of both small and large intestine with other complication: Secondary | ICD-10-CM | POA: Diagnosis not present

## 2018-10-04 DIAGNOSIS — Z139 Encounter for screening, unspecified: Secondary | ICD-10-CM | POA: Diagnosis not present

## 2018-10-04 DIAGNOSIS — K5090 Crohn's disease, unspecified, without complications: Secondary | ICD-10-CM | POA: Diagnosis not present

## 2018-10-04 DIAGNOSIS — G4700 Insomnia, unspecified: Secondary | ICD-10-CM | POA: Diagnosis not present

## 2018-10-04 DIAGNOSIS — Z86711 Personal history of pulmonary embolism: Secondary | ICD-10-CM | POA: Diagnosis not present

## 2018-10-04 DIAGNOSIS — Z79899 Other long term (current) drug therapy: Secondary | ICD-10-CM | POA: Diagnosis not present

## 2018-10-04 DIAGNOSIS — I10 Essential (primary) hypertension: Secondary | ICD-10-CM | POA: Diagnosis not present

## 2018-10-04 DIAGNOSIS — Z23 Encounter for immunization: Secondary | ICD-10-CM | POA: Diagnosis not present

## 2018-10-13 DIAGNOSIS — Z79899 Other long term (current) drug therapy: Secondary | ICD-10-CM | POA: Diagnosis not present

## 2018-10-13 DIAGNOSIS — K50818 Crohn's disease of both small and large intestine with other complication: Secondary | ICD-10-CM | POA: Diagnosis not present

## 2018-12-06 DIAGNOSIS — K50818 Crohn's disease of both small and large intestine with other complication: Secondary | ICD-10-CM | POA: Diagnosis not present

## 2018-12-06 DIAGNOSIS — M0589 Other rheumatoid arthritis with rheumatoid factor of multiple sites: Secondary | ICD-10-CM | POA: Diagnosis not present

## 2018-12-06 DIAGNOSIS — M1009 Idiopathic gout, multiple sites: Secondary | ICD-10-CM | POA: Diagnosis not present

## 2018-12-06 DIAGNOSIS — Z79899 Other long term (current) drug therapy: Secondary | ICD-10-CM | POA: Diagnosis not present

## 2018-12-06 DIAGNOSIS — M25532 Pain in left wrist: Secondary | ICD-10-CM | POA: Diagnosis not present

## 2018-12-06 DIAGNOSIS — M4690 Unspecified inflammatory spondylopathy, site unspecified: Secondary | ICD-10-CM | POA: Diagnosis not present

## 2018-12-06 DIAGNOSIS — M25531 Pain in right wrist: Secondary | ICD-10-CM | POA: Diagnosis not present

## 2018-12-11 DIAGNOSIS — K50818 Crohn's disease of both small and large intestine with other complication: Secondary | ICD-10-CM | POA: Diagnosis not present

## 2019-02-09 DIAGNOSIS — K50818 Crohn's disease of both small and large intestine with other complication: Secondary | ICD-10-CM | POA: Diagnosis not present

## 2019-04-04 DIAGNOSIS — I10 Essential (primary) hypertension: Secondary | ICD-10-CM | POA: Diagnosis not present

## 2019-04-04 DIAGNOSIS — G2581 Restless legs syndrome: Secondary | ICD-10-CM | POA: Diagnosis not present

## 2019-04-04 DIAGNOSIS — Z86711 Personal history of pulmonary embolism: Secondary | ICD-10-CM | POA: Diagnosis not present

## 2019-04-04 DIAGNOSIS — Z1331 Encounter for screening for depression: Secondary | ICD-10-CM | POA: Diagnosis not present

## 2019-04-04 DIAGNOSIS — G4700 Insomnia, unspecified: Secondary | ICD-10-CM | POA: Diagnosis not present

## 2019-04-04 DIAGNOSIS — Z9181 History of falling: Secondary | ICD-10-CM | POA: Diagnosis not present

## 2019-04-04 DIAGNOSIS — K5090 Crohn's disease, unspecified, without complications: Secondary | ICD-10-CM | POA: Diagnosis not present

## 2019-04-04 DIAGNOSIS — Z79899 Other long term (current) drug therapy: Secondary | ICD-10-CM | POA: Diagnosis not present

## 2019-04-05 DIAGNOSIS — K50818 Crohn's disease of both small and large intestine with other complication: Secondary | ICD-10-CM | POA: Diagnosis not present

## 2019-04-05 DIAGNOSIS — M25532 Pain in left wrist: Secondary | ICD-10-CM | POA: Diagnosis not present

## 2019-04-05 DIAGNOSIS — M4690 Unspecified inflammatory spondylopathy, site unspecified: Secondary | ICD-10-CM | POA: Diagnosis not present

## 2019-04-05 DIAGNOSIS — M0589 Other rheumatoid arthritis with rheumatoid factor of multiple sites: Secondary | ICD-10-CM | POA: Diagnosis not present

## 2019-04-05 DIAGNOSIS — Z6827 Body mass index (BMI) 27.0-27.9, adult: Secondary | ICD-10-CM | POA: Diagnosis not present

## 2019-04-05 DIAGNOSIS — Z79899 Other long term (current) drug therapy: Secondary | ICD-10-CM | POA: Diagnosis not present

## 2019-04-05 DIAGNOSIS — M25531 Pain in right wrist: Secondary | ICD-10-CM | POA: Diagnosis not present

## 2019-04-05 DIAGNOSIS — E663 Overweight: Secondary | ICD-10-CM | POA: Diagnosis not present

## 2019-04-05 DIAGNOSIS — M1009 Idiopathic gout, multiple sites: Secondary | ICD-10-CM | POA: Diagnosis not present

## 2019-04-06 DIAGNOSIS — K50818 Crohn's disease of both small and large intestine with other complication: Secondary | ICD-10-CM | POA: Diagnosis not present

## 2019-05-10 DIAGNOSIS — G2581 Restless legs syndrome: Secondary | ICD-10-CM | POA: Diagnosis not present

## 2019-05-10 DIAGNOSIS — Z6825 Body mass index (BMI) 25.0-25.9, adult: Secondary | ICD-10-CM | POA: Diagnosis not present

## 2019-05-15 DIAGNOSIS — L308 Other specified dermatitis: Secondary | ICD-10-CM | POA: Diagnosis not present

## 2019-06-01 DIAGNOSIS — K50818 Crohn's disease of both small and large intestine with other complication: Secondary | ICD-10-CM | POA: Diagnosis not present

## 2019-07-27 DIAGNOSIS — M0589 Other rheumatoid arthritis with rheumatoid factor of multiple sites: Secondary | ICD-10-CM | POA: Diagnosis not present

## 2019-07-27 DIAGNOSIS — K50818 Crohn's disease of both small and large intestine with other complication: Secondary | ICD-10-CM | POA: Diagnosis not present

## 2019-09-14 IMAGING — CT CT ABD-PELV W/ CM
2 of 5 series · 15 of 46 positions shown, 17 images · IV contrast (omnipaque)
Comparison: Abdominopelvic CT 09/19/2015 and 03/23/2012.

CLINICAL DATA: Chronic diarrhea and weight loss. Anemia. History of
total colectomy for Crohn's disease in 5841.

EXAM:
CT ABDOMEN AND PELVIS WITH CONTRAST
TECHNIQUE: Multidetector CT imaging of the abdomen and pelvis was performed
using the standard protocol following bolus administration of
intravenous contrast.
CONTRAST:  100mL OMNIPAQUE IOHEXOL 300 MG/ML  SOLN

[Series 2: abd pelvis 5.00 br40 s3 ax · axial · 0.69mm/px · z∈[+1105,+1515]mm · 12 of 92 slices shown, 14 images]
[im 5/92  soft-tissue]
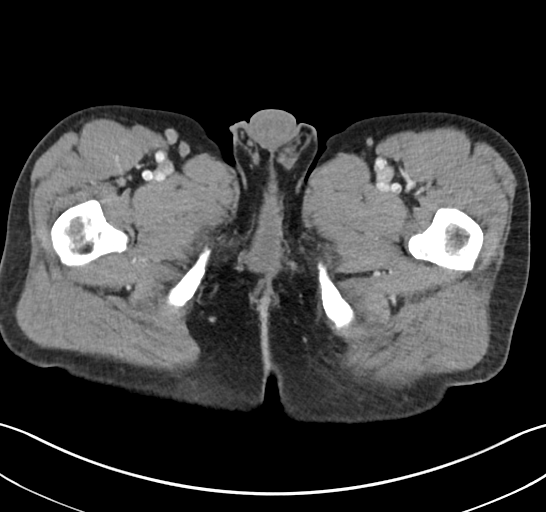
[im 5/92  bone]
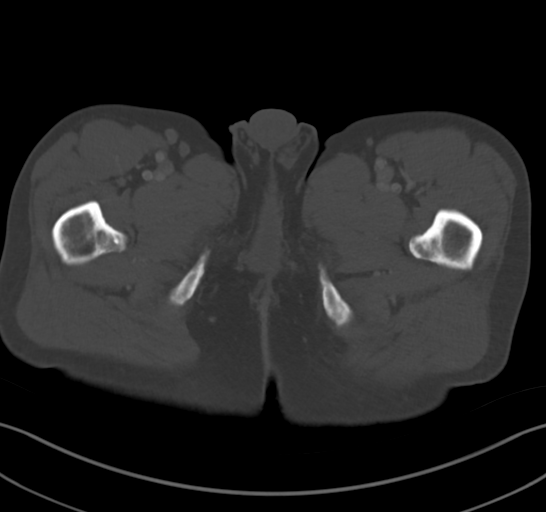
[im 15/92  soft-tissue]
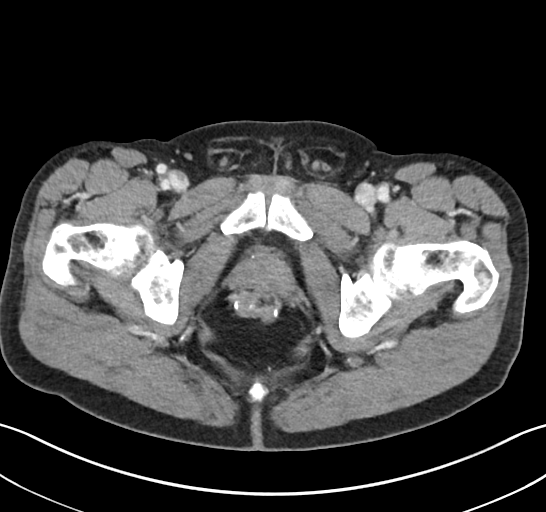
[im 20/92  soft-tissue]
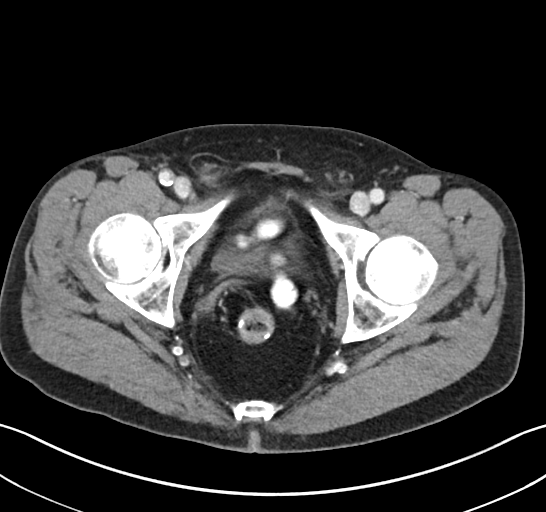
[im 29/92  soft-tissue]
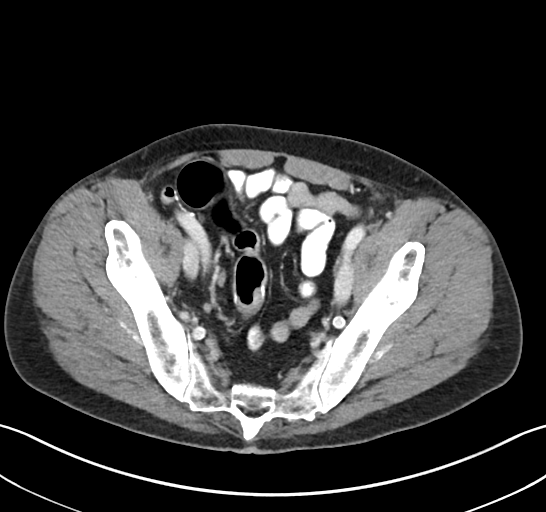
[im 34/92  soft-tissue]
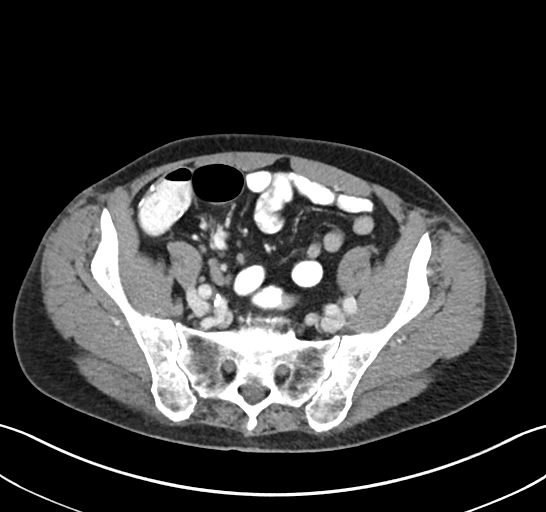
[im 44/92  soft-tissue]
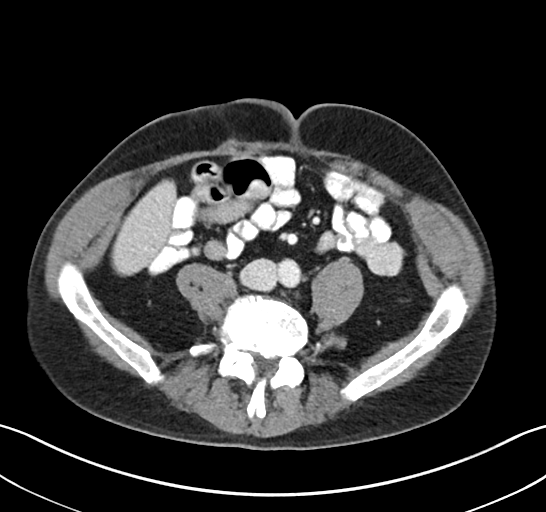
[im 48/92  soft-tissue]
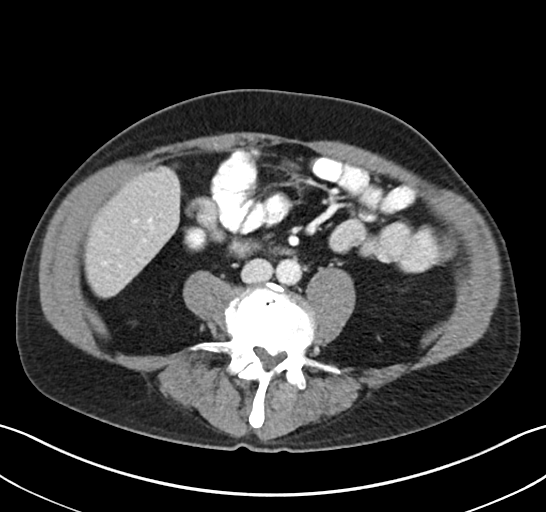
[im 58/92  soft-tissue]
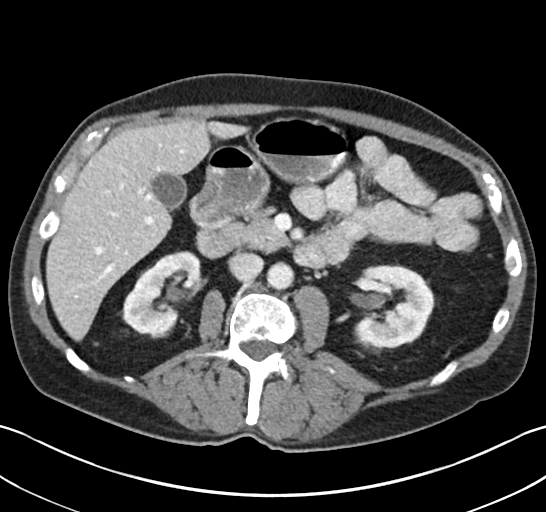
[im 63/92  soft-tissue]
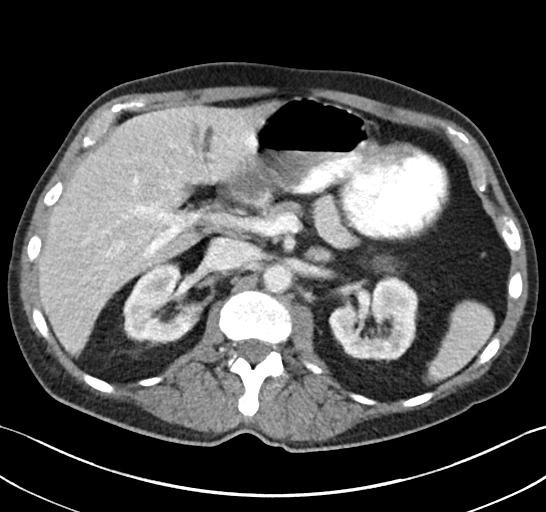
[im 63/92  bone]
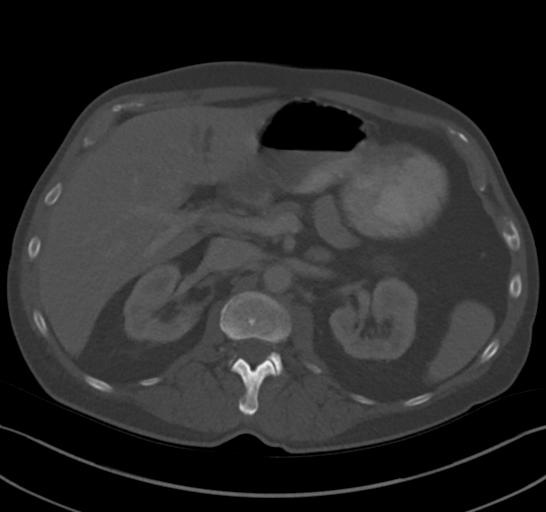
[im 72/92  soft-tissue]
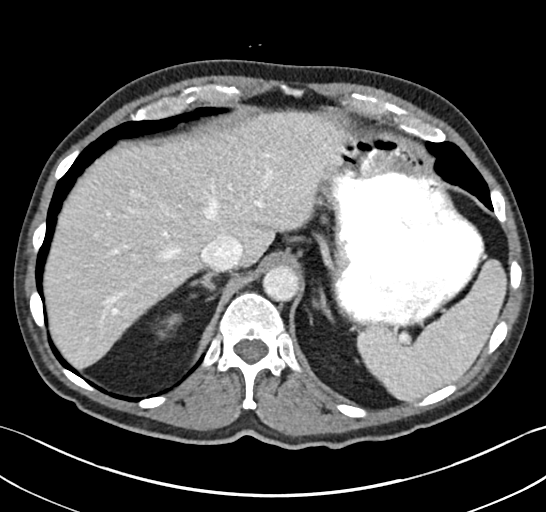
[im 77/92  soft-tissue]
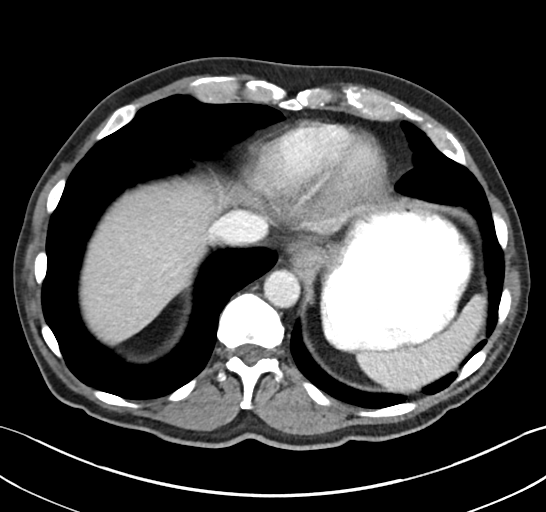
[im 87/92  soft-tissue]
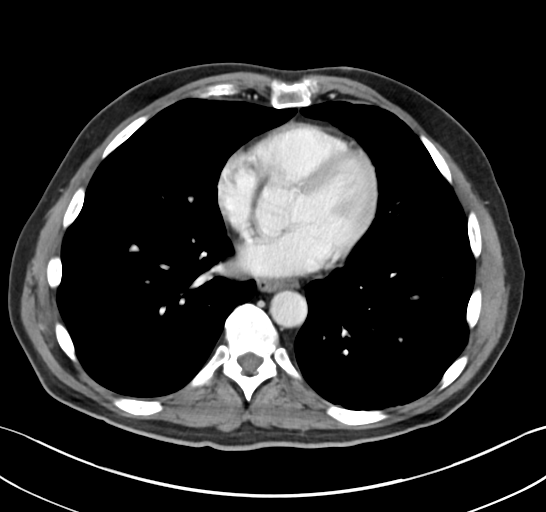

[Series 6: abd pelvis 2.00 br40 s3 cor · coronal · 0.73mm/px · 3 of 138 slices shown]
[im 46/138  soft-tissue]
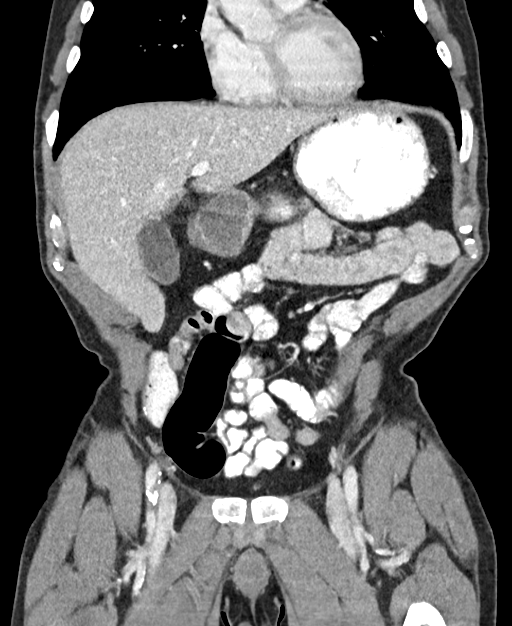
[im 61/138  soft-tissue]
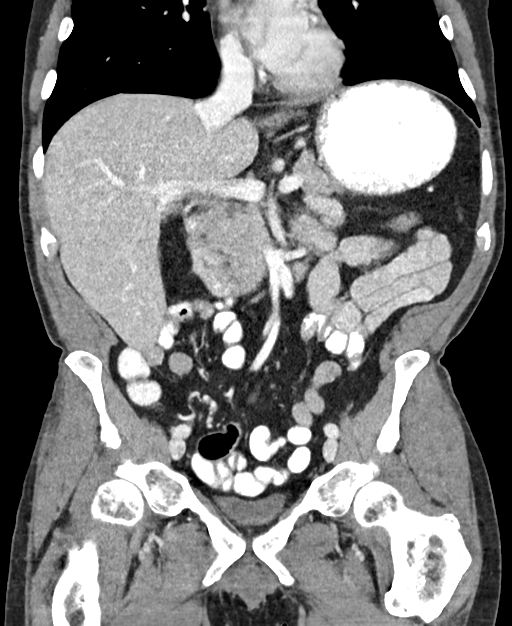
[im 77/138  soft-tissue]
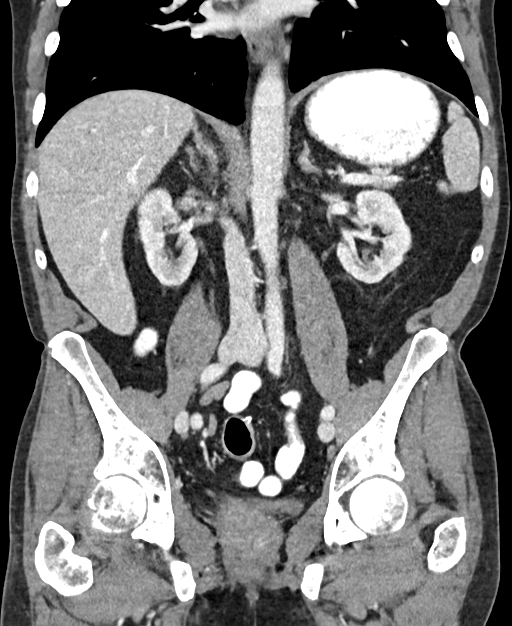

[15 of 46 positions shown; findings below may reference images not displayed]

FINDINGS: Lower chest: Interval resolved loculated fluid at the left
costophrenic angle. There is mild left basilar scarring. The lung
bases are otherwise clear. There is coronary artery atherosclerosis
and possible aortic valvular calcifications.

Hepatobiliary: The liver is normal in density without focal
abnormality. No evidence of gallstones, gallbladder wall thickening
or biliary dilatation.

Pancreas: Unremarkable. No pancreatic ductal dilatation or
surrounding inflammatory changes.

Spleen: Normal in size without focal abnormality.

Adrenals/Urinary Tract: Both adrenal glands appear normal. There is
a 4 mm nonobstructing calculus in the lower pole of the left kidney
(image 37/2). No other urinary tract calculi are demonstrated. There
is a probable 9 mm cyst in the lower pole of the right kidney. The
kidneys, ureters and bladder otherwise appear normal.

Stomach/Bowel: The stomach and proximal small bowel appear normal.
There are stable small bowel anastomosis clips in the right lower
quadrant. Patient is status post subtotal colectomy with an end to
side ileorectal anastomosis. There is no small bowel wall
thickening, surrounding inflammation or bowel dilatation.

Vascular/Lymphatic: There are stable prominent ileocolonic
mesenteric lymph nodes in the right pelvis, measuring up to 11 mm on
image 62/2, likely reactive. Likewise, there is stable mildly
prominent external iliac lymph nodes bilaterally. There is no
abdominal lymphadenopathy. Mild aortic and branch vessel
atherosclerosis without acute vascular findings. IVC filter has been
removed in the interval.

Reproductive: The prostate gland and seminal vesicles appear normal.

Other: Resolving postsurgical changes in the right anterior
abdominal wall. No hernia or ascites.

Musculoskeletal: No acute or significant osseous findings.
Multilobar spondylosis associated with a convex left scoliosis.
IMPRESSION: 1. No acute findings or explanation for the patient's symptoms. The
small bowel has a stable appearance status post subtotal colectomy.
There is no bowel wall thickening or surrounding inflammation.
2. Stable reactive pelvic lymphadenopathy.
3. Nonobstructing left renal calculus.

## 2019-09-21 DIAGNOSIS — K50818 Crohn's disease of both small and large intestine with other complication: Secondary | ICD-10-CM | POA: Diagnosis not present

## 2019-09-21 DIAGNOSIS — Z79899 Other long term (current) drug therapy: Secondary | ICD-10-CM | POA: Diagnosis not present

## 2019-09-21 DIAGNOSIS — M0589 Other rheumatoid arthritis with rheumatoid factor of multiple sites: Secondary | ICD-10-CM | POA: Diagnosis not present

## 2019-09-21 DIAGNOSIS — D500 Iron deficiency anemia secondary to blood loss (chronic): Secondary | ICD-10-CM | POA: Diagnosis not present

## 2019-10-08 DIAGNOSIS — M4690 Unspecified inflammatory spondylopathy, site unspecified: Secondary | ICD-10-CM | POA: Diagnosis not present

## 2019-10-08 DIAGNOSIS — K50818 Crohn's disease of both small and large intestine with other complication: Secondary | ICD-10-CM | POA: Diagnosis not present

## 2019-10-08 DIAGNOSIS — M0589 Other rheumatoid arthritis with rheumatoid factor of multiple sites: Secondary | ICD-10-CM | POA: Diagnosis not present

## 2019-10-08 DIAGNOSIS — M1009 Idiopathic gout, multiple sites: Secondary | ICD-10-CM | POA: Diagnosis not present

## 2019-10-08 DIAGNOSIS — Z6825 Body mass index (BMI) 25.0-25.9, adult: Secondary | ICD-10-CM | POA: Diagnosis not present

## 2019-10-08 DIAGNOSIS — M2550 Pain in unspecified joint: Secondary | ICD-10-CM | POA: Diagnosis not present

## 2019-10-08 DIAGNOSIS — D649 Anemia, unspecified: Secondary | ICD-10-CM | POA: Diagnosis not present

## 2019-10-08 DIAGNOSIS — Z79899 Other long term (current) drug therapy: Secondary | ICD-10-CM | POA: Diagnosis not present

## 2019-10-08 DIAGNOSIS — E663 Overweight: Secondary | ICD-10-CM | POA: Diagnosis not present

## 2019-10-15 DIAGNOSIS — Z23 Encounter for immunization: Secondary | ICD-10-CM | POA: Diagnosis not present

## 2019-10-23 DIAGNOSIS — K50818 Crohn's disease of both small and large intestine with other complication: Secondary | ICD-10-CM | POA: Diagnosis not present

## 2019-10-23 DIAGNOSIS — D500 Iron deficiency anemia secondary to blood loss (chronic): Secondary | ICD-10-CM | POA: Diagnosis not present

## 2019-10-23 DIAGNOSIS — M1009 Idiopathic gout, multiple sites: Secondary | ICD-10-CM | POA: Diagnosis not present

## 2019-11-15 DIAGNOSIS — K50818 Crohn's disease of both small and large intestine with other complication: Secondary | ICD-10-CM | POA: Diagnosis not present

## 2019-11-15 DIAGNOSIS — M0589 Other rheumatoid arthritis with rheumatoid factor of multiple sites: Secondary | ICD-10-CM | POA: Diagnosis not present

## 2019-11-15 DIAGNOSIS — M1009 Idiopathic gout, multiple sites: Secondary | ICD-10-CM | POA: Diagnosis not present

## 2019-11-15 DIAGNOSIS — D649 Anemia, unspecified: Secondary | ICD-10-CM | POA: Diagnosis not present

## 2019-11-15 DIAGNOSIS — E663 Overweight: Secondary | ICD-10-CM | POA: Diagnosis not present

## 2019-11-15 DIAGNOSIS — M2550 Pain in unspecified joint: Secondary | ICD-10-CM | POA: Diagnosis not present

## 2019-11-15 DIAGNOSIS — Z79899 Other long term (current) drug therapy: Secondary | ICD-10-CM | POA: Diagnosis not present

## 2019-11-15 DIAGNOSIS — M4690 Unspecified inflammatory spondylopathy, site unspecified: Secondary | ICD-10-CM | POA: Diagnosis not present

## 2019-11-15 DIAGNOSIS — Z6825 Body mass index (BMI) 25.0-25.9, adult: Secondary | ICD-10-CM | POA: Diagnosis not present

## 2019-11-19 DIAGNOSIS — M0589 Other rheumatoid arthritis with rheumatoid factor of multiple sites: Secondary | ICD-10-CM | POA: Diagnosis not present

## 2019-11-20 DIAGNOSIS — Z86711 Personal history of pulmonary embolism: Secondary | ICD-10-CM | POA: Diagnosis not present

## 2019-11-20 DIAGNOSIS — Z9181 History of falling: Secondary | ICD-10-CM | POA: Diagnosis not present

## 2019-11-20 DIAGNOSIS — G2581 Restless legs syndrome: Secondary | ICD-10-CM | POA: Diagnosis not present

## 2019-11-20 DIAGNOSIS — Z6825 Body mass index (BMI) 25.0-25.9, adult: Secondary | ICD-10-CM | POA: Diagnosis not present

## 2019-11-20 DIAGNOSIS — I10 Essential (primary) hypertension: Secondary | ICD-10-CM | POA: Diagnosis not present

## 2019-11-20 DIAGNOSIS — Z79899 Other long term (current) drug therapy: Secondary | ICD-10-CM | POA: Diagnosis not present

## 2019-11-20 DIAGNOSIS — Z1331 Encounter for screening for depression: Secondary | ICD-10-CM | POA: Diagnosis not present

## 2019-11-20 DIAGNOSIS — M47819 Spondylosis without myelopathy or radiculopathy, site unspecified: Secondary | ICD-10-CM | POA: Diagnosis not present

## 2019-11-20 DIAGNOSIS — G4700 Insomnia, unspecified: Secondary | ICD-10-CM | POA: Diagnosis not present

## 2019-11-20 DIAGNOSIS — K5090 Crohn's disease, unspecified, without complications: Secondary | ICD-10-CM | POA: Diagnosis not present

## 2019-11-20 DIAGNOSIS — M109 Gout, unspecified: Secondary | ICD-10-CM | POA: Diagnosis not present

## 2019-11-20 DIAGNOSIS — K219 Gastro-esophageal reflux disease without esophagitis: Secondary | ICD-10-CM | POA: Diagnosis not present

## 2019-12-03 DIAGNOSIS — M0589 Other rheumatoid arthritis with rheumatoid factor of multiple sites: Secondary | ICD-10-CM | POA: Diagnosis not present

## 2019-12-03 DIAGNOSIS — K50818 Crohn's disease of both small and large intestine with other complication: Secondary | ICD-10-CM | POA: Diagnosis not present

## 2020-01-01 DIAGNOSIS — T887XXA Unspecified adverse effect of drug or medicament, initial encounter: Secondary | ICD-10-CM | POA: Diagnosis not present

## 2020-01-01 DIAGNOSIS — Z79899 Other long term (current) drug therapy: Secondary | ICD-10-CM | POA: Diagnosis not present

## 2020-01-01 DIAGNOSIS — K50818 Crohn's disease of both small and large intestine with other complication: Secondary | ICD-10-CM | POA: Diagnosis not present

## 2020-01-01 DIAGNOSIS — M0589 Other rheumatoid arthritis with rheumatoid factor of multiple sites: Secondary | ICD-10-CM | POA: Diagnosis not present

## 2020-01-01 DIAGNOSIS — T50905A Adverse effect of unspecified drugs, medicaments and biological substances, initial encounter: Secondary | ICD-10-CM | POA: Diagnosis not present

## 2020-01-07 DIAGNOSIS — E663 Overweight: Secondary | ICD-10-CM | POA: Diagnosis not present

## 2020-01-07 DIAGNOSIS — M2550 Pain in unspecified joint: Secondary | ICD-10-CM | POA: Diagnosis not present

## 2020-01-07 DIAGNOSIS — M0589 Other rheumatoid arthritis with rheumatoid factor of multiple sites: Secondary | ICD-10-CM | POA: Diagnosis not present

## 2020-01-07 DIAGNOSIS — Z6826 Body mass index (BMI) 26.0-26.9, adult: Secondary | ICD-10-CM | POA: Diagnosis not present

## 2020-01-07 DIAGNOSIS — M1009 Idiopathic gout, multiple sites: Secondary | ICD-10-CM | POA: Diagnosis not present

## 2020-01-07 DIAGNOSIS — K5080 Crohn's disease of both small and large intestine without complications: Secondary | ICD-10-CM | POA: Diagnosis not present

## 2020-01-07 DIAGNOSIS — Z79899 Other long term (current) drug therapy: Secondary | ICD-10-CM | POA: Diagnosis not present

## 2020-01-31 DIAGNOSIS — M0589 Other rheumatoid arthritis with rheumatoid factor of multiple sites: Secondary | ICD-10-CM | POA: Diagnosis not present

## 2020-01-31 DIAGNOSIS — K50818 Crohn's disease of both small and large intestine with other complication: Secondary | ICD-10-CM | POA: Diagnosis not present

## 2020-02-28 DIAGNOSIS — M0589 Other rheumatoid arthritis with rheumatoid factor of multiple sites: Secondary | ICD-10-CM | POA: Diagnosis not present

## 2020-02-28 DIAGNOSIS — K50818 Crohn's disease of both small and large intestine with other complication: Secondary | ICD-10-CM | POA: Diagnosis not present

## 2020-03-25 DIAGNOSIS — L308 Other specified dermatitis: Secondary | ICD-10-CM | POA: Diagnosis not present

## 2020-04-07 DIAGNOSIS — K5080 Crohn's disease of both small and large intestine without complications: Secondary | ICD-10-CM | POA: Diagnosis not present

## 2020-04-07 DIAGNOSIS — Z6825 Body mass index (BMI) 25.0-25.9, adult: Secondary | ICD-10-CM | POA: Diagnosis not present

## 2020-04-07 DIAGNOSIS — Z79899 Other long term (current) drug therapy: Secondary | ICD-10-CM | POA: Diagnosis not present

## 2020-04-07 DIAGNOSIS — M1009 Idiopathic gout, multiple sites: Secondary | ICD-10-CM | POA: Diagnosis not present

## 2020-04-07 DIAGNOSIS — M0589 Other rheumatoid arthritis with rheumatoid factor of multiple sites: Secondary | ICD-10-CM | POA: Diagnosis not present

## 2020-04-07 DIAGNOSIS — E663 Overweight: Secondary | ICD-10-CM | POA: Diagnosis not present

## 2020-04-24 DIAGNOSIS — M0589 Other rheumatoid arthritis with rheumatoid factor of multiple sites: Secondary | ICD-10-CM | POA: Diagnosis not present

## 2020-04-24 DIAGNOSIS — Z79899 Other long term (current) drug therapy: Secondary | ICD-10-CM | POA: Diagnosis not present

## 2020-04-24 DIAGNOSIS — K50818 Crohn's disease of both small and large intestine with other complication: Secondary | ICD-10-CM | POA: Diagnosis not present

## 2020-04-26 DIAGNOSIS — Z23 Encounter for immunization: Secondary | ICD-10-CM | POA: Diagnosis not present

## 2020-05-19 DIAGNOSIS — Z6824 Body mass index (BMI) 24.0-24.9, adult: Secondary | ICD-10-CM | POA: Diagnosis not present

## 2020-05-19 DIAGNOSIS — I10 Essential (primary) hypertension: Secondary | ICD-10-CM | POA: Diagnosis not present

## 2020-05-19 DIAGNOSIS — Z86711 Personal history of pulmonary embolism: Secondary | ICD-10-CM | POA: Diagnosis not present

## 2020-05-19 DIAGNOSIS — G4700 Insomnia, unspecified: Secondary | ICD-10-CM | POA: Diagnosis not present

## 2020-05-19 DIAGNOSIS — M47819 Spondylosis without myelopathy or radiculopathy, site unspecified: Secondary | ICD-10-CM | POA: Diagnosis not present

## 2020-05-19 DIAGNOSIS — K219 Gastro-esophageal reflux disease without esophagitis: Secondary | ICD-10-CM | POA: Diagnosis not present

## 2020-05-19 DIAGNOSIS — K5090 Crohn's disease, unspecified, without complications: Secondary | ICD-10-CM | POA: Diagnosis not present

## 2020-05-19 DIAGNOSIS — M109 Gout, unspecified: Secondary | ICD-10-CM | POA: Diagnosis not present

## 2020-05-19 DIAGNOSIS — Z79899 Other long term (current) drug therapy: Secondary | ICD-10-CM | POA: Diagnosis not present

## 2020-05-19 DIAGNOSIS — G2581 Restless legs syndrome: Secondary | ICD-10-CM | POA: Diagnosis not present

## 2020-05-19 DIAGNOSIS — Z139 Encounter for screening, unspecified: Secondary | ICD-10-CM | POA: Diagnosis not present

## 2020-06-19 DIAGNOSIS — M0589 Other rheumatoid arthritis with rheumatoid factor of multiple sites: Secondary | ICD-10-CM | POA: Diagnosis not present

## 2020-06-19 DIAGNOSIS — K50818 Crohn's disease of both small and large intestine with other complication: Secondary | ICD-10-CM | POA: Diagnosis not present

## 2020-08-14 DIAGNOSIS — K50818 Crohn's disease of both small and large intestine with other complication: Secondary | ICD-10-CM | POA: Diagnosis not present

## 2020-08-14 DIAGNOSIS — Z79899 Other long term (current) drug therapy: Secondary | ICD-10-CM | POA: Diagnosis not present

## 2020-08-14 DIAGNOSIS — M0589 Other rheumatoid arthritis with rheumatoid factor of multiple sites: Secondary | ICD-10-CM | POA: Diagnosis not present

## 2020-09-16 DIAGNOSIS — D649 Anemia, unspecified: Secondary | ICD-10-CM | POA: Diagnosis not present

## 2020-09-25 DIAGNOSIS — Z1331 Encounter for screening for depression: Secondary | ICD-10-CM | POA: Diagnosis not present

## 2020-09-25 DIAGNOSIS — Z0000 Encounter for general adult medical examination without abnormal findings: Secondary | ICD-10-CM | POA: Diagnosis not present

## 2020-09-25 DIAGNOSIS — Z9181 History of falling: Secondary | ICD-10-CM | POA: Diagnosis not present

## 2020-09-25 DIAGNOSIS — E785 Hyperlipidemia, unspecified: Secondary | ICD-10-CM | POA: Diagnosis not present

## 2020-10-08 DIAGNOSIS — Z6823 Body mass index (BMI) 23.0-23.9, adult: Secondary | ICD-10-CM | POA: Diagnosis not present

## 2020-10-08 DIAGNOSIS — K5080 Crohn's disease of both small and large intestine without complications: Secondary | ICD-10-CM | POA: Diagnosis not present

## 2020-10-08 DIAGNOSIS — M2550 Pain in unspecified joint: Secondary | ICD-10-CM | POA: Diagnosis not present

## 2020-10-08 DIAGNOSIS — M0589 Other rheumatoid arthritis with rheumatoid factor of multiple sites: Secondary | ICD-10-CM | POA: Diagnosis not present

## 2020-10-08 DIAGNOSIS — Z79899 Other long term (current) drug therapy: Secondary | ICD-10-CM | POA: Diagnosis not present

## 2020-10-08 DIAGNOSIS — M1009 Idiopathic gout, multiple sites: Secondary | ICD-10-CM | POA: Diagnosis not present

## 2020-10-09 DIAGNOSIS — M0589 Other rheumatoid arthritis with rheumatoid factor of multiple sites: Secondary | ICD-10-CM | POA: Diagnosis not present

## 2020-10-09 DIAGNOSIS — K50818 Crohn's disease of both small and large intestine with other complication: Secondary | ICD-10-CM | POA: Diagnosis not present

## 2020-11-05 DIAGNOSIS — Z23 Encounter for immunization: Secondary | ICD-10-CM | POA: Diagnosis not present

## 2020-11-20 DIAGNOSIS — D649 Anemia, unspecified: Secondary | ICD-10-CM | POA: Diagnosis not present

## 2020-11-20 DIAGNOSIS — G2581 Restless legs syndrome: Secondary | ICD-10-CM | POA: Diagnosis not present

## 2020-11-20 DIAGNOSIS — K219 Gastro-esophageal reflux disease without esophagitis: Secondary | ICD-10-CM | POA: Diagnosis not present

## 2020-11-20 DIAGNOSIS — K5090 Crohn's disease, unspecified, without complications: Secondary | ICD-10-CM | POA: Diagnosis not present

## 2020-11-20 DIAGNOSIS — I10 Essential (primary) hypertension: Secondary | ICD-10-CM | POA: Diagnosis not present

## 2020-11-20 DIAGNOSIS — Z79899 Other long term (current) drug therapy: Secondary | ICD-10-CM | POA: Diagnosis not present

## 2020-11-20 DIAGNOSIS — Z6823 Body mass index (BMI) 23.0-23.9, adult: Secondary | ICD-10-CM | POA: Diagnosis not present

## 2020-11-20 DIAGNOSIS — M47819 Spondylosis without myelopathy or radiculopathy, site unspecified: Secondary | ICD-10-CM | POA: Diagnosis not present

## 2020-11-20 DIAGNOSIS — Z86711 Personal history of pulmonary embolism: Secondary | ICD-10-CM | POA: Diagnosis not present

## 2020-11-20 DIAGNOSIS — M109 Gout, unspecified: Secondary | ICD-10-CM | POA: Diagnosis not present

## 2020-11-20 DIAGNOSIS — E781 Pure hyperglyceridemia: Secondary | ICD-10-CM | POA: Diagnosis not present

## 2020-11-20 DIAGNOSIS — G4700 Insomnia, unspecified: Secondary | ICD-10-CM | POA: Diagnosis not present

## 2020-12-09 DIAGNOSIS — M0589 Other rheumatoid arthritis with rheumatoid factor of multiple sites: Secondary | ICD-10-CM | POA: Diagnosis not present

## 2020-12-09 DIAGNOSIS — K50818 Crohn's disease of both small and large intestine with other complication: Secondary | ICD-10-CM | POA: Diagnosis not present

## 2020-12-17 DIAGNOSIS — L308 Other specified dermatitis: Secondary | ICD-10-CM | POA: Diagnosis not present

## 2020-12-17 DIAGNOSIS — D2239 Melanocytic nevi of other parts of face: Secondary | ICD-10-CM | POA: Diagnosis not present

## 2021-01-14 DIAGNOSIS — L308 Other specified dermatitis: Secondary | ICD-10-CM | POA: Diagnosis not present

## 2021-01-15 DIAGNOSIS — M0589 Other rheumatoid arthritis with rheumatoid factor of multiple sites: Secondary | ICD-10-CM | POA: Diagnosis not present

## 2021-01-15 DIAGNOSIS — K5080 Crohn's disease of both small and large intestine without complications: Secondary | ICD-10-CM | POA: Diagnosis not present

## 2021-01-15 DIAGNOSIS — M1009 Idiopathic gout, multiple sites: Secondary | ICD-10-CM | POA: Diagnosis not present

## 2021-01-15 DIAGNOSIS — Z6824 Body mass index (BMI) 24.0-24.9, adult: Secondary | ICD-10-CM | POA: Diagnosis not present

## 2021-01-15 DIAGNOSIS — M2550 Pain in unspecified joint: Secondary | ICD-10-CM | POA: Diagnosis not present

## 2021-01-15 DIAGNOSIS — Z79899 Other long term (current) drug therapy: Secondary | ICD-10-CM | POA: Diagnosis not present

## 2021-02-09 DIAGNOSIS — Z111 Encounter for screening for respiratory tuberculosis: Secondary | ICD-10-CM | POA: Diagnosis not present

## 2021-02-09 DIAGNOSIS — M0589 Other rheumatoid arthritis with rheumatoid factor of multiple sites: Secondary | ICD-10-CM | POA: Diagnosis not present

## 2021-02-09 DIAGNOSIS — R5383 Other fatigue: Secondary | ICD-10-CM | POA: Diagnosis not present

## 2021-02-09 DIAGNOSIS — K50818 Crohn's disease of both small and large intestine with other complication: Secondary | ICD-10-CM | POA: Diagnosis not present

## 2021-04-06 DIAGNOSIS — K50818 Crohn's disease of both small and large intestine with other complication: Secondary | ICD-10-CM | POA: Diagnosis not present

## 2021-04-06 DIAGNOSIS — M0589 Other rheumatoid arthritis with rheumatoid factor of multiple sites: Secondary | ICD-10-CM | POA: Diagnosis not present

## 2021-05-20 DIAGNOSIS — R21 Rash and other nonspecific skin eruption: Secondary | ICD-10-CM | POA: Diagnosis not present

## 2021-05-20 DIAGNOSIS — K219 Gastro-esophageal reflux disease without esophagitis: Secondary | ICD-10-CM | POA: Diagnosis not present

## 2021-05-20 DIAGNOSIS — D649 Anemia, unspecified: Secondary | ICD-10-CM | POA: Diagnosis not present

## 2021-05-20 DIAGNOSIS — G4700 Insomnia, unspecified: Secondary | ICD-10-CM | POA: Diagnosis not present

## 2021-05-20 DIAGNOSIS — I10 Essential (primary) hypertension: Secondary | ICD-10-CM | POA: Diagnosis not present

## 2021-06-01 DIAGNOSIS — M0589 Other rheumatoid arthritis with rheumatoid factor of multiple sites: Secondary | ICD-10-CM | POA: Diagnosis not present

## 2021-06-01 DIAGNOSIS — K50818 Crohn's disease of both small and large intestine with other complication: Secondary | ICD-10-CM | POA: Diagnosis not present

## 2021-06-04 ENCOUNTER — Other Ambulatory Visit: Payer: Self-pay

## 2021-06-04 ENCOUNTER — Other Ambulatory Visit: Payer: Self-pay | Admitting: Hematology and Oncology

## 2021-06-04 DIAGNOSIS — D649 Anemia, unspecified: Secondary | ICD-10-CM

## 2021-06-05 ENCOUNTER — Inpatient Hospital Stay: Payer: Medicare Other

## 2021-06-05 ENCOUNTER — Inpatient Hospital Stay: Payer: Medicare Other | Attending: Hematology and Oncology | Admitting: Hematology and Oncology

## 2021-06-05 ENCOUNTER — Encounter: Payer: Self-pay | Admitting: Hematology and Oncology

## 2021-06-05 ENCOUNTER — Other Ambulatory Visit: Payer: Self-pay

## 2021-06-05 DIAGNOSIS — Z7901 Long term (current) use of anticoagulants: Secondary | ICD-10-CM

## 2021-06-05 DIAGNOSIS — K209 Esophagitis, unspecified without bleeding: Secondary | ICD-10-CM

## 2021-06-05 DIAGNOSIS — Z86718 Personal history of other venous thrombosis and embolism: Secondary | ICD-10-CM

## 2021-06-05 DIAGNOSIS — D539 Nutritional anemia, unspecified: Secondary | ICD-10-CM

## 2021-06-05 DIAGNOSIS — K5190 Ulcerative colitis, unspecified, without complications: Secondary | ICD-10-CM

## 2021-06-05 DIAGNOSIS — K449 Diaphragmatic hernia without obstruction or gangrene: Secondary | ICD-10-CM | POA: Diagnosis not present

## 2021-06-05 DIAGNOSIS — D500 Iron deficiency anemia secondary to blood loss (chronic): Secondary | ICD-10-CM | POA: Diagnosis not present

## 2021-06-05 DIAGNOSIS — Z9049 Acquired absence of other specified parts of digestive tract: Secondary | ICD-10-CM | POA: Diagnosis not present

## 2021-06-05 DIAGNOSIS — D6862 Lupus anticoagulant syndrome: Secondary | ICD-10-CM

## 2021-06-05 DIAGNOSIS — M069 Rheumatoid arthritis, unspecified: Secondary | ICD-10-CM

## 2021-06-05 DIAGNOSIS — K5090 Crohn's disease, unspecified, without complications: Secondary | ICD-10-CM

## 2021-06-05 DIAGNOSIS — R7402 Elevation of levels of lactic acid dehydrogenase (LDH): Secondary | ICD-10-CM | POA: Diagnosis not present

## 2021-06-05 DIAGNOSIS — D649 Anemia, unspecified: Secondary | ICD-10-CM

## 2021-06-05 DIAGNOSIS — Z86711 Personal history of pulmonary embolism: Secondary | ICD-10-CM

## 2021-06-05 HISTORY — DX: Nutritional anemia, unspecified: D53.9

## 2021-06-05 LAB — TSH: TSH: 2.142 u[IU]/mL (ref 0.350–4.500)

## 2021-06-05 LAB — BASIC METABOLIC PANEL
BUN: 18 (ref 4–21)
CO2: 22 (ref 13–22)
Chloride: 110 — AB (ref 99–108)
Creatinine: 1.2 (ref 0.6–1.3)
Glucose: 97
Potassium: 3.8 mEq/L (ref 3.5–5.1)
Sodium: 144 (ref 137–147)

## 2021-06-05 LAB — CBC
MCV: 102 — AB (ref 80–94)
RBC: 2.9 — AB (ref 3.87–5.11)

## 2021-06-05 LAB — CBC AND DIFFERENTIAL
HCT: 30 — AB (ref 41–53)
Hemoglobin: 9.9 — AB (ref 13.5–17.5)
Neutrophils Absolute: 3.07
Platelets: 259 10*3/uL (ref 150–400)
WBC: 5.9

## 2021-06-05 LAB — HEPATIC FUNCTION PANEL
ALT: 31 U/L (ref 10–40)
AST: 44 — AB (ref 14–40)
Alkaline Phosphatase: 51 (ref 25–125)
Bilirubin, Total: 0.3

## 2021-06-05 LAB — IRON AND TIBC
Iron: 56 ug/dL (ref 45–182)
Saturation Ratios: 23 % (ref 17.9–39.5)
TIBC: 247 ug/dL — ABNORMAL LOW (ref 250–450)
UIBC: 191 ug/dL

## 2021-06-05 LAB — COMPREHENSIVE METABOLIC PANEL
Albumin: 4.2 (ref 3.5–5.0)
Calcium: 9 (ref 8.7–10.7)

## 2021-06-05 LAB — FOLATE: Folate: 36.4 ng/mL (ref >5.9)

## 2021-06-05 LAB — FERRITIN: Ferritin: 212 ng/mL (ref 24–336)

## 2021-06-05 LAB — LACTATE DEHYDROGENASE: LDH: 209 U/L — ABNORMAL HIGH (ref 98–192)

## 2021-06-05 LAB — VITAMIN B12: Vitamin B-12: 558 pg/mL (ref 180–914)

## 2021-06-05 NOTE — Progress Notes (Unsigned)
[facility]  [address] [place],  [place]  [address] [phone]  Clinic Day:  06/05/2021  Referring physician: Ronal Fear, NP   REASON FOR CONSULTATION:  Macrocytic anemia  HISTORY OF PRESENT ILLNESS:  Frank Dodson is a 71 y.o. male with a history of rheumatoid arthritis with macrocytic anemia felt to potentially be due to methotrexate.  Methotrexate was discontinued and his anemia has not improved, so he is referred in consultation by Zenovia Jordan, MD for assessment and management.  He states he has been on and off methotrexate for about 10 years.  He has Crohn's disease and ulcerative colitis, so underwent total colectomy in 2016.  He states he has been chronically anemic and on ferrous sulfate twice daily for about 13 or 14 years.  He is on chronic anticoagulation with Xarelto 20 mg daily.  He denies significant fatigue.  He denies shortness of breath, chest pain or lightheadedness.  He denies hematemesis, melena or hematochezia.  He denies hematuria.  He has chronic diarrhea since his colectomy, but otherwise denies any abdominal symptoms.  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  He developed bilateral pulmonary emboli in July 2017.  He was hospitalized and received IV heparin.  He then developed coffee-ground emesis, so underwent EGD.  LA grade D reflux esophagitis with friability was seen, as well as a medium sized hiatal hernia.  The exam was otherwise normal.  Due to the GI bleed, an IVC filter was placed.  Hypercoagulable evaluation revealed lupus anticoagulant.  He was discharged on Xarelto, as well as pantoprazole.  He followed up with Dr. Mosetta Putt and repeat testing in December 2017 confirmed the presence of lupus anticoagulant.  She therefore recommended he continue on lifelong anticoagulation.  The IVC filter was subsequently  removed in October 2017.  REVIEW OF SYSTEMS:  Review of Systems  Constitutional:  Negative for appetite change, chills, fatigue, fever and unexpected weight change.  HENT:   Negative for lump/mass, mouth sores and sore throat.   Respiratory:  Negative for cough and shortness of breath.   Cardiovascular:  Negative for chest pain and leg swelling.  Gastrointestinal:  Negative for abdominal pain, constipation, diarrhea, nausea and vomiting.  Genitourinary:  Negative for difficulty urinating, dysuria, frequency and hematuria.   Musculoskeletal:  Negative for arthralgias, back pain and myalgias.  Skin:  Negative for itching, rash and wound.  Neurological:  Negative for dizziness, extremity weakness, headaches, light-headedness and numbness.  Hematological:  Negative for adenopathy.  Psychiatric/Behavioral:  Negative for depression and sleep disturbance. The patient is not nervous/anxious.     VITALS:  Blood pressure 129/78, pulse 78, temperature 98 F (36.7 C), temperature source Oral, resp. rate 20, height 5' 9.3" (1.76 m), weight 165 lb 14.4 oz (75.3 kg), SpO2 99 %.  Wt Readings from Last 3 Encounters:  06/05/21 165 lb 14.4 oz (75.3 kg)  02/14/18 165 lb (74.8 kg)  12/31/15 182 lb 14.4 oz (83 kg)    Body mass index is 24.29 kg/m.  Performance status (ECOG): 0 - Asymptomatic  PHYSICAL EXAM:  Physical Exam Vitals and nursing note reviewed.  Constitutional:      General: He is not in acute distress.    Appearance: Normal appearance. He is normal weight.  HENT:     Head: Normocephalic and atraumatic.     Mouth/Throat:  Mouth: Mucous membranes are moist.     Pharynx: Oropharynx is clear. No oropharyngeal exudate or posterior oropharyngeal erythema.  Eyes:     General: No scleral icterus.    Extraocular Movements: Extraocular movements intact.     Conjunctiva/sclera: Conjunctivae normal.     Pupils: Pupils are equal, round, and reactive to light.  Cardiovascular:     Rate and  Rhythm: Normal rate and regular rhythm.     Heart sounds: Normal heart sounds. No murmur heard.   No friction rub. No gallop.  Pulmonary:     Effort: Pulmonary effort is normal.     Breath sounds: Normal breath sounds. No wheezing, rhonchi or rales.  Abdominal:     General: Bowel sounds are normal. There is no distension.     Palpations: Abdomen is soft. There is no hepatomegaly, splenomegaly or mass.     Tenderness: There is no abdominal tenderness.  Musculoskeletal:        General: Normal range of motion.     Cervical back: Normal range of motion and neck supple. No tenderness.     Right lower leg: No edema.     Left lower leg: No edema.  Lymphadenopathy:     Cervical: No cervical adenopathy.     Upper Body:     Right upper body: No supraclavicular or axillary adenopathy.     Left upper body: No supraclavicular or axillary adenopathy.     Lower Body: No right inguinal adenopathy. No left inguinal adenopathy.  Skin:    General: Skin is warm and dry.     Coloration: Skin is not jaundiced.     Findings: No rash.  Neurological:     Mental Status: He is alert and oriented to person, place, and time.     Cranial Nerves: No cranial nerve deficit.  Psychiatric:        Mood and Affect: Mood normal.        Behavior: Behavior normal.        Thought Content: Thought content normal.    LABS:      Latest Ref Rng & Units 06/05/2021   12:00 AM 12/24/2015   11:36 AM 10/29/2015    6:48 AM  CBC  WBC  5.9      8.3     Hemoglobin 13.5 - 17.5 9.9      11.4   12.9    Hematocrit 41 - 53 30      34.3   38.0    Platelets 150 - 400 K/uL 259      236        This result is from an external source.      Latest Ref Rng & Units 06/05/2021   12:00 AM 12/24/2015   11:37 AM 10/29/2015    6:48 AM  CMP  Glucose 70 - 140 mg/dl  99   93    BUN 4 - 21 18      17.9   33    Creatinine 0.6 - 1.3 1.2      1.2   1.30    Sodium 137 - 147 144      142   144    Potassium 3.5 - 5.1 mEq/L 3.8      3.6   4.0     Chloride 99 - 108 110       105    CO2 13 - $Re'22 22      24     'RLD$ Calcium 8.7 -  10.7 9.0      9.1     Total Protein 6.4 - 8.3 g/dL  6.7     Total Bilirubin 0.20 - 1.20 mg/dL  0.35     Alkaline Phos 25 - 125 51      63     AST 14 - 40 44      17     ALT 10 - 40 U/L 31      19        This result is from an external source.     No results found for: CEA1 / No results found for: CEA1 No results found for: PSA1 No results found for: NWG956 No results found for: CAN125  No results found for: TOTALPROTELP, ALBUMINELP, A1GS, A2GS, BETS, BETA2SER, GAMS, MSPIKE, SPEI Lab Results  Component Value Date   TIBC 247 (L) 06/05/2021   FERRITIN 212 06/05/2021   IRONPCTSAT 23 06/05/2021   Lab Results  Component Value Date   LDH 209 (H) 06/05/2021    STUDIES:  No results found.    HISTORY:   Past Medical History:  Diagnosis Date   Crohn's colitis ([place])    history of due to surgery   Decreased range of motion (ROM) of shoulder    "both shoulders; never had a rotator cuff tear; at some point in my life I've sheared both muscles off"   DVT (deep venous thrombosis) ([facility])    Eczema    Fecal urgency    Frequent bowel movements    06-28-13  extreme urgency with bowel movements "3-5" per day or more   GERD (gastroesophageal reflux disease)    History of gout    Hypertension    Iron deficiency anemia    LA (lupus anticoagulant) disorder ([facility])    Macrocytic anemia 06/05/2021   Osteoarthritis    Pneumonia ~ 2007   PONV (postoperative nausea and vomiting)    "with ether"   Renal calculi    Rheumatoid arthritis ([place])    Rotator cuff tear    left - limited motion of arm    Past Surgical History:  Procedure Laterality Date   COLECTOMY     COLONOSCOPY     CYSTOSCOPY  ~ 2013   "clean out of uric acid deposits"   ENTEROSTOMY CLOSURE  11/12/2014   ESOPHAGOGASTRODUODENOSCOPY     ESOPHAGOGASTRODUODENOSCOPY N/A 08/14/2015   Procedure: ESOPHAGOGASTRODUODENOSCOPY (EGD);  Surgeon: Clarene Essex,  MD;  Location: [facility];  Service: Endoscopy;  Laterality: N/A;   GIVENS CAPSULE STUDY N/A 02/14/2018   Procedure: GIVENS CAPSULE STUDY;  Surgeon: Wilford Corner, MD;  Location: [facility];  Service: Endoscopy;  Laterality: N/A;   JOINT REPLACEMENT     LAPAROSCOPIC PARTIAL PROTECTOMY  10/12/2014   with J pouch   PERIPHERAL VASCULAR CATHETERIZATION N/A 08/13/2015   Procedure: IVC Filter Insertion;  Surgeon: Waynetta Sandy, MD;  Location: [facility];  Service: Cardiovascular;  Laterality: N/A;   PERIPHERAL VASCULAR CATHETERIZATION N/A 10/29/2015   Procedure: IVC Filter Removal;  Surgeon: Waynetta Sandy, MD;  Location: [facility];  Service: Cardiovascular;  Laterality: N/A;   PERIPHERAL VASCULAR CATHETERIZATION N/A 10/29/2015   Procedure: IVC/SVC Venography;  Surgeon: Waynetta Sandy, MD;  Location: [facility];  Service: Cardiovascular;  Laterality: N/A;   REVERSE SHOULDER ARTHROPLASTY Left 05/15/2015   REVERSE SHOULDER ARTHROPLASTY Left 05/15/2015   Procedure: LEFT REVERSE SHOULDER ARTHROPLASTY;  Surgeon: Justice Britain, MD;  Location: [facility];  Service: Orthopedics;  Laterality:  Left;   THYROID SURGERY  1962; 1964; 1968   (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 He has never used smokeless tobacco. He reports current alcohol use. He reports that he does not use drugs.The patient is alone today.  Allergies:  Allergies   Allergen Reactions   Infliximab Other (See Comments)   Plaquenil [Hydroxychloroquine]     Current Medications: Current Outpatient Medications  Medication Sig Dispense Refill   golimumab (SIMPONI ARIA) 50 MG/4ML SOLN injection See admin instructions.     acetaminophen (TYLENOL) 650 MG CR tablet Take 1,300 mg by mouth every 8 (eight) hours as needed for pain.     allopurinol (ZYLOPRIM) 100 MG tablet Take 100 mg by mouth 2 (two) times daily.      Ascorbic Acid (VITAMIN C) 1000 MG tablet Take 1,000 mg by mouth daily.     clobetasol cream (TEMOVATE) 0.05 % Apply topically.     doxazosin (CARDURA) 4 MG tablet Take 4 mg by mouth every evening.     Emollient (GOLD BOND MEDICATED BODY EX) Apply 1 application topically 2 (two) times daily as needed (rash).     ferrous sulfate 325 (65 FE) MG tablet Take 325 mg by mouth 2 (two) times daily with a meal.     halobetasol (ULTRAVATE) 0.05 % ointment      hydrOXYzine (VISTARIL) 25 MG capsule Take 2 capsules by mouth at bedtime.  2   levocetirizine (XYZAL) 5 MG tablet Take by mouth.     lisinopril (ZESTRIL) 40 MG tablet Take 40 mg by mouth daily.     loperamide (IMODIUM A-D) 2 MG tablet 1-2 tablet     magnesium gluconate (MAGONATE) 500 MG tablet Take 500 mg by mouth daily.     mineral oil-hydrophilic petrolatum (AQUAPHOR) ointment See admin instructions.     Multiple Vitamins-Minerals (CENTRUM ADULTS) TABS      pantoprazole (PROTONIX) 40 MG tablet Take 1 tablet (40 mg total) by mouth 2 (two) times daily before a meal. (Patient taking differently: Take 80 mg by mouth 2 (two) times daily before a meal. ) 60 tablet 0   rOPINIRole (REQUIP) 0.5 MG tablet Take by mouth.     traMADol (ULTRAM) 50 MG tablet Take 100 mg by mouth 2 (two) times daily.     triamcinolone ointment (KENALOG) 0.1 % SMARTSIG:Topical 1-2 Times Daily PRN     XARELTO 20 MG TABS tablet Take 20 mg by mouth daily.  0   zolpidem (AMBIEN) 10 MG tablet Take 10 mg by mouth at bedtime.      No  current facility-administered medications for this visit.   Facility-Administered Medications Ordered in Other Visits  Medication Dose Route Frequency Provider Last Rate Last Admin   0.9 %  sodium chloride infusion   Intravenous Continuous Wilford Corner, MD         ASSESSMENT & PLAN:   Assessment/Plan:  LINC RENNE is a 71  y.o. male macrocytic anemia of uncertain etiology.  No nutritional deficiency was found today.  His LDH was mildly elevated, so haptoglobin was added and was normal, so no evidence of hemolysis.  Serum protein electrophoresis is pending.  The anemia may represent anemia of chronic disease possibly myelodysplasia.  Bone marrow biopsy may be required.  We will plan to see the patient back in 2 weeks to discuss the results of his testing and further recommendations.   I discussed the assessment and plan with the patient.  He was provided an opportunity to ask questions and all were answered.  The patient agreed with the plan and demonstrated an understanding of the instructions.  He was advised to call back if had symptoms of worsening anemia.  Thank you for the opportunity to care for this gentleman.   I provided 45 minutes of face-to-face time during this encounter and > 50% was spent counseling as documented under my assessment and plan.    Marvia Pickles, PA-C

## 2021-06-08 LAB — HAPTOGLOBIN: Haptoglobin: 189 mg/dL (ref 32–363)

## 2021-06-09 LAB — PROTEIN ELECTROPHORESIS, SERUM, WITH REFLEX
A/G Ratio: 1.2 (ref 0.7–1.7)
Albumin ELP: 3.7 g/dL (ref 2.9–4.4)
Alpha-1-Globulin: 0.3 g/dL (ref 0.0–0.4)
Alpha-2-Globulin: 0.7 g/dL (ref 0.4–1.0)
Beta Globulin: 1 g/dL (ref 0.7–1.3)
Gamma Globulin: 1.2 g/dL (ref 0.4–1.8)
Globulin, Total: 3.1 g/dL (ref 2.2–3.9)
Total Protein ELP: 6.8 g/dL (ref 6.0–8.5)

## 2021-06-18 ENCOUNTER — Other Ambulatory Visit: Payer: Self-pay | Admitting: Oncology

## 2021-06-18 DIAGNOSIS — E872 Acidosis, unspecified: Secondary | ICD-10-CM

## 2021-06-18 DIAGNOSIS — D539 Nutritional anemia, unspecified: Secondary | ICD-10-CM

## 2021-06-18 NOTE — Progress Notes (Signed)
[name]  [address] [place],  [place]  [address] [phone]  Clinic Day:  06/19/2021  Referring physician: Philmore Pali, NP  HISTORY OF PRESENT ILLNESS:  Frank Dodson is a 71 y.o. male who our office recently began seeing for macrocytic anemia.  He comes in today to go over all of his recent labs to determine the etiology behind this.  Despite his anemia, he denies having increased fatigue or any overt forms of blood loss.    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  He developed bilateral pulmonary emboli in July 2017.  He was hospitalized and received IV heparin.  He then developed coffee-ground emesis, so underwent EGD.  LA grade D reflux esophagitis with friability was seen, as well as a medium sized hiatal hernia.  The exam was otherwise normal.  Due to the GI bleed, an IVC filter was placed.  Hypercoagulable evaluation revealed lupus anticoagulant.  He was discharged on Xarelto, as well as pantoprazole.  He followed up with Dr. Burr Medico and repeat testing in December 2017 confirmed the presence of lupus anticoagulant.  She therefore recommended he continue on lifelong anticoagulation.  The IVC filter was subsequently removed in October 2017.  VITALS:  Blood pressure 136/81, pulse 73, temperature 98.1 F (36.7 C), resp. rate 16, height 5' 9.3" (1.76 m), weight 164 lb 3.2 oz (74.5 kg), SpO2 99 %.  Wt Readings from Last 3 Encounters:  06/19/21 164 lb 3.2 oz (74.5 kg)  06/05/21 165 lb 14.4 oz (75.3 kg)  02/14/18 165 lb (74.8 kg)    Body mass index is 24.04 kg/m.  Performance status (ECOG): 0 - Asymptomatic  PHYSICAL EXAM:  Physical Exam Vitals and nursing note reviewed.  Constitutional:      General: He is not in acute distress.    Appearance: Normal appearance. He is normal weight.  HENT:     Head: Normocephalic and  atraumatic.     Mouth/Throat:     Mouth: Mucous membranes are moist.     Pharynx: Oropharynx is clear. No oropharyngeal exudate or posterior oropharyngeal erythema.  Eyes:     General: No scleral icterus.    Extraocular Movements: Extraocular movements intact.     Conjunctiva/sclera: Conjunctivae normal.     Pupils: Pupils are equal, round, and reactive to light.  Cardiovascular:     Rate and Rhythm: Normal rate and regular rhythm.     Heart sounds: Normal heart sounds. No murmur heard.    No friction rub. No gallop.  Pulmonary:     Effort: Pulmonary effort is normal.     Breath sounds: Normal breath sounds. No wheezing, rhonchi or rales.  Abdominal:     General: Bowel sounds are normal. There is no distension.     Palpations: Abdomen is soft. There is no hepatomegaly, splenomegaly or mass.     Tenderness: There is no abdominal tenderness.  Musculoskeletal:        General: Normal range of motion.     Cervical back: Normal range of motion and neck supple. No tenderness.     Right lower leg: No edema.     Left lower leg: No edema.  Lymphadenopathy:     Cervical: No cervical adenopathy.     Upper Body:     Right upper body: No supraclavicular or axillary adenopathy.  Left upper body: No supraclavicular or axillary adenopathy.     Lower Body: No right inguinal adenopathy. No left inguinal adenopathy.  Skin:    General: Skin is warm and dry.     Coloration: Skin is not jaundiced.     Findings: No rash.  Neurological:     Mental Status: He is alert and oriented to person, place, and time.     Cranial Nerves: No cranial nerve deficit.  Psychiatric:        Mood and Affect: Mood normal.        Behavior: Behavior normal.        Thought Content: Thought content normal.    LABS:      Latest Ref Rng & Units 06/19/2021   12:00 AM 06/05/2021   12:00 AM 12/24/2015   11:36 AM  CBC  WBC  6.7     5.9     8.3   Hemoglobin 13.5 - 17.5 10.0     9.9     11.4   Hematocrit 41 - 53 30      30     34.3   Platelets 150 - 400 K/uL 250     259     236      This result is from an external source.      Latest Ref Rng & Units 06/05/2021   12:00 AM 12/24/2015   11:37 AM 10/29/2015    6:48 AM  CMP  Glucose 70 - 140 mg/dl  99  93   BUN 4 - 21 18     17.9  33   Creatinine 0.6 - 1.3 1.2     1.2  1.30   Sodium 137 - 147 144     142  144   Potassium 3.5 - 5.1 mEq/L 3.8     3.6  4.0   Chloride 99 - 108 110      105   CO2 13 - '22 22     24    '$ Calcium 8.7 - 10.7 9.0     9.1    Total Protein 6.4 - 8.3 g/dL  6.7    Total Bilirubin 0.20 - 1.20 mg/dL  0.35    Alkaline Phos 25 - 125 51     63    AST 14 - 40 44     17    ALT 10 - 40 U/L 31     19       This result is from an external source.    Latest Reference Range & Units 06/05/21 10:30  LDH 98 - 192 U/L 209 (H)  (H): Data is abnormally high  Latest Reference Range & Units 06/05/21 10:21  Iron 45 - 182 ug/dL 56  UIBC ug/dL 191  TIBC 250 - 450 ug/dL 247 (L)  Saturation Ratios 17.9 - 39.5 % 23  Ferritin 24 - 336 ng/mL 212  Folate >5.9 ng/mL 36.4  Vitamin B12 180 - 914 pg/mL 558  (L): Data is abnormally low  Latest Reference Range & Units 06/05/21 10:21  Total Protein ELP 6.0 - 8.5 g/dL 6.8  Albumin ELP 2.9 - 4.4 g/dL 3.7  SPEP Interpretation  Comment  Globulin, Total 2.2 - 3.9 g/dL 3.1 (C)  A/G Ratio 0.7 - 1.7  1.2 (C)  Alpha-1-Globulin 0.0 - 0.4 g/dL 0.3  Alpha-2-Globulin 0.4 - 1.0 g/dL 0.7  Beta Globulin 0.7 - 1.3 g/dL 1.0  Gamma Globulin 0.4 - 1.8 g/dL 1.2  M-SPIKE, % Not Observed  g/dL Not Observed  Comment  Comment (C)  (C): Corrected  ASSESSMENT & PLAN:   Assessment/Plan:  Frank Dodson is a 71 y.o. male macrocytic anemia of uncertain etiology.  No nutritional deficiency was found today.  His LDH was mildly elevated, so haptoglobin was added and was normal, so no evidence of hemolysis.  Serum protein electrophoresis is pending.  The anemia may represent anemia of chronic disease possibly myelodysplasia.  Bone marrow  biopsy may be required.  We will plan to see the patient back in 2 weeks to discuss the results of his testing and further recommendations.   I discussed the assessment and plan with the patient.  He was provided an opportunity to ask questions and all were answered.  The patient agreed with the plan and demonstrated an understanding of the instructions.  He was advised to call back if had symptoms of worsening anemia.  Thank you for the opportunity to care for this gentleman.   I provided 45 minutes of face-to-face time during this encounter and > 50% was spent counseling as documented under my assessment and plan.    Placido Hangartner Macarthur Critchley, MD

## 2021-06-19 ENCOUNTER — Other Ambulatory Visit: Payer: Self-pay | Admitting: Oncology

## 2021-06-19 ENCOUNTER — Telehealth: Payer: Self-pay | Admitting: Oncology

## 2021-06-19 ENCOUNTER — Other Ambulatory Visit: Payer: Self-pay

## 2021-06-19 ENCOUNTER — Other Ambulatory Visit: Payer: Self-pay | Admitting: Hematology and Oncology

## 2021-06-19 ENCOUNTER — Inpatient Hospital Stay: Payer: Medicare Other | Attending: Hematology and Oncology | Admitting: Oncology

## 2021-06-19 ENCOUNTER — Inpatient Hospital Stay: Payer: Medicare Other

## 2021-06-19 VITALS — BP 136/81 | HR 73 | Temp 98.1°F | Resp 16 | Ht 69.3 in | Wt 164.2 lb

## 2021-06-19 DIAGNOSIS — D539 Nutritional anemia, unspecified: Secondary | ICD-10-CM

## 2021-06-19 DIAGNOSIS — D62 Acute posthemorrhagic anemia: Secondary | ICD-10-CM | POA: Diagnosis not present

## 2021-06-19 LAB — CBC AND DIFFERENTIAL
HCT: 30 — AB (ref 41–53)
Hemoglobin: 10 — AB (ref 13.5–17.5)
Neutrophils Absolute: 3.42
Platelets: 250 10*3/uL (ref 150–400)
WBC: 6.7

## 2021-06-19 LAB — CBC
MCV: 102 — AB (ref 80–94)
RBC: 2.94 — AB (ref 3.87–5.11)

## 2021-06-19 NOTE — Telephone Encounter (Signed)
Per 06/19/21 los next appt scheduled and confirmed with patient 

## 2021-06-19 NOTE — Progress Notes (Signed)
[name]  [address] [place],  [place]  [address] [phone]  Clinic Day:  06/19/2021  Referring physician: Philmore Pali, NP  HISTORY OF PRESENT ILLNESS:  Frank Dodson is a 71 y.o. male who our office recently began seeing for macrocytic anemia.  He comes in today to go over all of his recent labs to determine the etiology behind this.  Despite his anemia, he denies having increased fatigue or any overt forms of blood loss.    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  He developed bilateral pulmonary emboli in July 2017.  He was hospitalized and received IV heparin.  He then developed coffee-ground emesis, so underwent EGD.  LA grade D reflux esophagitis with friability was seen, as well as a medium sized hiatal hernia.  The exam was otherwise normal.  Due to the GI bleed, an IVC filter was placed.  Hypercoagulable evaluation revealed lupus anticoagulant.  He was discharged on Xarelto, as well as pantoprazole.  He followed up with Dr. Burr Medico and repeat testing in December 2017 confirmed the presence of lupus anticoagulant.  She therefore recommended he continue on lifelong anticoagulation.  The IVC filter was subsequently removed in October 2017.  VITALS:  Blood pressure 136/81, pulse 73, temperature 98.1 F (36.7 C), resp. rate 16, height 5' 9.3" (1.76 m), weight 164 lb 3.2 oz (74.5 kg), SpO2 99 %.  Wt Readings from Last 3 Encounters:  06/19/21 164 lb 3.2 oz (74.5 kg)  06/05/21 165 lb 14.4 oz (75.3 kg)  02/14/18 165 lb (74.8 kg)    Body mass index is 24.04 kg/m.  Performance status (ECOG): 0 - Asymptomatic  PHYSICAL EXAM:  Physical Exam Vitals and nursing note reviewed.  Constitutional:      General: He is not in acute distress.    Appearance: Normal appearance. He is normal weight.  HENT:     Head: Normocephalic and  atraumatic.     Mouth/Throat:     Mouth: Mucous membranes are moist.     Pharynx: Oropharynx is clear. No oropharyngeal exudate or posterior oropharyngeal erythema.  Eyes:     General: No scleral icterus.    Extraocular Movements: Extraocular movements intact.     Conjunctiva/sclera: Conjunctivae normal.     Pupils: Pupils are equal, round, and reactive to light.  Cardiovascular:     Rate and Rhythm: Normal rate and regular rhythm.     Heart sounds: Normal heart sounds. No murmur heard.    No friction rub. No gallop.  Pulmonary:     Effort: Pulmonary effort is normal.     Breath sounds: Normal breath sounds. No wheezing, rhonchi or rales.  Abdominal:     General: Bowel sounds are normal. There is no distension.     Palpations: Abdomen is soft. There is no hepatomegaly, splenomegaly or mass.     Tenderness: There is no abdominal tenderness.  Musculoskeletal:        General: Normal range of motion.     Cervical back: Normal range of motion and neck supple. No tenderness.     Right lower leg: No edema.     Left lower leg: No edema.  Lymphadenopathy:     Cervical: No cervical adenopathy.     Upper Body:     Right upper body: No supraclavicular or axillary adenopathy.  Left upper body: No supraclavicular or axillary adenopathy.     Lower Body: No right inguinal adenopathy. No left inguinal adenopathy.  Skin:    General: Skin is warm and dry.     Coloration: Skin is not jaundiced.     Findings: No rash.  Neurological:     Mental Status: He is alert and oriented to person, place, and time.     Cranial Nerves: No cranial nerve deficit.  Psychiatric:        Mood and Affect: Mood normal.        Behavior: Behavior normal.        Thought Content: Thought content normal.    LABS:      Latest Ref Rng & Units 06/19/2021   12:00 AM 06/05/2021   12:00 AM 12/24/2015   11:36 AM  CBC  WBC  6.7     5.9     8.3   Hemoglobin 13.5 - 17.5 10.0     9.9     11.4   Hematocrit 41 - 53 30      30     34.3   Platelets 150 - 400 K/uL 250     259     236      This result is from an external source.       Latest Ref Rng & Units 06/05/2021   12:00 AM 12/24/2015   11:37 AM 10/29/2015    6:48 AM  CMP  Glucose 70 - 140 mg/dl  99  93   BUN 4 - 21 18     17.9  33   Creatinine 0.6 - 1.3 1.2     1.2  1.30   Sodium 137 - 147 144     142  144   Potassium 3.5 - 5.1 mEq/L 3.8     3.6  4.0   Chloride 99 - 108 110      105   CO2 13 - $Re'22 22     24    'UyD$ Calcium 8.7 - 10.7 9.0     9.1    Total Protein 6.4 - 8.3 g/dL  6.7    Total Bilirubin 0.20 - 1.20 mg/dL  0.35    Alkaline Phos 25 - 125 51     63    AST 14 - 40 44     17    ALT 10 - 40 U/L 31     19       This result is from an external source.     Latest Reference Range & Units 06/05/21 10:30  LDH 98 - 192 U/L 209 (H)  (H): Data is abnormally high  Latest Reference Range & Units 06/05/21 10:21  Iron 45 - 182 ug/dL 56  UIBC ug/dL 191  TIBC 250 - 450 ug/dL 247 (L)  Saturation Ratios 17.9 - 39.5 % 23  Ferritin 24 - 336 ng/mL 212  Folate >5.9 ng/mL 36.4  Vitamin B12 180 - 914 pg/mL 558  (L): Data is abnormally low  Latest Reference Range & Units 06/05/21 10:21  Total Protein ELP 6.0 - 8.5 g/dL 6.8  Albumin ELP 2.9 - 4.4 g/dL 3.7  SPEP Interpretation  Comment  Globulin, Total 2.2 - 3.9 g/dL 3.1 (C)  A/G Ratio 0.7 - 1.7  1.2 (C)  Alpha-1-Globulin 0.0 - 0.4 g/dL 0.3  Alpha-2-Globulin 0.4 - 1.0 g/dL 0.7  Beta Globulin 0.7 - 1.3 g/dL 1.0  Gamma Globulin 0.4 - 1.8 g/dL 1.2  M-SPIKE, %  Not Observed g/dL Not Observed  Comment  Comment (C)  (C): Corrected  ASSESSMENT & PLAN:  Assessment/Plan:  SHLOIMA CLINCH is a 71 y.o. male with macrocytic anemia of uncertain etiology.  When evaluating all of his recent labs, he does not have any nutritional deficiencies factoring into his macrocytic anemia.  Furthermore, his serum protein electrophoresis came back normal, essentially ruling out an underlying plasma cell dyscrasia being behind his  macrocytic anemia.  Although his LDH was mildly elevated, this gentleman's haptoglobin was normal.  This essentially rules out hemolytic anemia being behind his macrocytic anemia.  His TSH was also normal, which rules out severe thyroid disease factoring into his macrocytic anemia.  His hemoglobin of 10 today is low.  However, he is clinically doing very well.  Moving forward, this gentleman will be followed conservatively.  I will see him back in 3 months for repeat clinical assessment.  If future labs show that his hemoglobin has fallen below 10, the patient understands a bone marrow biopsy would be done to rule out any type of intrinsic marrow disorder.  The patient understands all the plans discussed today and is in agreement with them.  Adan Beal Macarthur Critchley, MD

## 2021-07-15 DIAGNOSIS — G894 Chronic pain syndrome: Secondary | ICD-10-CM | POA: Diagnosis not present

## 2021-07-15 DIAGNOSIS — Z79899 Other long term (current) drug therapy: Secondary | ICD-10-CM | POA: Diagnosis not present

## 2021-07-15 DIAGNOSIS — M47819 Spondylosis without myelopathy or radiculopathy, site unspecified: Secondary | ICD-10-CM | POA: Diagnosis not present

## 2021-07-16 DIAGNOSIS — M1009 Idiopathic gout, multiple sites: Secondary | ICD-10-CM | POA: Diagnosis not present

## 2021-07-16 DIAGNOSIS — Z6824 Body mass index (BMI) 24.0-24.9, adult: Secondary | ICD-10-CM | POA: Diagnosis not present

## 2021-07-16 DIAGNOSIS — K5080 Crohn's disease of both small and large intestine without complications: Secondary | ICD-10-CM | POA: Diagnosis not present

## 2021-07-16 DIAGNOSIS — R21 Rash and other nonspecific skin eruption: Secondary | ICD-10-CM | POA: Diagnosis not present

## 2021-07-16 DIAGNOSIS — M0589 Other rheumatoid arthritis with rheumatoid factor of multiple sites: Secondary | ICD-10-CM | POA: Diagnosis not present

## 2021-07-16 DIAGNOSIS — M2550 Pain in unspecified joint: Secondary | ICD-10-CM | POA: Diagnosis not present

## 2021-07-16 DIAGNOSIS — Z79899 Other long term (current) drug therapy: Secondary | ICD-10-CM | POA: Diagnosis not present

## 2021-07-21 DIAGNOSIS — L309 Dermatitis, unspecified: Secondary | ICD-10-CM | POA: Diagnosis not present

## 2021-07-21 DIAGNOSIS — L308 Other specified dermatitis: Secondary | ICD-10-CM | POA: Diagnosis not present

## 2021-07-21 DIAGNOSIS — D485 Neoplasm of uncertain behavior of skin: Secondary | ICD-10-CM | POA: Diagnosis not present

## 2021-07-29 DIAGNOSIS — L239 Allergic contact dermatitis, unspecified cause: Secondary | ICD-10-CM | POA: Diagnosis not present

## 2021-08-03 DIAGNOSIS — L259 Unspecified contact dermatitis, unspecified cause: Secondary | ICD-10-CM | POA: Diagnosis not present

## 2021-08-03 DIAGNOSIS — L989 Disorder of the skin and subcutaneous tissue, unspecified: Secondary | ICD-10-CM | POA: Diagnosis not present

## 2021-08-03 DIAGNOSIS — Z79899 Other long term (current) drug therapy: Secondary | ICD-10-CM | POA: Diagnosis not present

## 2021-08-03 DIAGNOSIS — L209 Atopic dermatitis, unspecified: Secondary | ICD-10-CM | POA: Diagnosis not present

## 2021-08-04 DIAGNOSIS — M0589 Other rheumatoid arthritis with rheumatoid factor of multiple sites: Secondary | ICD-10-CM | POA: Diagnosis not present

## 2021-08-18 DIAGNOSIS — L209 Atopic dermatitis, unspecified: Secondary | ICD-10-CM | POA: Diagnosis not present

## 2021-08-19 DIAGNOSIS — D649 Anemia, unspecified: Secondary | ICD-10-CM | POA: Diagnosis not present

## 2021-08-19 DIAGNOSIS — R944 Abnormal results of kidney function studies: Secondary | ICD-10-CM | POA: Diagnosis not present

## 2021-09-01 DIAGNOSIS — D649 Anemia, unspecified: Secondary | ICD-10-CM | POA: Diagnosis not present

## 2021-09-03 DIAGNOSIS — L209 Atopic dermatitis, unspecified: Secondary | ICD-10-CM | POA: Diagnosis not present

## 2021-09-08 DIAGNOSIS — D649 Anemia, unspecified: Secondary | ICD-10-CM | POA: Diagnosis not present

## 2021-09-08 DIAGNOSIS — Z1212 Encounter for screening for malignant neoplasm of rectum: Secondary | ICD-10-CM | POA: Diagnosis not present

## 2021-09-17 NOTE — Progress Notes (Signed)
[facility]  [address] [place],  [place]  [address] [phone]  Clinic Day:  09/18/2021  Referring physician: Philmore Pali, NP  HISTORY OF PRESENT ILLNESS:  Frank Dodson is a 71 y.o. male who our office recently began seeing for macrocytic anemia.  Extensive labs done at his initial work-up did not reveal any obvious etiology behind his anemia.  He comes in today for routine follow-up.  Since his last visit, the patient has been doing well.  He denies having increased fatigue or any overt forms of blood loss which concern him for progressive anemia.  Of note, this gentleman also has a history of deep venous thrombosis of the left lower extremity in 2007 after left knee injury, but states he was not treated with anticoagulation.  He developed bilateral pulmonary emboli in July 2017.  He was hospitalized and received IV heparin.  He then developed coffee-ground emesis, so underwent EGD.  LA grade D reflux esophagitis with friability was seen, as well as a medium sized hiatal hernia.  The exam was otherwise normal.  Due to the GI bleed, an IVC filter was placed.  A hypercoagulable evaluation revealed lupus anticoagulant.  He was discharged on Xarelto, as well as pantoprazole.  He followed up with Dr. Burr Medico and repeat testing in December 2017 confirmed the presence of a lupus anticoagulant.  She therefore recommended he continue on lifelong anticoagulation.  The IVC filter was subsequently removed in October 2017.  VITALS:  Blood pressure 139/73, pulse 78, temperature 97.9 F (36.6 C), resp. rate 18, height 5' 9.3" (1.76 m), weight 156 lb 1.6 oz (70.8 kg), SpO2 98 %.  Wt Readings from Last 3 Encounters:  09/18/21 156 lb 1.6 oz (70.8 kg)  06/19/21 164 lb 3.2 oz (74.5 kg)  06/05/21 165 lb 14.4 oz (75.3 kg)    Body mass index is 22.85 kg/m.  Performance status (ECOG): 0 - Asymptomatic  PHYSICAL EXAM:  Physical Exam Vitals and nursing note reviewed.   Constitutional:      General: He is not in acute distress.    Appearance: Normal appearance. He is normal weight.  HENT:     Head: Normocephalic and atraumatic.     Mouth/Throat:     Mouth: Mucous membranes are moist.     Pharynx: Oropharynx is clear. No oropharyngeal exudate or posterior oropharyngeal erythema.  Eyes:     General: No scleral icterus.    Extraocular Movements: Extraocular movements intact.     Conjunctiva/sclera: Conjunctivae normal.     Pupils: Pupils are equal, round, and reactive to light.  Cardiovascular:     Rate and Rhythm: Normal rate and regular rhythm.     Heart sounds: Normal heart sounds. No murmur heard.    No friction rub. No gallop.  Pulmonary:     Effort: Pulmonary effort is normal.     Breath sounds: Normal breath sounds. No wheezing, rhonchi or rales.  Abdominal:     General: Bowel sounds are normal. There is no distension.     Palpations: Abdomen is soft. There is no hepatomegaly, splenomegaly or mass.     Tenderness: There is no abdominal tenderness.  Musculoskeletal:        General: Normal range of motion.     Cervical back: Normal range of motion and neck supple. No tenderness.     Right lower leg: No edema.     Left lower leg: No edema.  Lymphadenopathy:     Cervical:  No cervical adenopathy.     Upper Body:     Right upper body: No supraclavicular or axillary adenopathy.     Left upper body: No supraclavicular or axillary adenopathy.     Lower Body: No right inguinal adenopathy. No left inguinal adenopathy.  Skin:    General: Skin is warm and dry.     Coloration: Skin is not jaundiced.     Findings: No rash.  Neurological:     Mental Status: He is alert and oriented to person, place, and time.     Cranial Nerves: No cranial nerve deficit.  Psychiatric:        Mood and Affect: Mood normal.        Behavior: Behavior normal.        Thought Content: Thought content normal.   LABS:      Latest Ref Rng & Units 09/18/2021   12:00 AM  06/19/2021   12:00 AM 06/05/2021   12:00 AM  CBC  WBC  5.5     6.7     5.9      Hemoglobin 13.5 - 17.5 10.0     10.0     9.9      Hematocrit 41 - 53 '30     30     30      '$ Platelets 150 - 400 K/uL 256     250     259         This result is from an external source.      Latest Ref Rng & Units 09/18/2021   12:00 AM 06/05/2021   12:00 AM 12/24/2015   11:37 AM  CMP  Glucose 70 - 140 mg/dl   99   BUN 4 - '21 28     18     '$ 17.9   Creatinine 0.6 - 1.3 1.3     1.2     1.2   Sodium 137 - 147 141     144     142   Potassium 3.5 - 5.1 mEq/L 4.2     3.8     3.6   Chloride 99 - 108 110     110       CO2 13 - '22 22     22     24   '$ Calcium 8.7 - 10.7 9.4     9.0     9.1   Total Protein 6.4 - 8.3 g/dL   6.7   Total Bilirubin 0.20 - 1.20 mg/dL   0.35   Alkaline Phos 25 - 125 43     51     63   AST 14 - 40 27     44     17   ALT 10 - 40 U/L '18     31     19      '$ This result is from an external source.    ASSESSMENT & PLAN:  Assessment/Plan:  A 71 y.o. male with macrocytic anemia of uncertain etiology.  This gentleman's hemoglobin of 10 today is stable.  Furthermore, he claims that he is doing well.  As long as his hemoglobin stays at this level, his anemia will continue to be followed conservatively.  I will see him back in 4 months for repeat clinical assessment.  The patient understands all the plans discussed today and is in agreement with them.  Bernardette Waldron Macarthur Critchley, MD

## 2021-09-18 ENCOUNTER — Inpatient Hospital Stay: Payer: Medicare Other | Attending: Hematology and Oncology | Admitting: Oncology

## 2021-09-18 ENCOUNTER — Inpatient Hospital Stay: Payer: Medicare Other

## 2021-09-18 ENCOUNTER — Other Ambulatory Visit: Payer: Self-pay | Admitting: Oncology

## 2021-09-18 VITALS — BP 139/73 | HR 78 | Temp 97.9°F | Resp 18 | Ht 69.3 in | Wt 156.1 lb

## 2021-09-18 DIAGNOSIS — D539 Nutritional anemia, unspecified: Secondary | ICD-10-CM

## 2021-09-18 DIAGNOSIS — D649 Anemia, unspecified: Secondary | ICD-10-CM | POA: Diagnosis not present

## 2021-09-18 LAB — BASIC METABOLIC PANEL
BUN: 28 — AB (ref 4–21)
CO2: 22 (ref 13–22)
Chloride: 110 — AB (ref 99–108)
Creatinine: 1.3 (ref 0.6–1.3)
Glucose: 99
Potassium: 4.2 mEq/L (ref 3.5–5.1)
Sodium: 141 (ref 137–147)

## 2021-09-18 LAB — HEPATIC FUNCTION PANEL
ALT: 18 U/L (ref 10–40)
AST: 27 (ref 14–40)
Alkaline Phosphatase: 43 (ref 25–125)
Bilirubin, Total: 0.4

## 2021-09-18 LAB — COMPREHENSIVE METABOLIC PANEL
Albumin: 4.1 (ref 3.5–5.0)
Calcium: 9.4 (ref 8.7–10.7)

## 2021-09-18 LAB — CBC AND DIFFERENTIAL
HCT: 30 — AB (ref 41–53)
Hemoglobin: 10 — AB (ref 13.5–17.5)
Neutrophils Absolute: 3.08
Platelets: 256 10*3/uL (ref 150–400)
WBC: 5.5

## 2021-09-18 LAB — CBC: RBC: 3.06 — AB (ref 3.87–5.11)

## 2021-09-29 DIAGNOSIS — M0589 Other rheumatoid arthritis with rheumatoid factor of multiple sites: Secondary | ICD-10-CM | POA: Diagnosis not present

## 2021-10-13 DIAGNOSIS — M069 Rheumatoid arthritis, unspecified: Secondary | ICD-10-CM | POA: Diagnosis not present

## 2021-10-13 DIAGNOSIS — G894 Chronic pain syndrome: Secondary | ICD-10-CM | POA: Diagnosis not present

## 2021-10-13 DIAGNOSIS — M47819 Spondylosis without myelopathy or radiculopathy, site unspecified: Secondary | ICD-10-CM | POA: Diagnosis not present

## 2021-10-22 DIAGNOSIS — Z23 Encounter for immunization: Secondary | ICD-10-CM | POA: Diagnosis not present

## 2021-11-04 DIAGNOSIS — Z79899 Other long term (current) drug therapy: Secondary | ICD-10-CM | POA: Diagnosis not present

## 2021-11-04 DIAGNOSIS — L209 Atopic dermatitis, unspecified: Secondary | ICD-10-CM | POA: Diagnosis not present

## 2021-11-24 DIAGNOSIS — K219 Gastro-esophageal reflux disease without esophagitis: Secondary | ICD-10-CM | POA: Diagnosis not present

## 2021-11-24 DIAGNOSIS — Z9181 History of falling: Secondary | ICD-10-CM | POA: Diagnosis not present

## 2021-11-24 DIAGNOSIS — I10 Essential (primary) hypertension: Secondary | ICD-10-CM | POA: Diagnosis not present

## 2021-11-24 DIAGNOSIS — Z1331 Encounter for screening for depression: Secondary | ICD-10-CM | POA: Diagnosis not present

## 2021-11-24 DIAGNOSIS — D649 Anemia, unspecified: Secondary | ICD-10-CM | POA: Diagnosis not present

## 2021-11-24 DIAGNOSIS — G4700 Insomnia, unspecified: Secondary | ICD-10-CM | POA: Diagnosis not present

## 2021-11-25 DIAGNOSIS — K50818 Crohn's disease of both small and large intestine with other complication: Secondary | ICD-10-CM | POA: Diagnosis not present

## 2021-11-25 DIAGNOSIS — M0589 Other rheumatoid arthritis with rheumatoid factor of multiple sites: Secondary | ICD-10-CM | POA: Diagnosis not present

## 2022-01-06 DIAGNOSIS — M47819 Spondylosis without myelopathy or radiculopathy, site unspecified: Secondary | ICD-10-CM | POA: Diagnosis not present

## 2022-01-06 DIAGNOSIS — G894 Chronic pain syndrome: Secondary | ICD-10-CM | POA: Diagnosis not present

## 2022-01-06 DIAGNOSIS — M069 Rheumatoid arthritis, unspecified: Secondary | ICD-10-CM | POA: Diagnosis not present

## 2022-01-15 ENCOUNTER — Inpatient Hospital Stay: Payer: Medicare Other | Attending: Oncology

## 2022-01-15 DIAGNOSIS — D539 Nutritional anemia, unspecified: Secondary | ICD-10-CM | POA: Diagnosis not present

## 2022-01-15 LAB — CBC WITH DIFFERENTIAL (CANCER CENTER ONLY)
Abs Immature Granulocytes: 0.01 10*3/uL (ref 0.00–0.07)
Basophils Absolute: 0 10*3/uL (ref 0.0–0.1)
Basophils Relative: 1 %
Eosinophils Absolute: 0.2 10*3/uL (ref 0.0–0.5)
Eosinophils Relative: 2 %
HCT: 30.4 % — ABNORMAL LOW (ref 39.0–52.0)
Hemoglobin: 9.8 g/dL — ABNORMAL LOW (ref 13.0–17.0)
Immature Granulocytes: 0 %
Lymphocytes Relative: 32 %
Lymphs Abs: 2.2 10*3/uL (ref 0.7–4.0)
MCH: 32.9 pg (ref 26.0–34.0)
MCHC: 32.2 g/dL (ref 30.0–36.0)
MCV: 102 fL — ABNORMAL HIGH (ref 80.0–100.0)
Monocytes Absolute: 0.8 10*3/uL (ref 0.1–1.0)
Monocytes Relative: 11 %
Neutro Abs: 3.8 10*3/uL (ref 1.7–7.7)
Neutrophils Relative %: 54 %
Platelet Count: 221 10*3/uL (ref 150–400)
RBC: 2.98 MIL/uL — ABNORMAL LOW (ref 4.22–5.81)
RDW: 14.2 % (ref 11.5–15.5)
WBC Count: 7 10*3/uL (ref 4.0–10.5)
nRBC: 0 % (ref 0.0–0.2)

## 2022-01-15 LAB — CMP (CANCER CENTER ONLY)
ALT: 17 U/L (ref 0–44)
AST: 30 U/L (ref 15–41)
Albumin: 4.1 g/dL (ref 3.5–5.0)
Alkaline Phosphatase: 37 U/L — ABNORMAL LOW (ref 38–126)
Anion gap: 8 (ref 5–15)
BUN: 26 mg/dL — ABNORMAL HIGH (ref 8–23)
CO2: 23 mmol/L (ref 22–32)
Calcium: 9.3 mg/dL (ref 8.9–10.3)
Chloride: 108 mmol/L (ref 98–111)
Creatinine: 1.52 mg/dL — ABNORMAL HIGH (ref 0.61–1.24)
GFR, Estimated: 49 mL/min — ABNORMAL LOW (ref >60)
Glucose, Bld: 89 mg/dL (ref 70–99)
Potassium: 4.2 mmol/L (ref 3.5–5.1)
Sodium: 139 mmol/L (ref 135–145)
Total Bilirubin: 0.2 mg/dL — ABNORMAL LOW (ref 0.3–1.2)
Total Protein: 7.3 g/dL (ref 6.5–8.1)

## 2022-01-18 ENCOUNTER — Other Ambulatory Visit: Payer: Medicare Other

## 2022-01-18 ENCOUNTER — Other Ambulatory Visit: Payer: Self-pay | Admitting: Oncology

## 2022-01-18 ENCOUNTER — Inpatient Hospital Stay (INDEPENDENT_AMBULATORY_CARE_PROVIDER_SITE_OTHER): Payer: Medicare Other | Admitting: Oncology

## 2022-01-18 ENCOUNTER — Telehealth: Payer: Self-pay | Admitting: Oncology

## 2022-01-18 VITALS — BP 139/80 | HR 77 | Temp 98.5°F | Resp 18 | Ht 69.3 in | Wt 171.4 lb

## 2022-01-18 DIAGNOSIS — D539 Nutritional anemia, unspecified: Secondary | ICD-10-CM

## 2022-01-18 NOTE — Telephone Encounter (Signed)
01/18/22 Next appt scheduled and confirmed with patient 

## 2022-01-18 NOTE — Progress Notes (Signed)
[facility]  [address] [place],  [place]  [address] [phone]  Clinic Day:  01/18/2022  Referring physician: No ref. provider found  HISTORY OF PRESENT ILLNESS:  Frank Dodson is a 72 y.o. male who our office recently began seeing for macrocytic anemia.  Extensive labs done at his initial work-up did not reveal any obvious etiology behind his anemia.  He comes in today for routine follow-up.  Since his last visit, the patient has been doing well.  He denies having increased fatigue or any overt forms of blood loss which concern him for progressive anemia.  Of note, this gentleman also has a history of deep venous thrombosis of the left lower extremity in 2007 after left knee injury, but states he was not treated with anticoagulation.  He developed bilateral pulmonary emboli in July 2017.  He was hospitalized and received IV heparin.  He then developed coffee-ground emesis, so underwent EGD.  LA grade D reflux esophagitis with friability was seen, as well as a medium sized hiatal hernia.  The exam was otherwise normal.  Due to the GI bleed, an IVC filter was placed.  A hypercoagulable evaluation revealed lupus anticoagulant.  He was discharged on Xarelto, as well as pantoprazole.  He followed up with Dr. Burr Medico and repeat testing in December 2017 confirmed the presence of a lupus anticoagulant.  She therefore recommended he continue on lifelong anticoagulation.  The IVC filter was subsequently removed in October 2017.  VITALS:  Blood pressure 139/80, pulse 77, temperature 98.5 F (36.9 C), resp. rate 18, height 5' 9.3" (1.76 m), weight 171 lb 6.4 oz (77.7 kg), SpO2 98 %.  Wt Readings from Last 3 Encounters:  01/18/22 171 lb 6.4 oz (77.7 kg)  09/18/21 156 lb 1.6 oz (70.8 kg)  06/19/21 164 lb 3.2 oz (74.5 kg)    Body mass index is 25.09 kg/m.  Performance status (ECOG): 0 - Asymptomatic  PHYSICAL EXAM:  Physical Exam Vitals and nursing note  reviewed.  Constitutional:      General: He is not in acute distress.    Appearance: Normal appearance. He is normal weight.  HENT:     Head: Normocephalic and atraumatic.     Mouth/Throat:     Mouth: Mucous membranes are moist.     Pharynx: Oropharynx is clear. No oropharyngeal exudate or posterior oropharyngeal erythema.  Eyes:     General: No scleral icterus.    Extraocular Movements: Extraocular movements intact.     Conjunctiva/sclera: Conjunctivae normal.     Pupils: Pupils are equal, round, and reactive to light.  Cardiovascular:     Rate and Rhythm: Normal rate and regular rhythm.     Heart sounds: Normal heart sounds. No murmur heard.    No friction rub. No gallop.  Pulmonary:     Effort: Pulmonary effort is normal.     Breath sounds: Normal breath sounds. No wheezing, rhonchi or rales.  Abdominal:     General: Bowel sounds are normal. There is no distension.     Palpations: Abdomen is soft. There is no hepatomegaly, splenomegaly or mass.     Tenderness: There is no abdominal tenderness.  Musculoskeletal:        General: Normal range of motion.     Cervical back: Normal range of motion and neck supple. No tenderness.     Right lower leg: No edema.     Left lower leg: No edema.  Lymphadenopathy:     Cervical:  No cervical adenopathy.     Upper Body:     Right upper body: No supraclavicular or axillary adenopathy.     Left upper body: No supraclavicular or axillary adenopathy.     Lower Body: No right inguinal adenopathy. No left inguinal adenopathy.  Skin:    General: Skin is warm and dry.     Coloration: Skin is not jaundiced.     Findings: No rash.  Neurological:     Mental Status: He is alert and oriented to person, place, and time.     Cranial Nerves: No cranial nerve deficit.  Psychiatric:        Mood and Affect: Mood normal.        Behavior: Behavior normal.        Thought Content: Thought content normal.    LABS:      Latest Ref Rng & Units 01/15/2022     2:21 PM 09/18/2021   12:00 AM 06/19/2021   12:00 AM  CBC  WBC 4.0 - 10.5 K/uL 7.0  5.5     6.7      Hemoglobin 13.0 - 17.0 g/dL 9.8  10.0     10.0      Hematocrit 39.0 - 52.0 % 30.'4  30     30      '$ Platelets 150 - 400 K/uL 221  256     250         This result is from an external source.      Latest Ref Rng & Units 01/15/2022    2:21 PM 09/18/2021   12:00 AM 06/05/2021   12:00 AM  CMP  Glucose 70 - 99 mg/dL 89     BUN 8 - 23 mg/dL '26  28     18      '$ Creatinine 0.61 - 1.24 mg/dL 1.52  1.3     1.2      Sodium 135 - 145 mmol/L 139  141     144      Potassium 3.5 - 5.1 mmol/L 4.2  4.2     3.8      Chloride 98 - 111 mmol/L 108  110     110      CO2 22 - 32 mmol/L '23  22     22      '$ Calcium 8.9 - 10.3 mg/dL 9.3  9.4     9.0      Total Protein 6.5 - 8.1 g/dL 7.3     Total Bilirubin 0.3 - 1.2 mg/dL 0.2     Alkaline Phos 38 - 126 U/L 37  43     51      AST 15 - 41 U/L 30  27     44      ALT 0 - 44 U/L '17  18     31         '$ This result is from an external source.    ASSESSMENT & PLAN:  Assessment/Plan:  A 71 y.o. male with macrocytic anemia of uncertain etiology.  This gentleman's hemoglobin of 9.8 is lower than what it has been previously.  A component of his anemia can be explained by his chronic renal insufficiency.  However, with his elevated MCV, I am somewhat concerned an underlying bone marrow disorder, such as myelodysplasia, may be present.  Despite his hemoglobin being mildly below 10, the patient feels fine; he denies having increased fatigue or other symptoms that have alerted him to be  more anemic.  Per his request, he wishes to be followed conservatively for now.  Based upon this, I will see this patient back in 3 months for repeat clinical assessment.  The patient understands all the plans discussed today and is in agreement with them.  Racquelle Hyser Macarthur Critchley, MD

## 2022-01-18 NOTE — Progress Notes (Incomplete)
[facility]  [address] [place],  [place]  [address] [phone]  Clinic Day:  01/18/2022  Referring physician: No ref. provider found  HISTORY OF PRESENT ILLNESS:  Frank Dodson is a 72 y.o. male who our office recently began seeing for macrocytic anemia.  Extensive labs done at his initial work-up did not reveal any obvious etiology behind his anemia.  He comes in today for routine follow-up.  Since his last visit, the patient has been doing well.  He denies having increased fatigue or any overt forms of blood loss which concern him for progressive anemia.  Of note, this gentleman also has a history of deep venous thrombosis of the left lower extremity in 2007 after left knee injury, but states he was not treated with anticoagulation.  He developed bilateral pulmonary emboli in July 2017.  He was hospitalized and received IV heparin.  He then developed coffee-ground emesis, so underwent EGD.  LA grade D reflux esophagitis with friability was seen, as well as a medium sized hiatal hernia.  The exam was otherwise normal.  Due to the GI bleed, an IVC filter was placed.  A hypercoagulable evaluation revealed lupus anticoagulant.  He was discharged on Xarelto, as well as pantoprazole.  He followed up with Dr. Burr Medico and repeat testing in December 2017 confirmed the presence of a lupus anticoagulant.  She therefore recommended he continue on lifelong anticoagulation.  The IVC filter was subsequently removed in October 2017.  VITALS:  There were no vitals taken for this visit.  Wt Readings from Last 3 Encounters:  09/18/21 156 lb 1.6 oz (70.8 kg)  06/19/21 164 lb 3.2 oz (74.5 kg)  06/05/21 165 lb 14.4 oz (75.3 kg)    There is no height or weight on file to calculate BMI.  Performance status (ECOG): 0 - Asymptomatic  PHYSICAL EXAM:  Physical Exam Vitals and nursing note reviewed.  Constitutional:      General: He is not in acute distress.    Appearance:  Normal appearance. He is normal weight.  HENT:     Head: Normocephalic and atraumatic.     Mouth/Throat:     Mouth: Mucous membranes are moist.     Pharynx: Oropharynx is clear. No oropharyngeal exudate or posterior oropharyngeal erythema.  Eyes:     General: No scleral icterus.    Extraocular Movements: Extraocular movements intact.     Conjunctiva/sclera: Conjunctivae normal.     Pupils: Pupils are equal, round, and reactive to light.  Cardiovascular:     Rate and Rhythm: Normal rate and regular rhythm.     Heart sounds: Normal heart sounds. No murmur heard.    No friction rub. No gallop.  Pulmonary:     Effort: Pulmonary effort is normal.     Breath sounds: Normal breath sounds. No wheezing, rhonchi or rales.  Abdominal:     General: Bowel sounds are normal. There is no distension.     Palpations: Abdomen is soft. There is no hepatomegaly, splenomegaly or mass.     Tenderness: There is no abdominal tenderness.  Musculoskeletal:        General: Normal range of motion.     Cervical back: Normal range of motion and neck supple. No tenderness.     Right lower leg: No edema.     Left lower leg: No edema.  Lymphadenopathy:     Cervical: No cervical adenopathy.     Upper Body:     Right upper  body: No supraclavicular or axillary adenopathy.     Left upper body: No supraclavicular or axillary adenopathy.     Lower Body: No right inguinal adenopathy. No left inguinal adenopathy.  Skin:    General: Skin is warm and dry.     Coloration: Skin is not jaundiced.     Findings: No rash.  Neurological:     Mental Status: He is alert and oriented to person, place, and time.     Cranial Nerves: No cranial nerve deficit.  Psychiatric:        Mood and Affect: Mood normal.        Behavior: Behavior normal.        Thought Content: Thought content normal.    LABS:      Latest Ref Rng & Units 01/15/2022    2:21 PM 09/18/2021   12:00 AM 06/19/2021   12:00 AM  CBC  WBC 4.0 - 10.5 K/uL 7.0   5.5     6.7      Hemoglobin 13.0 - 17.0 g/dL 9.8  10.0     10.0      Hematocrit 39.0 - 52.0 % 30.'4  30     30      '$ Platelets 150 - 400 K/uL 221  256     250         This result is from an external source.       Latest Ref Rng & Units 01/15/2022    2:21 PM 09/18/2021   12:00 AM 06/05/2021   12:00 AM  CMP  Glucose 70 - 99 mg/dL 89     BUN 8 - 23 mg/dL '26  28     18      '$ Creatinine 0.61 - 1.24 mg/dL 1.52  1.3     1.2      Sodium 135 - 145 mmol/L 139  141     144      Potassium 3.5 - 5.1 mmol/L 4.2  4.2     3.8      Chloride 98 - 111 mmol/L 108  110     110      CO2 22 - 32 mmol/L '23  22     22      '$ Calcium 8.9 - 10.3 mg/dL 9.3  9.4     9.0      Total Protein 6.5 - 8.1 g/dL 7.3     Total Bilirubin 0.3 - 1.2 mg/dL 0.2     Alkaline Phos 38 - 126 U/L 37  43     51      AST 15 - 41 U/L 30  27     44      ALT 0 - 44 U/L '17  18     31         '$ This result is from an external source.     ASSESSMENT & PLAN:  Assessment/Plan:  A 72 y.o. male with macrocytic anemia of uncertain etiology.  This gentleman's hemoglobin of 10 today is stable.  Furthermore, he claims that he is doing well.  As long as his hemoglobin stays at this level, his anemia will continue to be followed conservatively.  I will see him back in 4 months for repeat clinical assessment.  The patient understands all the plans discussed today and is in agreement with them.  Jakhi Dishman Macarthur Critchley, MD

## 2022-01-19 DIAGNOSIS — M2550 Pain in unspecified joint: Secondary | ICD-10-CM | POA: Diagnosis not present

## 2022-01-19 DIAGNOSIS — M1009 Idiopathic gout, multiple sites: Secondary | ICD-10-CM | POA: Diagnosis not present

## 2022-01-19 DIAGNOSIS — M0589 Other rheumatoid arthritis with rheumatoid factor of multiple sites: Secondary | ICD-10-CM | POA: Diagnosis not present

## 2022-01-19 DIAGNOSIS — R21 Rash and other nonspecific skin eruption: Secondary | ICD-10-CM | POA: Diagnosis not present

## 2022-01-19 DIAGNOSIS — Z6824 Body mass index (BMI) 24.0-24.9, adult: Secondary | ICD-10-CM | POA: Diagnosis not present

## 2022-01-19 DIAGNOSIS — K5080 Crohn's disease of both small and large intestine without complications: Secondary | ICD-10-CM | POA: Diagnosis not present

## 2022-01-19 DIAGNOSIS — Z79899 Other long term (current) drug therapy: Secondary | ICD-10-CM | POA: Diagnosis not present

## 2022-01-20 DIAGNOSIS — K50818 Crohn's disease of both small and large intestine with other complication: Secondary | ICD-10-CM | POA: Diagnosis not present

## 2022-01-20 DIAGNOSIS — Z79899 Other long term (current) drug therapy: Secondary | ICD-10-CM | POA: Diagnosis not present

## 2022-01-20 DIAGNOSIS — R5383 Other fatigue: Secondary | ICD-10-CM | POA: Diagnosis not present

## 2022-01-20 DIAGNOSIS — M0589 Other rheumatoid arthritis with rheumatoid factor of multiple sites: Secondary | ICD-10-CM | POA: Diagnosis not present

## 2022-03-17 DIAGNOSIS — M0589 Other rheumatoid arthritis with rheumatoid factor of multiple sites: Secondary | ICD-10-CM | POA: Diagnosis not present

## 2022-04-07 DIAGNOSIS — G894 Chronic pain syndrome: Secondary | ICD-10-CM | POA: Diagnosis not present

## 2022-04-07 DIAGNOSIS — M47819 Spondylosis without myelopathy or radiculopathy, site unspecified: Secondary | ICD-10-CM | POA: Diagnosis not present

## 2022-04-07 DIAGNOSIS — Z79899 Other long term (current) drug therapy: Secondary | ICD-10-CM | POA: Diagnosis not present

## 2022-04-07 DIAGNOSIS — M069 Rheumatoid arthritis, unspecified: Secondary | ICD-10-CM | POA: Diagnosis not present

## 2022-04-18 ENCOUNTER — Other Ambulatory Visit: Payer: Self-pay | Admitting: Oncology

## 2022-04-18 NOTE — Progress Notes (Signed)
[facility]  [address] [place],  [place]  [id number] [phone]  Clinic Day:  04/19/2022  Referring physician: Remo LippsWaddell, Daniel P, PA  HISTORY OF PRESENT ILLNESS:  Frank PoliceJohn Bernard Dodson is a 72 y.o. male who our office recently began seeing for macrocytic anemia.  Extensive labs done at his initial work-up did not reveal any obvious etiology behind his anemia.  He comes in today for routine follow-up.  Since his last visit, the patient has been doing well.  He continues to deny having increased fatigue or any overt forms of blood loss which concern him for progressive anemia.  Of note, this gentleman also has a history of deep venous thrombosis of the left lower extremity in 2007 after left knee injury, but states he was not treated with anticoagulation.  He developed bilateral pulmonary emboli in July 2017.  He was hospitalized and received IV heparin.  He then developed coffee-ground emesis, so underwent EGD.  LA grade D reflux esophagitis with friability was seen, as well as a medium sized hiatal hernia.  The exam was otherwise normal.  Due to the GI bleed, an IVC filter was placed.  A hypercoagulable evaluation revealed lupus anticoagulant.  He was discharged on Xarelto, as well as pantoprazole.  He followed up with Dr. Mosetta PuttFeng and repeat testing in December 2017 confirmed the presence of a lupus anticoagulant.  She therefore recommended he continue on lifelong anticoagulation.  The IVC filter was subsequently removed in October 2017.  VITALS:  Blood pressure (!) 143/81, pulse 82, temperature 98.9 F (37.2 C), resp. rate 16, height 5' 9.3" (1.76 m), weight 170 lb 4.8 oz (77.2 kg), SpO2 98 %.  Wt Readings from Last 3 Encounters:  04/19/22 170 lb 4.8 oz (77.2 kg)  01/18/22 171 lb 6.4 oz (77.7 kg)  09/18/21 156 lb 1.6 oz (70.8 kg)    Body mass index is 24.93 kg/m.  Performance status (ECOG): 0 - Asymptomatic  PHYSICAL EXAM:  Physical Exam Vitals and nursing  note reviewed.  Constitutional:      General: He is not in acute distress.    Appearance: Normal appearance. He is normal weight.  HENT:     Head: Normocephalic and atraumatic.     Mouth/Throat:     Mouth: Mucous membranes are moist.     Pharynx: Oropharynx is clear. No oropharyngeal exudate or posterior oropharyngeal erythema.  Eyes:     General: No scleral icterus.    Extraocular Movements: Extraocular movements intact.     Conjunctiva/sclera: Conjunctivae normal.     Pupils: Pupils are equal, round, and reactive to light.  Cardiovascular:     Rate and Rhythm: Normal rate and regular rhythm.     Heart sounds: Normal heart sounds. No murmur heard.    No friction rub. No gallop.  Pulmonary:     Effort: Pulmonary effort is normal.     Breath sounds: Normal breath sounds. No wheezing, rhonchi or rales.  Abdominal:     General: Bowel sounds are normal. There is no distension.     Palpations: Abdomen is soft. There is no hepatomegaly, splenomegaly or mass.     Tenderness: There is no abdominal tenderness.  Musculoskeletal:        General: Normal range of motion.     Cervical back: Normal range of motion and neck supple. No tenderness.     Right lower leg: No edema.     Left lower leg: No edema.  Lymphadenopathy:  Cervical: No cervical adenopathy.     Upper Body:     Right upper body: No supraclavicular or axillary adenopathy.     Left upper body: No supraclavicular or axillary adenopathy.     Lower Body: No right inguinal adenopathy. No left inguinal adenopathy.  Skin:    General: Skin is warm and dry.     Coloration: Skin is not jaundiced.     Findings: No rash.  Neurological:     Mental Status: He is alert and oriented to person, place, and time.     Cranial Nerves: No cranial nerve deficit.  Psychiatric:        Mood and Affect: Mood normal.        Behavior: Behavior normal.        Thought Content: Thought content normal.   LABS:      Latest Ref Rng & Units  04/19/2022   10:26 AM 01/15/2022    2:21 PM 09/18/2021   12:00 AM  CBC  WBC 4.0 - 10.5 K/uL 6.3  7.0  5.5      Hemoglobin 13.0 - 17.0 g/dL 91.6  9.8  60.6      Hematocrit 39.0 - 52.0 % 31.9  30.4  30      Platelets 150 - 400 K/uL 214  221  256         This result is from an external source.      Latest Ref Rng & Units 04/19/2022   10:26 AM 01/15/2022    2:21 PM 09/18/2021   12:00 AM  CMP  Glucose 70 - 99 mg/dL 004  89    BUN 8 - 23 mg/dL 27  26  28       Creatinine 0.61 - 1.24 mg/dL 5.99  7.74  1.3      Sodium 135 - 145 mmol/L 142  139  141      Potassium 3.5 - 5.1 mmol/L 3.9  4.2  4.2      Chloride 98 - 111 mmol/L 109  108  110      CO2 22 - 32 mmol/L 24  23  22       Calcium 8.9 - 10.3 mg/dL 9.3  9.3  9.4      Total Protein 6.5 - 8.1 g/dL 7.5  7.3    Total Bilirubin 0.3 - 1.2 mg/dL 0.6  0.2    Alkaline Phos 38 - 126 U/L 33  37  43      AST 15 - 41 U/L 31  30  27       ALT 0 - 44 U/L 22  17  18          This result is from an external source.    Latest Reference Range & Units 04/19/22 10:04  Iron 45 - 182 ug/dL 68  UIBC ug/dL 142  TIBC 395 - 320 ug/dL 233  Saturation Ratios 17.9 - 39.5 % 24  Ferritin 24 - 336 ng/mL 69  Folate >5.9 ng/mL 35.1  Vitamin B12 180 - 914 pg/mL 612   ASSESSMENT & PLAN:  Assessment/Plan:  A 72 y.o. male with macrocytic anemia.  This gentleman's hemoglobin of 10.6 is better than what it has been previously.  A component of his anemia can be explained by his chronic renal insufficiency.  However, as his hemoglobin is above 10 and he is clinically doing well, he will continue to be followed conservatively.  I will see him back in 4 months for repeat clinical assessment.  The patient understands all the plans discussed today and is in agreement with them.  Danelly Hassinger Kirby Funk, MD

## 2022-04-19 ENCOUNTER — Other Ambulatory Visit: Payer: Self-pay | Admitting: Oncology

## 2022-04-19 ENCOUNTER — Inpatient Hospital Stay: Payer: Medicare Other

## 2022-04-19 ENCOUNTER — Inpatient Hospital Stay: Payer: Medicare Other | Attending: Oncology | Admitting: Oncology

## 2022-04-19 VITALS — BP 143/81 | HR 82 | Temp 98.9°F | Resp 16 | Ht 69.3 in | Wt 170.3 lb

## 2022-04-19 DIAGNOSIS — D649 Anemia, unspecified: Secondary | ICD-10-CM

## 2022-04-19 DIAGNOSIS — D539 Nutritional anemia, unspecified: Secondary | ICD-10-CM | POA: Diagnosis not present

## 2022-04-19 LAB — CBC WITH DIFFERENTIAL (CANCER CENTER ONLY)
Abs Immature Granulocytes: 0.01 10*3/uL (ref 0.00–0.07)
Basophils Absolute: 0 10*3/uL (ref 0.0–0.1)
Basophils Relative: 1 %
Eosinophils Absolute: 0.1 10*3/uL (ref 0.0–0.5)
Eosinophils Relative: 2 %
HCT: 31.9 % — ABNORMAL LOW (ref 39.0–52.0)
Hemoglobin: 10.6 g/dL — ABNORMAL LOW (ref 13.0–17.0)
Immature Granulocytes: 0 %
Lymphocytes Relative: 38 %
Lymphs Abs: 2.4 10*3/uL (ref 0.7–4.0)
MCH: 33.8 pg (ref 26.0–34.0)
MCHC: 33.2 g/dL (ref 30.0–36.0)
MCV: 101.6 fL — ABNORMAL HIGH (ref 80.0–100.0)
Monocytes Absolute: 0.6 10*3/uL (ref 0.1–1.0)
Monocytes Relative: 9 %
Neutro Abs: 3.1 10*3/uL (ref 1.7–7.7)
Neutrophils Relative %: 50 %
Platelet Count: 214 10*3/uL (ref 150–400)
RBC: 3.14 MIL/uL — ABNORMAL LOW (ref 4.22–5.81)
RDW: 14.3 % (ref 11.5–15.5)
WBC Count: 6.3 10*3/uL (ref 4.0–10.5)
nRBC: 0 % (ref 0.0–0.2)

## 2022-04-19 LAB — CMP (CANCER CENTER ONLY)
ALT: 22 U/L (ref 0–44)
AST: 31 U/L (ref 15–41)
Albumin: 4.3 g/dL (ref 3.5–5.0)
Alkaline Phosphatase: 33 U/L — ABNORMAL LOW (ref 38–126)
Anion gap: 9 (ref 5–15)
BUN: 27 mg/dL — ABNORMAL HIGH (ref 8–23)
CO2: 24 mmol/L (ref 22–32)
Calcium: 9.3 mg/dL (ref 8.9–10.3)
Chloride: 109 mmol/L (ref 98–111)
Creatinine: 1.36 mg/dL — ABNORMAL HIGH (ref 0.61–1.24)
GFR, Estimated: 56 mL/min — ABNORMAL LOW (ref >60)
Glucose, Bld: 107 mg/dL — ABNORMAL HIGH (ref 70–99)
Potassium: 3.9 mmol/L (ref 3.5–5.1)
Sodium: 142 mmol/L (ref 135–145)
Total Bilirubin: 0.6 mg/dL (ref 0.3–1.2)
Total Protein: 7.5 g/dL (ref 6.5–8.1)

## 2022-04-19 LAB — IRON AND TIBC
Iron: 68 ug/dL (ref 45–182)
Saturation Ratios: 24 % (ref 17.9–39.5)
TIBC: 280 ug/dL (ref 250–450)
UIBC: 212 ug/dL

## 2022-04-19 LAB — FOLATE: Folate: 35.1 ng/mL (ref >5.9)

## 2022-04-19 LAB — VITAMIN B12: Vitamin B-12: 612 pg/mL (ref 180–914)

## 2022-04-19 LAB — FERRITIN: Ferritin: 69 ng/mL (ref 24–336)

## 2022-04-20 ENCOUNTER — Telehealth: Payer: Self-pay | Admitting: Oncology

## 2022-04-20 NOTE — Telephone Encounter (Signed)
  Latest Reference Range & Units 04/19/22 10:04   Iron 45 - 182 ug/dL 68  UIBC ug/dL 176  TIBC 160 - 737 ug/dL 106  Saturation Ratios 17.9 - 39.5 % 24  Ferritin 24 - 336 ng/mL 69  Folate >5.9 ng/mL 35.1  Vitamin B12 180 - 914 pg/mL 612    ASSESSMENT & PLAN:  Assessment/Plan:  A 72 y.o. male with macrocytic anemia.  This gentleman's hemoglobin of 10.6 is better than what it has been previously.  A component of his anemia can be explained by his chronic renal insufficiency.  However, as his hemoglobin is above 10 and he is clinically doing well, he will continue to be followed conservatively.  I will see him back in 4 months for repeat clinical assessment.  The patient understands all the plans discussed today and is in agreement with them.   Dequincy Kirby Funk, MD

## 2022-05-06 DIAGNOSIS — Z79899 Other long term (current) drug therapy: Secondary | ICD-10-CM | POA: Diagnosis not present

## 2022-05-06 DIAGNOSIS — L209 Atopic dermatitis, unspecified: Secondary | ICD-10-CM | POA: Diagnosis not present

## 2022-05-12 DIAGNOSIS — Z79899 Other long term (current) drug therapy: Secondary | ICD-10-CM | POA: Diagnosis not present

## 2022-05-12 DIAGNOSIS — M0589 Other rheumatoid arthritis with rheumatoid factor of multiple sites: Secondary | ICD-10-CM | POA: Diagnosis not present

## 2022-05-12 DIAGNOSIS — K50818 Crohn's disease of both small and large intestine with other complication: Secondary | ICD-10-CM | POA: Diagnosis not present

## 2022-05-26 DIAGNOSIS — Z9181 History of falling: Secondary | ICD-10-CM | POA: Diagnosis not present

## 2022-05-26 DIAGNOSIS — D649 Anemia, unspecified: Secondary | ICD-10-CM | POA: Diagnosis not present

## 2022-05-26 DIAGNOSIS — E559 Vitamin D deficiency, unspecified: Secondary | ICD-10-CM | POA: Diagnosis not present

## 2022-05-26 DIAGNOSIS — G4700 Insomnia, unspecified: Secondary | ICD-10-CM | POA: Diagnosis not present

## 2022-05-26 DIAGNOSIS — Z139 Encounter for screening, unspecified: Secondary | ICD-10-CM | POA: Diagnosis not present

## 2022-05-26 DIAGNOSIS — I10 Essential (primary) hypertension: Secondary | ICD-10-CM | POA: Diagnosis not present

## 2022-05-26 DIAGNOSIS — K5090 Crohn's disease, unspecified, without complications: Secondary | ICD-10-CM | POA: Diagnosis not present

## 2022-05-26 DIAGNOSIS — K219 Gastro-esophageal reflux disease without esophagitis: Secondary | ICD-10-CM | POA: Diagnosis not present

## 2022-05-26 DIAGNOSIS — Z1331 Encounter for screening for depression: Secondary | ICD-10-CM | POA: Diagnosis not present

## 2022-07-07 DIAGNOSIS — M0589 Other rheumatoid arthritis with rheumatoid factor of multiple sites: Secondary | ICD-10-CM | POA: Diagnosis not present

## 2022-07-07 DIAGNOSIS — K50818 Crohn's disease of both small and large intestine with other complication: Secondary | ICD-10-CM | POA: Diagnosis not present

## 2022-07-08 DIAGNOSIS — M069 Rheumatoid arthritis, unspecified: Secondary | ICD-10-CM | POA: Diagnosis not present

## 2022-07-08 DIAGNOSIS — G894 Chronic pain syndrome: Secondary | ICD-10-CM | POA: Diagnosis not present

## 2022-07-20 DIAGNOSIS — M0589 Other rheumatoid arthritis with rheumatoid factor of multiple sites: Secondary | ICD-10-CM | POA: Diagnosis not present

## 2022-07-20 DIAGNOSIS — M1009 Idiopathic gout, multiple sites: Secondary | ICD-10-CM | POA: Diagnosis not present

## 2022-07-20 DIAGNOSIS — Z79899 Other long term (current) drug therapy: Secondary | ICD-10-CM | POA: Diagnosis not present

## 2022-07-20 DIAGNOSIS — Z6823 Body mass index (BMI) 23.0-23.9, adult: Secondary | ICD-10-CM | POA: Diagnosis not present

## 2022-07-20 DIAGNOSIS — K50818 Crohn's disease of both small and large intestine with other complication: Secondary | ICD-10-CM | POA: Diagnosis not present

## 2022-08-18 NOTE — Progress Notes (Unsigned)
[facility]  [address] [place],  [place]  [address] [phone]  Clinic Day:  08/19/2022  Referring physician: Ellis Parents, FNP  TELEHEALTH VISIT  HISTORY OF PRESENT ILLNESS:  Frank Dodson is a 72 y.o. male with macrocytic anemia.  A component of his anemia appears to be due to chronic renal insufficiency.  He comes in today for routine follow-up.  Since his last visit, the patient has been doing well.  He continues to deny having increased fatigue or any overt forms of blood loss which concern him for progressive anemia.  Of note, this gentleman also has a history of deep venous thrombosis of the left lower extremity in 2007 after left knee injury, but states he was not treated with anticoagulation.  He developed bilateral pulmonary emboli in July 2017.  He was hospitalized and received IV heparin.  He then developed coffee-ground emesis, so underwent EGD.  LA grade D reflux esophagitis with friability was seen, as well as a medium sized hiatal hernia.  The exam was otherwise normal.  Due to the GI bleed, an IVC filter was placed.  A hypercoagulable evaluation revealed lupus anticoagulant.  He was discharged on Xarelto, as well as pantoprazole.  He followed up with Dr. Mosetta Putt and repeat testing in December 2017 confirmed the presence of a lupus anticoagulant.  She therefore recommended he continue on lifelong anticoagulation.  The IVC filter was subsequently removed in October 2017.  PHYSICAL EXAM: DEFERRED   LABS:      Latest Ref Rng & Units 08/19/2022   12:00 AM 04/19/2022   10:26 AM 01/15/2022    2:21 PM  CBC  WBC  5.9     6.3  7.0   Hemoglobin 13.5 - 17.5 10.7     10.6  9.8   Hematocrit 41 - 53 31     31.9  30.4   Platelets 150 - 400 K/uL 232     214  221      This result is from an external source.      Latest Ref Rng & Units 08/19/2022   10:45 AM 04/19/2022   10:26 AM 01/15/2022    2:21 PM  CMP  Glucose 70 - 99 mg/dL 914  782  89    BUN 8 - 23 mg/dL 38  27  26   Creatinine 0.61 - 1.24 mg/dL 9.56  2.13  0.86   Sodium 135 - 145 mmol/L 140  142  139   Potassium 3.5 - 5.1 mmol/L 4.1  3.9  4.2   Chloride 98 - 111 mmol/L 110  109  108   CO2 22 - 32 mmol/L 22  24  23    Calcium 8.9 - 10.3 mg/dL 9.3  9.3  9.3   Total Protein 6.5 - 8.1 g/dL 7.6  7.5  7.3   Total Bilirubin 0.3 - 1.2 mg/dL 0.5  0.6  0.2   Alkaline Phos 38 - 126 U/L 34  33  37   AST 15 - 41 U/L 23  31  30    ALT 0 - 44 U/L 16  22  17     ASSESSMENT & PLAN:  Assessment/Plan:  A 72 y.o. male with macrocytic anemia, with a component likely related to his chronic renal insufficiency This gentleman's hemoglobin of 10.7 is slightly better than what it has been previously.   However, as his hemoglobin is above 10 and he is clinically doing well, he will continue to be  followed conservatively.  I will see him back in 6 months for repeat clinical assessment.  The patient understands all the plans discussed today and is in agreement with them.   Kirby Funk, MD

## 2022-08-19 ENCOUNTER — Inpatient Hospital Stay: Payer: Medicare Other | Attending: Oncology | Admitting: Oncology

## 2022-08-19 ENCOUNTER — Other Ambulatory Visit: Payer: Self-pay | Admitting: Oncology

## 2022-08-19 ENCOUNTER — Inpatient Hospital Stay: Payer: Medicare Other

## 2022-08-19 VITALS — BP 124/71 | HR 84 | Temp 98.7°F | Resp 14 | Ht 69.3 in | Wt 164.1 lb

## 2022-08-19 DIAGNOSIS — N189 Chronic kidney disease, unspecified: Secondary | ICD-10-CM | POA: Insufficient documentation

## 2022-08-19 DIAGNOSIS — D649 Anemia, unspecified: Secondary | ICD-10-CM

## 2022-08-19 DIAGNOSIS — D631 Anemia in chronic kidney disease: Secondary | ICD-10-CM | POA: Insufficient documentation

## 2022-08-19 DIAGNOSIS — D539 Nutritional anemia, unspecified: Secondary | ICD-10-CM

## 2022-08-19 LAB — CMP (CANCER CENTER ONLY)
ALT: 16 U/L (ref 0–44)
AST: 23 U/L (ref 15–41)
Albumin: 4.1 g/dL (ref 3.5–5.0)
Alkaline Phosphatase: 34 U/L — ABNORMAL LOW (ref 38–126)
Anion gap: 8 (ref 5–15)
BUN: 38 mg/dL — ABNORMAL HIGH (ref 8–23)
CO2: 22 mmol/L (ref 22–32)
Calcium: 9.3 mg/dL (ref 8.9–10.3)
Chloride: 110 mmol/L (ref 98–111)
Creatinine: 1.56 mg/dL — ABNORMAL HIGH (ref 0.61–1.24)
GFR, Estimated: 47 mL/min — ABNORMAL LOW (ref >60)
Glucose, Bld: 113 mg/dL — ABNORMAL HIGH (ref 70–99)
Potassium: 4.1 mmol/L (ref 3.5–5.1)
Sodium: 140 mmol/L (ref 135–145)
Total Bilirubin: 0.5 mg/dL (ref 0.3–1.2)
Total Protein: 7.6 g/dL (ref 6.5–8.1)

## 2022-08-19 LAB — CBC AND DIFFERENTIAL
HCT: 31 — AB (ref 41–53)
Hemoglobin: 10.7 — AB (ref 13.5–17.5)
Neutrophils Absolute: 3.48
Platelets: 232 10*3/uL (ref 150–400)
WBC: 5.9

## 2022-08-19 LAB — CBC: RBC: 3.15 — AB (ref 3.87–5.11)

## 2022-09-01 DIAGNOSIS — M0589 Other rheumatoid arthritis with rheumatoid factor of multiple sites: Secondary | ICD-10-CM | POA: Diagnosis not present

## 2022-09-01 DIAGNOSIS — K50818 Crohn's disease of both small and large intestine with other complication: Secondary | ICD-10-CM | POA: Diagnosis not present

## 2022-09-02 DIAGNOSIS — Z1331 Encounter for screening for depression: Secondary | ICD-10-CM | POA: Diagnosis not present

## 2022-09-02 DIAGNOSIS — Z0000 Encounter for general adult medical examination without abnormal findings: Secondary | ICD-10-CM | POA: Diagnosis not present

## 2022-09-02 DIAGNOSIS — Z9181 History of falling: Secondary | ICD-10-CM | POA: Diagnosis not present

## 2022-09-02 DIAGNOSIS — Z139 Encounter for screening, unspecified: Secondary | ICD-10-CM | POA: Diagnosis not present

## 2022-09-30 DIAGNOSIS — Z23 Encounter for immunization: Secondary | ICD-10-CM | POA: Diagnosis not present

## 2022-10-06 DIAGNOSIS — G894 Chronic pain syndrome: Secondary | ICD-10-CM | POA: Diagnosis not present

## 2022-10-06 DIAGNOSIS — M069 Rheumatoid arthritis, unspecified: Secondary | ICD-10-CM | POA: Diagnosis not present

## 2022-10-06 DIAGNOSIS — Z79899 Other long term (current) drug therapy: Secondary | ICD-10-CM | POA: Diagnosis not present

## 2022-10-06 DIAGNOSIS — M47819 Spondylosis without myelopathy or radiculopathy, site unspecified: Secondary | ICD-10-CM | POA: Diagnosis not present

## 2022-10-27 DIAGNOSIS — Z79899 Other long term (current) drug therapy: Secondary | ICD-10-CM | POA: Diagnosis not present

## 2022-10-27 DIAGNOSIS — K50818 Crohn's disease of both small and large intestine with other complication: Secondary | ICD-10-CM | POA: Diagnosis not present

## 2022-10-27 DIAGNOSIS — M0589 Other rheumatoid arthritis with rheumatoid factor of multiple sites: Secondary | ICD-10-CM | POA: Diagnosis not present

## 2022-11-04 DIAGNOSIS — Z79899 Other long term (current) drug therapy: Secondary | ICD-10-CM | POA: Diagnosis not present

## 2022-11-04 DIAGNOSIS — L209 Atopic dermatitis, unspecified: Secondary | ICD-10-CM | POA: Diagnosis not present

## 2022-11-29 DIAGNOSIS — K219 Gastro-esophageal reflux disease without esophagitis: Secondary | ICD-10-CM | POA: Diagnosis not present

## 2022-11-29 DIAGNOSIS — M069 Rheumatoid arthritis, unspecified: Secondary | ICD-10-CM | POA: Diagnosis not present

## 2022-11-29 DIAGNOSIS — K5090 Crohn's disease, unspecified, without complications: Secondary | ICD-10-CM | POA: Diagnosis not present

## 2022-11-29 DIAGNOSIS — Z83438 Family history of other disorder of lipoprotein metabolism and other lipidemia: Secondary | ICD-10-CM | POA: Diagnosis not present

## 2022-11-29 DIAGNOSIS — I10 Essential (primary) hypertension: Secondary | ICD-10-CM | POA: Diagnosis not present

## 2022-11-29 DIAGNOSIS — D649 Anemia, unspecified: Secondary | ICD-10-CM | POA: Diagnosis not present

## 2022-11-30 LAB — COMPREHENSIVE METABOLIC PANEL: EGFR: 55

## 2022-12-22 DIAGNOSIS — Z111 Encounter for screening for respiratory tuberculosis: Secondary | ICD-10-CM | POA: Diagnosis not present

## 2022-12-22 DIAGNOSIS — K50818 Crohn's disease of both small and large intestine with other complication: Secondary | ICD-10-CM | POA: Diagnosis not present

## 2022-12-22 DIAGNOSIS — M069 Rheumatoid arthritis, unspecified: Secondary | ICD-10-CM | POA: Diagnosis not present

## 2022-12-22 DIAGNOSIS — G894 Chronic pain syndrome: Secondary | ICD-10-CM | POA: Diagnosis not present

## 2022-12-22 DIAGNOSIS — M0589 Other rheumatoid arthritis with rheumatoid factor of multiple sites: Secondary | ICD-10-CM | POA: Diagnosis not present

## 2022-12-22 DIAGNOSIS — Z79899 Other long term (current) drug therapy: Secondary | ICD-10-CM | POA: Diagnosis not present

## 2022-12-22 DIAGNOSIS — R5383 Other fatigue: Secondary | ICD-10-CM | POA: Diagnosis not present

## 2023-01-13 DIAGNOSIS — G2581 Restless legs syndrome: Secondary | ICD-10-CM | POA: Diagnosis not present

## 2023-01-13 DIAGNOSIS — H9201 Otalgia, right ear: Secondary | ICD-10-CM | POA: Diagnosis not present

## 2023-01-13 DIAGNOSIS — H9193 Unspecified hearing loss, bilateral: Secondary | ICD-10-CM | POA: Diagnosis not present

## 2023-01-19 DIAGNOSIS — K5080 Crohn's disease of both small and large intestine without complications: Secondary | ICD-10-CM | POA: Diagnosis not present

## 2023-01-19 DIAGNOSIS — Z6824 Body mass index (BMI) 24.0-24.9, adult: Secondary | ICD-10-CM | POA: Diagnosis not present

## 2023-01-19 DIAGNOSIS — Z79899 Other long term (current) drug therapy: Secondary | ICD-10-CM | POA: Diagnosis not present

## 2023-01-19 DIAGNOSIS — M0589 Other rheumatoid arthritis with rheumatoid factor of multiple sites: Secondary | ICD-10-CM | POA: Diagnosis not present

## 2023-01-19 DIAGNOSIS — M1009 Idiopathic gout, multiple sites: Secondary | ICD-10-CM | POA: Diagnosis not present

## 2023-02-14 DIAGNOSIS — H6122 Impacted cerumen, left ear: Secondary | ICD-10-CM | POA: Diagnosis not present

## 2023-02-14 DIAGNOSIS — H6091 Unspecified otitis externa, right ear: Secondary | ICD-10-CM | POA: Diagnosis not present

## 2023-02-14 DIAGNOSIS — H9193 Unspecified hearing loss, bilateral: Secondary | ICD-10-CM | POA: Diagnosis not present

## 2023-02-16 DIAGNOSIS — M0589 Other rheumatoid arthritis with rheumatoid factor of multiple sites: Secondary | ICD-10-CM | POA: Diagnosis not present

## 2023-02-21 DIAGNOSIS — H7321 Unspecified myringitis, right ear: Secondary | ICD-10-CM | POA: Diagnosis not present

## 2023-02-21 DIAGNOSIS — H6091 Unspecified otitis externa, right ear: Secondary | ICD-10-CM | POA: Diagnosis not present

## 2023-02-25 DIAGNOSIS — Q181 Preauricular sinus and cyst: Secondary | ICD-10-CM | POA: Diagnosis not present

## 2023-02-25 DIAGNOSIS — Z8669 Personal history of other diseases of the nervous system and sense organs: Secondary | ICD-10-CM | POA: Diagnosis not present

## 2023-03-22 DIAGNOSIS — M069 Rheumatoid arthritis, unspecified: Secondary | ICD-10-CM | POA: Diagnosis not present

## 2023-03-22 DIAGNOSIS — G894 Chronic pain syndrome: Secondary | ICD-10-CM | POA: Diagnosis not present

## 2023-03-22 DIAGNOSIS — Z79899 Other long term (current) drug therapy: Secondary | ICD-10-CM | POA: Diagnosis not present

## 2023-04-01 DIAGNOSIS — Q181 Preauricular sinus and cyst: Secondary | ICD-10-CM | POA: Diagnosis not present

## 2023-04-01 DIAGNOSIS — L089 Local infection of the skin and subcutaneous tissue, unspecified: Secondary | ICD-10-CM | POA: Diagnosis not present

## 2023-04-11 DIAGNOSIS — H7441 Polyp of right middle ear: Secondary | ICD-10-CM | POA: Diagnosis not present

## 2023-04-13 DIAGNOSIS — M0589 Other rheumatoid arthritis with rheumatoid factor of multiple sites: Secondary | ICD-10-CM | POA: Diagnosis not present

## 2023-04-25 DIAGNOSIS — H61891 Other specified disorders of right external ear: Secondary | ICD-10-CM | POA: Diagnosis not present

## 2023-05-04 DIAGNOSIS — H7441 Polyp of right middle ear: Secondary | ICD-10-CM | POA: Diagnosis not present

## 2023-05-04 DIAGNOSIS — H6091 Unspecified otitis externa, right ear: Secondary | ICD-10-CM | POA: Diagnosis not present

## 2023-05-11 DIAGNOSIS — H9191 Unspecified hearing loss, right ear: Secondary | ICD-10-CM | POA: Diagnosis not present

## 2023-05-11 DIAGNOSIS — H7441 Polyp of right middle ear: Secondary | ICD-10-CM | POA: Diagnosis not present

## 2023-05-17 DIAGNOSIS — Z79899 Other long term (current) drug therapy: Secondary | ICD-10-CM | POA: Diagnosis not present

## 2023-05-17 DIAGNOSIS — L209 Atopic dermatitis, unspecified: Secondary | ICD-10-CM | POA: Diagnosis not present

## 2023-05-27 DIAGNOSIS — Z79899 Other long term (current) drug therapy: Secondary | ICD-10-CM | POA: Diagnosis not present

## 2023-05-30 DIAGNOSIS — E559 Vitamin D deficiency, unspecified: Secondary | ICD-10-CM | POA: Diagnosis not present

## 2023-05-30 DIAGNOSIS — H7441 Polyp of right middle ear: Secondary | ICD-10-CM | POA: Diagnosis not present

## 2023-05-30 DIAGNOSIS — Z125 Encounter for screening for malignant neoplasm of prostate: Secondary | ICD-10-CM | POA: Diagnosis not present

## 2023-05-30 DIAGNOSIS — G2581 Restless legs syndrome: Secondary | ICD-10-CM | POA: Diagnosis not present

## 2023-05-30 DIAGNOSIS — K219 Gastro-esophageal reflux disease without esophagitis: Secondary | ICD-10-CM | POA: Diagnosis not present

## 2023-05-30 DIAGNOSIS — M069 Rheumatoid arthritis, unspecified: Secondary | ICD-10-CM | POA: Diagnosis not present

## 2023-05-30 DIAGNOSIS — K5090 Crohn's disease, unspecified, without complications: Secondary | ICD-10-CM | POA: Diagnosis not present

## 2023-05-30 DIAGNOSIS — G4700 Insomnia, unspecified: Secondary | ICD-10-CM | POA: Diagnosis not present

## 2023-05-30 DIAGNOSIS — E781 Pure hyperglyceridemia: Secondary | ICD-10-CM | POA: Diagnosis not present

## 2023-05-30 DIAGNOSIS — H9190 Unspecified hearing loss, unspecified ear: Secondary | ICD-10-CM | POA: Diagnosis not present

## 2023-05-30 DIAGNOSIS — I10 Essential (primary) hypertension: Secondary | ICD-10-CM | POA: Diagnosis not present

## 2023-06-08 DIAGNOSIS — Z79899 Other long term (current) drug therapy: Secondary | ICD-10-CM | POA: Diagnosis not present

## 2023-06-08 DIAGNOSIS — R5383 Other fatigue: Secondary | ICD-10-CM | POA: Diagnosis not present

## 2023-06-08 DIAGNOSIS — M0589 Other rheumatoid arthritis with rheumatoid factor of multiple sites: Secondary | ICD-10-CM | POA: Diagnosis not present

## 2023-06-08 DIAGNOSIS — K50818 Crohn's disease of both small and large intestine with other complication: Secondary | ICD-10-CM | POA: Diagnosis not present

## 2023-06-13 DIAGNOSIS — Z8669 Personal history of other diseases of the nervous system and sense organs: Secondary | ICD-10-CM | POA: Diagnosis not present

## 2023-06-13 DIAGNOSIS — H903 Sensorineural hearing loss, bilateral: Secondary | ICD-10-CM | POA: Diagnosis not present

## 2023-06-20 DIAGNOSIS — M069 Rheumatoid arthritis, unspecified: Secondary | ICD-10-CM | POA: Diagnosis not present

## 2023-06-20 DIAGNOSIS — G894 Chronic pain syndrome: Secondary | ICD-10-CM | POA: Diagnosis not present

## 2023-07-26 DIAGNOSIS — E663 Overweight: Secondary | ICD-10-CM | POA: Diagnosis not present

## 2023-07-26 DIAGNOSIS — K50818 Crohn's disease of both small and large intestine with other complication: Secondary | ICD-10-CM | POA: Diagnosis not present

## 2023-07-26 DIAGNOSIS — M1009 Idiopathic gout, multiple sites: Secondary | ICD-10-CM | POA: Diagnosis not present

## 2023-07-26 DIAGNOSIS — M1991 Primary osteoarthritis, unspecified site: Secondary | ICD-10-CM | POA: Diagnosis not present

## 2023-07-26 DIAGNOSIS — Z6825 Body mass index (BMI) 25.0-25.9, adult: Secondary | ICD-10-CM | POA: Diagnosis not present

## 2023-07-26 DIAGNOSIS — Z79899 Other long term (current) drug therapy: Secondary | ICD-10-CM | POA: Diagnosis not present

## 2023-07-26 DIAGNOSIS — M0589 Other rheumatoid arthritis with rheumatoid factor of multiple sites: Secondary | ICD-10-CM | POA: Diagnosis not present

## 2023-08-03 DIAGNOSIS — M0589 Other rheumatoid arthritis with rheumatoid factor of multiple sites: Secondary | ICD-10-CM | POA: Diagnosis not present

## 2023-08-03 DIAGNOSIS — K50818 Crohn's disease of both small and large intestine with other complication: Secondary | ICD-10-CM | POA: Diagnosis not present

## 2023-09-20 DIAGNOSIS — G894 Chronic pain syndrome: Secondary | ICD-10-CM | POA: Diagnosis not present

## 2023-09-20 DIAGNOSIS — Z79899 Other long term (current) drug therapy: Secondary | ICD-10-CM | POA: Diagnosis not present

## 2023-09-20 DIAGNOSIS — M069 Rheumatoid arthritis, unspecified: Secondary | ICD-10-CM | POA: Diagnosis not present

## 2023-09-28 DIAGNOSIS — K50818 Crohn's disease of both small and large intestine with other complication: Secondary | ICD-10-CM | POA: Diagnosis not present

## 2023-09-28 DIAGNOSIS — M0589 Other rheumatoid arthritis with rheumatoid factor of multiple sites: Secondary | ICD-10-CM | POA: Diagnosis not present

## 2023-11-10 DIAGNOSIS — H5711 Ocular pain, right eye: Secondary | ICD-10-CM | POA: Diagnosis not present

## 2023-11-21 DIAGNOSIS — L209 Atopic dermatitis, unspecified: Secondary | ICD-10-CM | POA: Diagnosis not present

## 2023-11-21 DIAGNOSIS — Z79899 Other long term (current) drug therapy: Secondary | ICD-10-CM | POA: Diagnosis not present

## 2023-11-23 DIAGNOSIS — R5383 Other fatigue: Secondary | ICD-10-CM | POA: Diagnosis not present

## 2023-11-23 DIAGNOSIS — M0589 Other rheumatoid arthritis with rheumatoid factor of multiple sites: Secondary | ICD-10-CM | POA: Diagnosis not present

## 2023-11-23 DIAGNOSIS — K50818 Crohn's disease of both small and large intestine with other complication: Secondary | ICD-10-CM | POA: Diagnosis not present

## 2023-11-23 DIAGNOSIS — Z111 Encounter for screening for respiratory tuberculosis: Secondary | ICD-10-CM | POA: Diagnosis not present

## 2023-11-23 DIAGNOSIS — Z79899 Other long term (current) drug therapy: Secondary | ICD-10-CM | POA: Diagnosis not present

## 2023-11-24 DIAGNOSIS — H2513 Age-related nuclear cataract, bilateral: Secondary | ICD-10-CM | POA: Diagnosis not present

## 2023-11-24 DIAGNOSIS — H16201 Unspecified keratoconjunctivitis, right eye: Secondary | ICD-10-CM | POA: Diagnosis not present

## 2023-11-24 DIAGNOSIS — H16141 Punctate keratitis, right eye: Secondary | ICD-10-CM | POA: Diagnosis not present

## 2023-11-30 DIAGNOSIS — H16141 Punctate keratitis, right eye: Secondary | ICD-10-CM | POA: Diagnosis not present

## 2023-12-13 DIAGNOSIS — H9193 Unspecified hearing loss, bilateral: Secondary | ICD-10-CM | POA: Diagnosis not present

## 2023-12-13 DIAGNOSIS — Z974 Presence of external hearing-aid: Secondary | ICD-10-CM | POA: Diagnosis not present

## 2023-12-14 DIAGNOSIS — H16141 Punctate keratitis, right eye: Secondary | ICD-10-CM | POA: Diagnosis not present

## 2023-12-21 DIAGNOSIS — M069 Rheumatoid arthritis, unspecified: Secondary | ICD-10-CM | POA: Diagnosis not present

## 2023-12-21 DIAGNOSIS — G894 Chronic pain syndrome: Secondary | ICD-10-CM | POA: Diagnosis not present
